# Patient Record
Sex: Female | Born: 1965
Health system: Southern US, Community
[De-identification: ages and names within clinical notes are randomized; demographics above are authoritative.]

## PROBLEM LIST (undated history)

## (undated) DIAGNOSIS — I7300 Raynaud's syndrome without gangrene: Secondary | ICD-10-CM

## (undated) DIAGNOSIS — Z8619 Personal history of other infectious and parasitic diseases: Secondary | ICD-10-CM

## (undated) DIAGNOSIS — Z0000 Encounter for general adult medical examination without abnormal findings: Secondary | ICD-10-CM

## (undated) DIAGNOSIS — E7800 Pure hypercholesterolemia, unspecified: Secondary | ICD-10-CM

## (undated) DIAGNOSIS — R112 Nausea with vomiting, unspecified: Secondary | ICD-10-CM

## (undated) DIAGNOSIS — G4700 Insomnia, unspecified: Secondary | ICD-10-CM

## (undated) DIAGNOSIS — N6092 Unspecified benign mammary dysplasia of left breast: Secondary | ICD-10-CM

## (undated) DIAGNOSIS — G4730 Sleep apnea, unspecified: Secondary | ICD-10-CM

## (undated) DIAGNOSIS — M722 Plantar fascial fibromatosis: Secondary | ICD-10-CM

## (undated) DIAGNOSIS — R011 Cardiac murmur, unspecified: Secondary | ICD-10-CM

## (undated) DIAGNOSIS — B001 Herpesviral vesicular dermatitis: Secondary | ICD-10-CM

## (undated) DIAGNOSIS — R202 Paresthesia of skin: Secondary | ICD-10-CM

## (undated) DIAGNOSIS — Z9889 Other specified postprocedural states: Secondary | ICD-10-CM

## (undated) DIAGNOSIS — M545 Low back pain: Secondary | ICD-10-CM

## (undated) HISTORY — DX: Encounter for general adult medical examination without abnormal findings: Z00.00

## (undated) HISTORY — PX: OTHER SURGICAL HISTORY: SHX169

## (undated) HISTORY — DX: Raynaud's syndrome without gangrene: I73.00

## (undated) HISTORY — DX: Plantar fascial fibromatosis: M72.2

## (undated) HISTORY — PX: BREAST EXCISIONAL BIOPSY: SUR124

## (undated) HISTORY — DX: Sleep apnea, unspecified: G47.30

## (undated) HISTORY — DX: Insomnia, unspecified: G47.00

## (undated) HISTORY — DX: Unspecified benign mammary dysplasia of left breast: N60.92

## (undated) HISTORY — DX: Herpesviral vesicular dermatitis: B00.1

## (undated) HISTORY — DX: Cardiac murmur, unspecified: R01.1

## (undated) HISTORY — DX: Paresthesia of skin: R20.2

## (undated) HISTORY — DX: Low back pain: M54.5

## (undated) HISTORY — DX: Personal history of other infectious and parasitic diseases: Z86.19

## (undated) HISTORY — DX: Pure hypercholesterolemia, unspecified: E78.00

---
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
## 1997-11-01 ENCOUNTER — Inpatient Hospital Stay (HOSPITAL_COMMUNITY): Admission: AD | Admit: 1997-11-01 | Discharge: 1997-11-01 | Payer: Self-pay | Admitting: Obstetrics and Gynecology

## 1998-01-15 ENCOUNTER — Ambulatory Visit (HOSPITAL_COMMUNITY): Admission: RE | Admit: 1998-01-15 | Discharge: 1998-01-15 | Payer: Self-pay | Admitting: Obstetrics and Gynecology

## 1998-03-09 ENCOUNTER — Inpatient Hospital Stay (HOSPITAL_COMMUNITY): Admission: AD | Admit: 1998-03-09 | Discharge: 1998-03-14 | Payer: Self-pay | Admitting: Obstetrics and Gynecology

## 1998-03-27 ENCOUNTER — Encounter (HOSPITAL_COMMUNITY): Admission: RE | Admit: 1998-03-27 | Discharge: 1998-06-25 | Payer: Self-pay | Admitting: Obstetrics and Gynecology

## 1998-08-26 ENCOUNTER — Ambulatory Visit (HOSPITAL_COMMUNITY): Admission: RE | Admit: 1998-08-26 | Discharge: 1998-08-26 | Payer: Self-pay | Admitting: Obstetrics and Gynecology

## 1998-08-26 ENCOUNTER — Encounter: Payer: Self-pay | Admitting: Obstetrics and Gynecology

## 1999-07-23 ENCOUNTER — Other Ambulatory Visit: Admission: RE | Admit: 1999-07-23 | Discharge: 1999-07-23 | Payer: Self-pay | Admitting: Obstetrics and Gynecology

## 2000-08-28 ENCOUNTER — Other Ambulatory Visit: Admission: RE | Admit: 2000-08-28 | Discharge: 2000-08-28 | Payer: Self-pay | Admitting: Obstetrics and Gynecology

## 2000-09-07 ENCOUNTER — Encounter: Payer: Self-pay | Admitting: Obstetrics and Gynecology

## 2000-09-07 ENCOUNTER — Ambulatory Visit (HOSPITAL_COMMUNITY): Admission: RE | Admit: 2000-09-07 | Discharge: 2000-09-07 | Payer: Self-pay | Admitting: Obstetrics and Gynecology

## 2001-12-11 ENCOUNTER — Other Ambulatory Visit: Admission: RE | Admit: 2001-12-11 | Discharge: 2001-12-11 | Payer: Self-pay | Admitting: Obstetrics and Gynecology

## 2003-05-15 ENCOUNTER — Encounter: Admission: RE | Admit: 2003-05-15 | Discharge: 2003-05-15 | Payer: Self-pay | Admitting: Internal Medicine

## 2003-09-24 ENCOUNTER — Encounter (INDEPENDENT_AMBULATORY_CARE_PROVIDER_SITE_OTHER): Payer: Self-pay | Admitting: *Deleted

## 2003-11-06 ENCOUNTER — Other Ambulatory Visit: Admission: RE | Admit: 2003-11-06 | Discharge: 2003-11-06 | Payer: Self-pay | Admitting: Obstetrics and Gynecology

## 2004-12-07 ENCOUNTER — Other Ambulatory Visit: Admission: RE | Admit: 2004-12-07 | Discharge: 2004-12-07 | Payer: Self-pay | Admitting: Obstetrics and Gynecology

## 2005-04-05 ENCOUNTER — Ambulatory Visit: Payer: Self-pay | Admitting: Internal Medicine

## 2005-04-07 ENCOUNTER — Ambulatory Visit (HOSPITAL_BASED_OUTPATIENT_CLINIC_OR_DEPARTMENT_OTHER): Admission: RE | Admit: 2005-04-07 | Discharge: 2005-04-07 | Payer: Self-pay | Admitting: Podiatry

## 2005-04-07 ENCOUNTER — Ambulatory Visit (HOSPITAL_COMMUNITY): Admission: RE | Admit: 2005-04-07 | Discharge: 2005-04-07 | Payer: Self-pay | Admitting: Podiatry

## 2005-08-19 ENCOUNTER — Encounter: Admission: RE | Admit: 2005-08-19 | Discharge: 2005-08-19 | Payer: Self-pay | Admitting: Obstetrics and Gynecology

## 2005-09-05 ENCOUNTER — Encounter: Admission: RE | Admit: 2005-09-05 | Discharge: 2005-09-05 | Payer: Self-pay | Admitting: Obstetrics and Gynecology

## 2005-11-29 ENCOUNTER — Encounter: Admission: RE | Admit: 2005-11-29 | Discharge: 2006-01-04 | Payer: Self-pay | Admitting: Podiatry

## 2005-12-02 ENCOUNTER — Ambulatory Visit: Payer: Self-pay | Admitting: Family Medicine

## 2006-08-11 ENCOUNTER — Ambulatory Visit: Payer: Self-pay | Admitting: Internal Medicine

## 2006-08-11 ENCOUNTER — Encounter (INDEPENDENT_AMBULATORY_CARE_PROVIDER_SITE_OTHER): Payer: Self-pay | Admitting: *Deleted

## 2006-08-11 LAB — CONVERTED CEMR LAB
AST: 26 units/L (ref 0–37)
Albumin: 4.5 g/dL (ref 3.5–5.2)
CO2: 29 meq/L (ref 19–32)
Chloride: 105 meq/L (ref 96–112)
Cholesterol: 185 mg/dL (ref 0–200)
Creatinine, Ser: 0.8 mg/dL (ref 0.4–1.2)
Eosinophils Relative: 1.5 % (ref 0.0–5.0)
GFR calc Af Amer: 101 mL/min
GFR calc non Af Amer: 84 mL/min
Glucose, Bld: 94 mg/dL (ref 70–99)
HDL: 78.7 mg/dL (ref >39.0)
Hemoglobin: 14.3 g/dL (ref 12.0–15.0)
MCHC: 33.3 g/dL (ref 30.0–36.0)
Monocytes Relative: 11 % (ref 3.0–11.0)
Neutrophils Relative %: 65.7 % (ref 43.0–77.0)
Platelets: 240 10*3/uL (ref 150–400)
Potassium: 3.9 meq/L (ref 3.5–5.1)
RBC count: 4.69 10*6/uL
Sodium: 140 meq/L (ref 135–145)
TSH: 0.74 microintl units/mL
TSH: 0.74 microintl units/mL (ref 0.35–5.50)
Total Bilirubin: 1.1 mg/dL (ref 0.3–1.2)
Total CHOL/HDL Ratio: 2.4
Total Protein: 7.8 g/dL (ref 6.0–8.3)
Triglycerides: 53 mg/dL (ref 0–149)
WBC, blood: 5.2 10*3/uL
WBC: 5.2 10*3/uL (ref 4.5–10.5)

## 2006-09-13 ENCOUNTER — Ambulatory Visit (HOSPITAL_COMMUNITY): Admission: RE | Admit: 2006-09-13 | Discharge: 2006-09-13 | Payer: Self-pay | Admitting: Obstetrics and Gynecology

## 2007-10-03 ENCOUNTER — Ambulatory Visit (HOSPITAL_COMMUNITY): Admission: RE | Admit: 2007-10-03 | Discharge: 2007-10-03 | Payer: Self-pay | Admitting: Obstetrics and Gynecology

## 2007-11-29 ENCOUNTER — Encounter (INDEPENDENT_AMBULATORY_CARE_PROVIDER_SITE_OTHER): Payer: Self-pay | Admitting: *Deleted

## 2007-11-29 DIAGNOSIS — I7300 Raynaud's syndrome without gangrene: Secondary | ICD-10-CM

## 2007-11-29 DIAGNOSIS — E7800 Pure hypercholesterolemia, unspecified: Secondary | ICD-10-CM

## 2007-11-29 DIAGNOSIS — Z8679 Personal history of other diseases of the circulatory system: Secondary | ICD-10-CM | POA: Insufficient documentation

## 2007-11-29 DIAGNOSIS — Z9089 Acquired absence of other organs: Secondary | ICD-10-CM

## 2007-11-29 DIAGNOSIS — M722 Plantar fascial fibromatosis: Secondary | ICD-10-CM

## 2007-11-30 ENCOUNTER — Ambulatory Visit: Payer: Self-pay | Admitting: Internal Medicine

## 2007-11-30 DIAGNOSIS — F411 Generalized anxiety disorder: Secondary | ICD-10-CM

## 2007-11-30 DIAGNOSIS — G4720 Circadian rhythm sleep disorder, unspecified type: Secondary | ICD-10-CM | POA: Insufficient documentation

## 2007-12-03 LAB — CONVERTED CEMR LAB
Free T4: 1 ng/dL (ref 0.6–1.6)
TSH: 1.21 microintl units/mL (ref 0.35–5.50)

## 2008-01-16 ENCOUNTER — Telehealth (INDEPENDENT_AMBULATORY_CARE_PROVIDER_SITE_OTHER): Payer: Self-pay | Admitting: *Deleted

## 2008-01-16 ENCOUNTER — Ambulatory Visit: Payer: Self-pay | Admitting: Internal Medicine

## 2008-04-14 ENCOUNTER — Ambulatory Visit: Payer: Self-pay | Admitting: Internal Medicine

## 2008-04-28 ENCOUNTER — Encounter (INDEPENDENT_AMBULATORY_CARE_PROVIDER_SITE_OTHER): Payer: Self-pay | Admitting: *Deleted

## 2008-10-08 ENCOUNTER — Ambulatory Visit (HOSPITAL_COMMUNITY): Admission: RE | Admit: 2008-10-08 | Discharge: 2008-10-08 | Payer: Self-pay | Admitting: Obstetrics and Gynecology

## 2009-03-02 ENCOUNTER — Telehealth (INDEPENDENT_AMBULATORY_CARE_PROVIDER_SITE_OTHER): Payer: Self-pay | Admitting: *Deleted

## 2009-03-03 ENCOUNTER — Ambulatory Visit: Payer: Self-pay | Admitting: Internal Medicine

## 2009-03-03 DIAGNOSIS — R5381 Other malaise: Secondary | ICD-10-CM

## 2009-03-03 DIAGNOSIS — R5383 Other fatigue: Secondary | ICD-10-CM

## 2009-03-03 DIAGNOSIS — R002 Palpitations: Secondary | ICD-10-CM | POA: Insufficient documentation

## 2009-03-09 ENCOUNTER — Encounter (INDEPENDENT_AMBULATORY_CARE_PROVIDER_SITE_OTHER): Payer: Self-pay | Admitting: *Deleted

## 2009-09-29 ENCOUNTER — Ambulatory Visit: Payer: Self-pay | Admitting: Family Medicine

## 2009-09-29 DIAGNOSIS — B354 Tinea corporis: Secondary | ICD-10-CM | POA: Insufficient documentation

## 2009-10-14 ENCOUNTER — Encounter: Admission: RE | Admit: 2009-10-14 | Discharge: 2009-10-14 | Payer: Self-pay | Admitting: Obstetrics and Gynecology

## 2010-01-15 ENCOUNTER — Telehealth (INDEPENDENT_AMBULATORY_CARE_PROVIDER_SITE_OTHER): Payer: Self-pay | Admitting: *Deleted

## 2010-08-15 LAB — CONVERTED CEMR LAB
AST: 25 units/L (ref 0–37)
Albumin: 4.5 g/dL (ref 3.5–5.2)
BUN: 12 mg/dL (ref 6–23)
Basophils Absolute: 0.1 10*3/uL (ref 0.0–0.1)
Basophils Relative: 1.4 % (ref 0.0–3.0)
Bilirubin, Direct: 0.2 mg/dL (ref 0.0–0.3)
Chloride: 102 meq/L (ref 96–112)
Cholesterol: 173 mg/dL (ref 0–200)
Eosinophils Absolute: 0.1 10*3/uL (ref 0.0–0.7)
Eosinophils Relative: 1.2 % (ref 0.0–5.0)
GFR calc non Af Amer: 72 mL/min
Glucose, Bld: 92 mg/dL (ref 70–99)
HCT: 44.1 % (ref 36.0–46.0)
HDL: 74 mg/dL (ref >39.0)
Hemoglobin: 14.7 g/dL (ref 12.0–15.0)
LDL Cholesterol: 89 mg/dL (ref 0–99)
Lymphocytes Relative: 15.9 % (ref 12.0–46.0)
Lymphocytes Relative: 17.9 % (ref 12.0–46.0)
Lymphs Abs: 1.2 10*3/uL (ref 0.7–4.0)
MCHC: 33.2 g/dL (ref 30.0–36.0)
MCHC: 35 g/dL (ref 30.0–36.0)
Monocytes Absolute: 0.7 10*3/uL (ref 0.1–1.0)
Monocytes Relative: 9.8 % (ref 3.0–12.0)
Neutro Abs: 3.8 10*3/uL (ref 1.4–7.7)
Neutro Abs: 4.7 10*3/uL (ref 1.4–7.7)
Neutrophils Relative %: 70.8 % (ref 43.0–77.0)
Neutrophils Relative %: 70.9 % (ref 43.0–77.0)
RBC: 4.43 M/uL (ref 3.87–5.11)
RDW: 11.5 % (ref 11.5–14.6)
TSH: 0.7 microintl units/mL (ref 0.35–5.50)
Total Bilirubin: 1.2 mg/dL (ref 0.3–1.2)
Total Protein: 7.6 g/dL (ref 6.0–8.3)
VLDL: 10 mg/dL (ref 0–40)
WBC: 5.4 10*3/uL (ref 4.5–10.5)
WBC: 6.7 10*3/uL (ref 4.5–10.5)

## 2010-08-17 NOTE — Progress Notes (Signed)
Summary: spot on leg  Phone Note Call from Patient Call back at [phone]   Caller: Patient Summary of Call: pt was seen back in march for a spot on her leg. she was given cream and that seems to be helping her for a little while. she says that the spot will go away for a couple of days and then come back. pt wants to know if she can have the cream refilled of if she needs and office visit. she will be going [place] of town on [place] so she needs to know something today. Initial call taken by: Lavell Islam,  January 15, 2010 8:49 AM  Follow-up for Phone Call        ok to refill cream- will need OV upon return from her trip Follow-up by: Neena Rhymes MD,  January 15, 2010 3:21 PM  Additional Follow-up for Phone Call Additional follow up Details #1::        [facility] at [facility] [address] and [facility] for patient to pick up. Additional Follow-up by: Barnie Mort,  January 15, 2010 4:04 PM    Prescriptions: LOTRISONE 1-0.05 % CREA (CLOTRIMAZOLE-BETAMETHASONE) apply two times a day  #30g x 0   Entered by:   Doristine Devoid   Authorized by:   Neena Rhymes MD   Signed by:   Doristine Devoid on 01/15/2010   Method used:   Historical   RxID:   [id number]

## 2010-08-17 NOTE — Assessment & Plan Note (Signed)
Summary: spot on left thigh area//spider bite??//lch   Vital Signs:  Patient profile:   45 year old female Height:      65.25 inches Weight:      132 pounds BMI:     21.88 Temp:     98.1 degrees F oral Pulse rate:   72 / minute Pulse rhythm:   regular BP sitting:   122 / 80  (left arm) Cuff size:   regular  Vitals Entered By: Army Fossa CMA (September 29, 2009 4:05 PM) CC: Pt here for possible spider bite on her left thigh that itches, noticied it last friday., Rash   History of Present Illness:  Rash      This is a 45 year old woman who presents with Rash.  The symptoms began 5 days ago.  The patient complains of papules.  The rash is located on the left thigh.  The patient denies the following symptoms: fever, headache, facial swelling, tongue swelling, burning, difficulty breathing, abdominal pain, nausea, vomiting, diarrhea, dizziness, sore throat, dysuria, eye symptoms, arthralgias, and vaginal discharge.  The patient reports a history of recent travel.  Pt tried otc fungal cream with no relief.     Current Medications (verified): 1)  Aleve 220 Mg  Tabs (Naproxen Sodium) .Marland Kitchen.. 1 By Mouth As Needed 2)  Ambien Cr 12.5 Mg  Tbcr (Zolpidem Tartrate) .... 1/2 Npill Q 3rd Night Prn 3)  Lotrisone 1-0.05 % Crea (Clotrimazole-Betamethasone) .... Apply Two Times A Day 4)  Doxycycline Hyclate 100 Mg Caps (Doxycycline Hyclate) .Marland Kitchen.. 1 By Mouth Two Times A Day  Allergies: 1)  ! Percocet 2)  ! Vicodin  Past History:  Past medical, surgical, family and social histories (including risk factors) reviewed for relevance to current acute and chronic problems.  Past Medical History: Reviewed history from 03/03/2009 and no changes required. 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 Caesarean section; G1  P1 TONSILLECTOMY AND ADENOIDECTOMY, HX OF (ICD-V45.79) Uterine Fibroidectomy @ C section; Surgery for Plantar Fasciitis  Family History: Reviewed history from 03/03/2009 and no changes required. patient adopted  maternal uncle died  @ age 9 MI maternal grandmother cervical cancer, pacemaker,CHF mother cva brother diabetes ; 1/2 bro depression  Social History: Reviewed history from 03/03/2009 and no changes required. Former Smoker :quit 1993 Alcohol use-yes occasional Occupation: Palliative Care Nurse Regular exercise-yes: [employer]  Review of Systems      See HPI  Physical Exam  General:  Well-developed,well-nourished,in no acute distress; alert,appropriate and cooperative throughout examination Skin:  circular errythema with central clearing  Psych:  Oriented X3 and normally interactive.     Impression & Recommendations:  Problem # 1:  TINEA CORPORIS (ICD-110.5) lotrisone two times a day  call or rto if does not resolve  Complete Medication List: 1)  Aleve 220 Mg Tabs (Naproxen sodium) .Marland Kitchen.. 1 by mouth as needed 2)  Ambien Cr 12.5 Mg Tbcr (Zolpidem tartrate) .... 1/2 npill q 3rd night prn 3)  Lotrisone 1-0.05 % Crea (Clotrimazole-betamethasone) .... Apply two times a day 4)  Doxycycline Hyclate 100 Mg Caps (Doxycycline hyclate) .Marland Kitchen.. 1 by mouth two times a day Prescriptions: DOXYCYCLINE HYCLATE 100 MG CAPS (DOXYCYCLINE HYCLATE) 1 by mouth two times a day  #20 x 0   Entered and Authorized by:   Loreen Freud DO   Signed by:   Loreen Freud DO on 09/29/2009  Method used:   Print then Give to Patient   RxID:   [id number] LOTRISONE 1-0.05 % CREA (CLOTRIMAZOLE-BETAMETHASONE) apply two times a day  #30g x 0   Entered and Authorized by:   Loreen Freud DO   Signed by:   Loreen Freud DO on 09/29/2009   Method used:   Electronically to        [facility] [address] (retail)       [address] [place], [place]  [address]       Ph:  [phone] or [phone]       Fax: [phone]   RxID:   [id number]

## 2010-11-11 ENCOUNTER — Other Ambulatory Visit: Payer: Self-pay | Admitting: Obstetrics and Gynecology

## 2010-11-11 DIAGNOSIS — Z1231 Encounter for screening mammogram for malignant neoplasm of breast: Secondary | ICD-10-CM

## 2010-11-25 ENCOUNTER — Ambulatory Visit
Admission: RE | Admit: 2010-11-25 | Discharge: 2010-11-25 | Disposition: A | Discharge status: Home / Self Care | Payer: 59 | Source: Ambulatory Visit | Attending: Obstetrics and Gynecology | Admitting: Obstetrics and Gynecology

## 2010-11-25 DIAGNOSIS — Z1231 Encounter for screening mammogram for malignant neoplasm of breast: Secondary | ICD-10-CM

## 2010-12-03 NOTE — Op Note (Signed)
NAMEEARLA, Katherine [name]                   ACCOUNT NO.:  [id number]   MEDICAL RECORD NO.:  [id number]          PATIENT TYPE:  OUT   LOCATION:  [facility]                         FACILITY:  [facility]   PHYSICIAN:  Ysidro Evert. Regal, D.P.M. DATE OF BIRTH:  1966-02-29   DATE OF PROCEDURE:  04/07/2005  DATE OF DISCHARGE:  04/07/2005                                 OPERATIVE REPORT   PREOPERATIVE DIAGNOSIS:  Plantar fasciitis, right heel.   POSTOPERATIVE DIAGNOSIS:  Plantar fasciitis, right heel.   ANESTHESIA:  IV sedation with local infiltration.   HEMOSTASIS:  Ankle tourniquet, right.   PROCEDURE:  Endoscopic release of medial fascial band, right.   SURGEON:  Ysidro Evert. Regal, D.P.M.   DESCRIPTION OF PROCEDURE:  The patient was brought to the operating room and  placed in the supine position on the operating room table.  The patient was  injected with a total of 3 mL of Xylocaine for a PT block and 10 mL of  Xylocaine and Marcaine mixture for a regional block to the area.  The  patient's right foot was prepped and draped utilizing the standard aseptic  technique.  The right tourniquet was inflated to 250 mmHg and the following  procedure was performed.  An endoscopic release of the medial fascial band,  right.   Attention was directed to the dorsal aspect of the right foot where an  approximately 2 cm linear incision was made for entry of the cannula on the  medial side.  The area underneath the plantar fascia was completely cleaned  and the cannula was introduced from the medial portal and then a lateral  entry point was allowed with a 1 mL incision to allow a lateral entry point  for the portal.  The 70-degree camera was inserted into the area and the  plantar fascia was visualized.  Utilizing a triangular blade from the medial  direction, the medial one-third of the plantar fascia was transected  utilizing the camera as a guide.  The wound was flushed with copious sterile  antibiotic solution.   There was found to be muscle belly exposure,  indicating a complete transection of the plantar fascia on the medial one-  third.  The cannula was removed and the wound was flushed with copious  Garamycin solution.  The wound edges were reapproximated utilizing #4-0  nylon and a sterile dressing was applied, after 5 mL of Xylocaine 1 mL and  dexamethasone was injected into the operative site.   The patient tolerated the surgery and anesthesia well and was returned from  the operating room in satisfactory condition, and was discharged by the  department of anesthesia.           ______________________________  Ysidro Evert. Regal, D.P.M.     NSR/MEDQ  D:  06/02/2005  T:  06/03/2005  Job:  [id number]

## 2011-01-18 ENCOUNTER — Encounter: Payer: Self-pay | Admitting: Internal Medicine

## 2011-01-18 ENCOUNTER — Ambulatory Visit (INDEPENDENT_AMBULATORY_CARE_PROVIDER_SITE_OTHER): Payer: 59 | Admitting: Internal Medicine

## 2011-01-18 ENCOUNTER — Encounter: Payer: Self-pay | Admitting: *Deleted

## 2011-01-18 ENCOUNTER — Telehealth: Payer: Self-pay | Admitting: *Deleted

## 2011-01-18 VITALS — BP 124/82 | HR 73 | Wt 127.6 lb

## 2011-01-18 DIAGNOSIS — B354 Tinea corporis: Secondary | ICD-10-CM

## 2011-01-18 DIAGNOSIS — R002 Palpitations: Secondary | ICD-10-CM

## 2011-01-18 DIAGNOSIS — R0789 Other chest pain: Secondary | ICD-10-CM

## 2011-01-18 DIAGNOSIS — R110 Nausea: Secondary | ICD-10-CM

## 2011-01-18 LAB — CBC WITH DIFFERENTIAL/PLATELET
Basophils Absolute: 0 10*3/uL (ref 0.0–0.1)
Eosinophils Relative: 0.5 % (ref 0.0–5.0)
MCV: 92.7 fl (ref 78.0–100.0)
Monocytes Absolute: 0.6 10*3/uL (ref 0.1–1.0)
Neutrophils Relative %: 75 % (ref 43.0–77.0)
RBC: 4.31 Mil/uL (ref 3.87–5.11)
RDW: 12.9 % (ref 11.5–14.6)
WBC: 6.3 10*3/uL (ref 4.5–10.5)

## 2011-01-18 LAB — COMPREHENSIVE METABOLIC PANEL
Alkaline Phosphatase: 56 U/L (ref 39–117)
Calcium: 9.3 mg/dL (ref 8.4–10.5)
Creatinine, Ser: 0.8 mg/dL (ref 0.4–1.2)
GFR: 77.27 mL/min (ref >60.00)
Glucose, Bld: 69 mg/dL — ABNORMAL LOW (ref 70–99)
Total Bilirubin: 0.5 mg/dL (ref 0.3–1.2)
Total Protein: 7.4 g/dL (ref 6.0–8.3)

## 2011-01-18 LAB — POCT URINALYSIS DIPSTICK
Bilirubin, UA: NEGATIVE
Glucose, UA: NEGATIVE
Ketones, UA: NEGATIVE
Nitrite, UA: NEGATIVE
Protein, UA: NEGATIVE
Urobilinogen, UA: 0.2

## 2011-01-18 LAB — TSH: TSH: 0.79 u[IU]/mL (ref 0.35–5.50)

## 2011-01-18 MED ORDER — ZOLPIDEM TARTRATE ER 12.5 MG PO TBCR: 12.5000 mg | EXTENDED_RELEASE_TABLET | Freq: Every evening | ORAL | Status: DC | PRN

## 2011-01-18 MED ORDER — CLOTRIMAZOLE-BETAMETHASONE 1-0.05 % EX CREA: 1.0000 "application " | TOPICAL_CREAM | Freq: Two times a day (BID) | CUTANEOUS | Status: DC

## 2011-01-18 NOTE — Assessment & Plan Note (Signed)
Skin lesion in the left leg, continue with Lotrisone, reassess in 2 weeks by PCP

## 2011-01-18 NOTE — Telephone Encounter (Signed)
Dr Ladona Ridgel read EKG- states he has no recommendations.

## 2011-01-18 NOTE — Progress Notes (Signed)
  Subjective:    Patient ID: Katherine [name], female    DOB: 1965/10/07, 45 y.o.   MRN: [id number]  HPI On 01-16-11 she felt tired, slightly dizzy, had mild nausea and a very mild shortness of breath when she walked at the  parking lot during the hot time of the day. She decided to leave work and went home . The next day, she felt slightly better but not 100%. At night she developed some palpitations described as her heart fluttering, she has about 3 episodes that lasted 20 seconds;  no syncope. Today, she still feel slightly nauseous and dizzy; she did have a single, 1 minute episode, of chest pressure, this morning rated in the scale from 0-10 as a #2. No associated symptoms, no radiation. 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 She was also recently diagnosed with premenopausal syndrome, was Rx  HRT but has not started it yet.  Past Medical History  Diagnosis Date  . Raynaud's syndrome   . Pure hypercholesterolemia   . Heart murmur   . Plantar fasciitis     Past Surgical History  Procedure Date  . Cesarean section   . Tonsillectomy and adenoidectomy   . Uterine fibroidectomy     @ c section  . Plantar fasciitis surgery      Social History: Former Smoker :quit 1993 Alcohol use-yes occasional Occupation: Palliative Care Nurse Regular exercise-yes: [employer]  Do  Review of Systems Mild chills without fever No  edema, recent airplane trips or leg pain. Nor rash anywhere except for few mosquito bites in the  legs and a rash that has not improved much with the use of Lotrisone. Mild headache today Denies fever, cough or sputum production. No pre-syncope     Objective:   Physical Exam  Constitutional: She is oriented to person, place, and time. She appears well-developed  and well-nourished. No distress.  HENT:  Head: Normocephalic and atraumatic.  Neck: No thyromegaly present.  Cardiovascular: Normal rate, regular rhythm and normal heart sounds.   No murmur heard. Pulmonary/Chest: Effort normal and breath sounds normal. No respiratory distress. She has no wheezes. She has no rales.  Abdominal:       Nondistended, soft, no mass, slightly tender in the lower abdomen bilaterally without mass or rebound.  Musculoskeletal: She exhibits no edema.       Calves symmetric and not tender  Neurological: She is alert and oriented to person, place, and time. No cranial nerve deficit.       Speech, gait and motor are intact  Skin: Skin is warm and dry. She is not diaphoretic.  Psychiatric:       No distress, but seems worried about her symptoms          Assessment & Plan:

## 2011-01-18 NOTE — Assessment & Plan Note (Addendum)
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 The symptoms are in the setting of high stress at work, working 60 hours a week for several weeks and not been able to sleep well. EKG show sinus rhythm, will  discussed with cardiology. (EKG ok , see phone note) Lower abd dyscomfort---- neg UA Plan: Labs Echocardiogram ER if symptoms appear Discussed  Treatment for anxiety, not ready to SSRIs RF sleep meds -------> rec to use them (has not been using them) Followup with PCP in 2 weeks

## 2011-01-20 ENCOUNTER — Telehealth: Payer: Self-pay | Admitting: Internal Medicine

## 2011-01-20 DIAGNOSIS — R002 Palpitations: Secondary | ICD-10-CM

## 2011-01-20 NOTE — Telephone Encounter (Signed)
Pt called says that she was supposed to be set up w/ 2-D echo was just checking status says Dr. Drue Novel wanted it done as soon as possible. There's no order in computer so please clarify if pt is supposed to have echo done.

## 2011-01-20 NOTE — Telephone Encounter (Signed)
Per Staff message from Dr.Paz schedule ECHO dx palpitations.

## 2011-01-20 NOTE — Telephone Encounter (Signed)
Thank you :)

## 2011-01-21 ENCOUNTER — Telehealth: Payer: Self-pay | Admitting: *Deleted

## 2011-01-21 NOTE — Telephone Encounter (Signed)
Message copied by Leanne Lovely on Fri Jan 21, 2011  1:57 PM ------      Message from: Willow Ora E      Created: Fri Jan 21, 2011  1:47 PM       Advise patient, labs okay except for a slightly low blood sugar, plan is the same

## 2011-01-21 NOTE — Telephone Encounter (Signed)
Message left for patient to return my call.  

## 2011-01-24 NOTE — Telephone Encounter (Signed)
Pt is aware.  

## 2011-01-27 ENCOUNTER — Ambulatory Visit (HOSPITAL_COMMUNITY): Payer: 59 | Attending: Internal Medicine | Admitting: Radiology

## 2011-01-27 ENCOUNTER — Other Ambulatory Visit (HOSPITAL_COMMUNITY): Payer: Self-pay | Admitting: Internal Medicine

## 2011-01-27 DIAGNOSIS — R002 Palpitations: Secondary | ICD-10-CM

## 2011-01-27 DIAGNOSIS — R072 Precordial pain: Secondary | ICD-10-CM | POA: Insufficient documentation

## 2011-01-27 DIAGNOSIS — I059 Rheumatic mitral valve disease, unspecified: Secondary | ICD-10-CM | POA: Insufficient documentation

## 2011-01-27 DIAGNOSIS — R0789 Other chest pain: Secondary | ICD-10-CM

## 2011-01-27 DIAGNOSIS — I079 Rheumatic tricuspid valve disease, unspecified: Secondary | ICD-10-CM | POA: Insufficient documentation

## 2011-01-27 DIAGNOSIS — R011 Cardiac murmur, unspecified: Secondary | ICD-10-CM | POA: Insufficient documentation

## 2011-02-03 ENCOUNTER — Telehealth: Payer: Self-pay | Admitting: *Deleted

## 2011-02-03 ENCOUNTER — Telehealth: Payer: Self-pay | Admitting: Internal Medicine

## 2011-02-03 MED ORDER — LORAZEPAM 0.5 MG PO TABS: 0.5000 mg | ORAL_TABLET | Freq: Three times a day (TID) | ORAL | Status: AC

## 2011-02-03 NOTE — Telephone Encounter (Signed)
Pt aware of information.  

## 2011-02-03 NOTE — Telephone Encounter (Signed)
A safer alternative would be  Lorazepam  0.5 mg taken if she awakens. This would not have risk of addiction/habituation  and automated  behavior seen with Ambien. Dispense  # 30 as needed each night  If she awakens early. I feel her pain meds ; we've ben in [employer] 3 months

## 2011-02-03 NOTE — Telephone Encounter (Signed)
Message copied by Leanne Lovely on Thu Feb 03, 2011  1:48 PM ------      Message from: Willow Ora E      Created: Thu Feb 03, 2011 12:32 PM       Advise patient:      Echocardiogram is normal, plan is the same

## 2011-02-03 NOTE — Telephone Encounter (Signed)
Message left for patient to return my call.  

## 2011-02-03 NOTE — Telephone Encounter (Signed)
Spoke w/ pt says that she is doing all that recommended says that she is currently working w/ [employer] go live and has no trouble falling asleep but wakes up after 3-4 hours and has trouble going back due to stress of work and feels like mind doesn't shut off. Is willing to go to sleep specialist but a this time with work schedule working about 60 hours a week right now it will be impossible but will call when she does get a moment to where she can go informed that if any other suggestions will let her know.

## 2011-02-03 NOTE — Telephone Encounter (Signed)
Addended by: Doristine Devoid on: 02/03/2011 05:01 PM   Modules accepted: Orders

## 2011-02-03 NOTE — Telephone Encounter (Signed)
To prevent sleep dysfunction follow these instructions for sleep hygiene. Do not read, watch TV, or eat in bed. Do not get into bed until you are ready to turn off the light &  to go to sleep. Do not ingest stimulants ( decongestants, diet pills, nicotine, caffeine) after the evening meal.  If practicing these measures  &  a sleeping pill is still needed more than every 3 rd night, an  evaluation  should be completed by a sleep specialist ( Ex Dr Marylou Flesher) to rule out some other possibly significant underlying issue which would  require definitive treatment.

## 2011-02-03 NOTE — Telephone Encounter (Signed)
Pt aware of echo

## 2011-02-03 NOTE — Telephone Encounter (Signed)
Spoke w/ pt mention she was unable to get ambien refill because pharmacy stated that is was to early to filled last time she got rx filled they only filled it for 15 tabs when we sent in for 30 informed pt that this was due to insurance not anything that we indicated. Pt says she is having to take medication nightly says she has a lot going and having trouble sleeping would like to know if there's something else that would be ok for her to take nightly.

## 2011-12-28 ENCOUNTER — Other Ambulatory Visit (HOSPITAL_COMMUNITY): Payer: Self-pay | Admitting: Obstetrics and Gynecology

## 2011-12-28 DIAGNOSIS — Z1231 Encounter for screening mammogram for malignant neoplasm of breast: Secondary | ICD-10-CM

## 2012-01-20 ENCOUNTER — Ambulatory Visit (HOSPITAL_COMMUNITY)
Admission: RE | Admit: 2012-01-20 | Discharge: 2012-01-20 | Disposition: A | Discharge status: Home / Self Care | Payer: 59 | Source: Ambulatory Visit | Attending: Obstetrics and Gynecology | Admitting: Obstetrics and Gynecology

## 2012-01-20 DIAGNOSIS — Z1231 Encounter for screening mammogram for malignant neoplasm of breast: Secondary | ICD-10-CM | POA: Insufficient documentation

## 2012-05-21 ENCOUNTER — Ambulatory Visit (INDEPENDENT_AMBULATORY_CARE_PROVIDER_SITE_OTHER): Payer: 59 | Admitting: Internal Medicine

## 2012-05-21 ENCOUNTER — Encounter: Payer: Self-pay | Admitting: Internal Medicine

## 2012-05-21 VITALS — BP 104/66 | HR 57 | Wt 132.0 lb

## 2012-05-21 DIAGNOSIS — G479 Sleep disorder, unspecified: Secondary | ICD-10-CM

## 2012-05-21 DIAGNOSIS — M2550 Pain in unspecified joint: Secondary | ICD-10-CM

## 2012-05-21 MED ORDER — MELOXICAM 7.5 MG PO TABS: 7.5000 mg | ORAL_TABLET | Freq: Every day | ORAL | Status: DC

## 2012-05-21 MED ORDER — ZOLPIDEM TARTRATE ER 12.5 MG PO TBCR: EXTENDED_RELEASE_TABLET | ORAL | Status: DC

## 2012-05-21 NOTE — Progress Notes (Signed)
Subjective:    Patient ID: Katherine [name], female    DOB: 1966-04-13, 46 y.o.   MRN: [id number]  HPI #1 Extremity pain Location: Right hand and right hip Onset: right hand- 1 year ago. R hip- 1 month Trigger/injury: right hip trigger- no specific trigger.  Right hand- no injury. Right hand 2rd digit- triggers Pain quality: dull and constant in index & 2nd R fingers;pain in left thenar imminence occasionally Pain severity: 2/10 Duration: constant in fingers Exacerbating factors: right hip- walking for extended period Treatment/response: Ibuprofen/Advil with minimal relief. Glucosamine has not been of benefit. A supplement she she got from health food store has helped some  Significant past history includes some Raynaud's in the right hand chiefly.   #2 Sleep Dysfunction:   Onset: for several years Pattern: everyday  Difficulty going to sleep: no Frequent awakening:yes Early awakening:yes. Up at 2-3am Nightmares:no Abnormal leg movement: ? 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 Advil PM with decent relief but does not get more than 4 hours of sleep.   She does grind her teeth and has a prosthesis.      Review of Systems Constitutional: no fever, chills, sweats. 5# increase in weight due to intake of caffeine to stay up during daytime  Musculoskeletal:no  muscle cramps or pain. Some   joint  Redness & swelling of R index finger  Skin:no rash, color change Neuro: no weakness; incontinence (stool/urine);  numbness and tingling Heme:no lymphadenopathy; abnormal bruising or bleeding      Objective:   Physical Exam Gen.:  Thin but well-nourished in appearance. Alert, appropriate and cooperative throughout exam. Head: Normocephalic without obvious abnormalities Eyes: No corneal or conjunctival inflammation noted.  Extraocular motion intact. Vision grossly normal.  Nose: External nasal exam reveals no deformity or inflammation. Nasal mucosa are pink and moist. No lesions or exudates noted.  Mouth: Oral mucosa and oropharynx reveal no lesions or exudates. Teeth in good repair. Neck: No deformities, masses, or tenderness noted. Range of motion &. Thyroid  normal. Lungs: Normal respiratory effort; chest expands symmetrically. Lungs are clear to auscultation without rales, wheezes, or increased work of breathing. Heart: Normal rate and rhythm. Normal S1 and S2. No gallop, click, or rub. S4 w/o murmur. Abdomen: Bowel sounds normal; abdomen soft and nontender. No masses, organomegaly or hernias noted. Musculoskeletal/extremities: No deformity or scoliosis noted of  the thoracic or lumbar spine. No clubbing, cyanosis, edema, or deformity noted. Range of motion  normal .Tone & strength  normal.Joints normal. Nail health  Good. Minor knee crepitus , R > L Vascular: Carotid, radial artery, dorsalis pedis and  posterior tibial pulses are full and equal. No bruits present. Neurologic: Alert and oriented x3. Deep tendon reflexes symmetrical and normal.          Skin: Intact without suspicious lesions or rashes. Lymph: No cervical, axillary lymphadenopathy present. Psych: Mood and affect are normal. Normally interactive  Assessment & Plan:  #1 markedly asymmetric joint symptoms in the context of Raynaud's phenomena. Some triggering second right finger. Pain of the left thumb suggested degenerative joint changes.  #2  sleep dysfunction with snoring. Despite her body habitus; sleep apnea must be ruled out.  Plan: See orders and recommendations

## 2012-05-21 NOTE — Addendum Note (Signed)
Addended by: Maurice Small on: 05/21/2012 05:25 PM   Modules accepted: Orders

## 2012-05-21 NOTE — Patient Instructions (Addendum)
Review and correct the record as indicated. Please share record with all medical staff seen.  Please  schedule fasting Labs : sed rate, RA factor,hepatic panel, CBC & dif.PLEASE BRING THESE INSTRUCTIONS TO FOLLOW UP  LAB APPOINTMENT.This will guarantee correct labs are drawn, eliminating need for repeat blood sampling ( needle sticks ! ). Diagnoses /Codes: sleep disorder; arthragias

## 2012-05-23 ENCOUNTER — Other Ambulatory Visit (INDEPENDENT_AMBULATORY_CARE_PROVIDER_SITE_OTHER): Payer: 59

## 2012-05-23 DIAGNOSIS — G479 Sleep disorder, unspecified: Secondary | ICD-10-CM

## 2012-05-23 DIAGNOSIS — M2550 Pain in unspecified joint: Secondary | ICD-10-CM

## 2012-05-23 LAB — CBC WITH DIFFERENTIAL/PLATELET
Basophils Absolute: 0 10*3/uL (ref 0.0–0.1)
HCT: 41.3 % (ref 36.0–46.0)
Hemoglobin: 13.7 g/dL (ref 12.0–15.0)
Lymphs Abs: 0.9 10*3/uL (ref 0.7–4.0)
MCV: 92.9 fl (ref 78.0–100.0)
Monocytes Absolute: 0.5 10*3/uL (ref 0.1–1.0)
Neutro Abs: 3.3 10*3/uL (ref 1.4–7.7)
Platelets: 207 10*3/uL (ref 150.0–400.0)
RDW: 12.5 % (ref 11.5–14.6)

## 2012-05-23 LAB — HEPATIC FUNCTION PANEL
AST: 22 U/L (ref 0–37)
Alkaline Phosphatase: 51 U/L (ref 39–117)
Bilirubin, Direct: 0.1 mg/dL (ref 0.0–0.3)
Total Bilirubin: 0.7 mg/dL (ref 0.3–1.2)
Total Protein: 7.5 g/dL (ref 6.0–8.3)

## 2012-05-24 LAB — RHEUMATOID FACTOR: Rhuematoid fact SerPl-aCnc: 29 IU/mL — ABNORMAL HIGH (ref <=14)

## 2012-05-30 ENCOUNTER — Telehealth: Payer: Self-pay | Admitting: Internal Medicine

## 2012-05-30 ENCOUNTER — Encounter: Payer: Self-pay | Admitting: Internal Medicine

## 2012-05-30 NOTE — Telephone Encounter (Signed)
called to scheduled lm to call back & schedule lab work

## 2012-06-06 NOTE — Telephone Encounter (Signed)
pt called to scheduled coming in 11.21.13 at 10am

## 2012-06-07 ENCOUNTER — Other Ambulatory Visit (INDEPENDENT_AMBULATORY_CARE_PROVIDER_SITE_OTHER): Payer: 59

## 2012-06-07 DIAGNOSIS — M2550 Pain in unspecified joint: Secondary | ICD-10-CM

## 2012-06-07 DIAGNOSIS — G479 Sleep disorder, unspecified: Secondary | ICD-10-CM

## 2012-06-08 ENCOUNTER — Other Ambulatory Visit: Payer: Self-pay | Admitting: Internal Medicine

## 2012-06-08 DIAGNOSIS — M2550 Pain in unspecified joint: Secondary | ICD-10-CM

## 2012-06-08 LAB — CYCLIC CITRUL PEPTIDE ANTIBODY, IGG: Cyclic Citrullin Peptide Ab: <2 U/mL (ref 0.0–5.0)

## 2012-06-09 ENCOUNTER — Encounter: Payer: Self-pay | Admitting: Internal Medicine

## 2012-06-11 ENCOUNTER — Other Ambulatory Visit: Payer: Self-pay | Admitting: Obstetrics and Gynecology

## 2012-06-30 ENCOUNTER — Other Ambulatory Visit: Payer: Self-pay | Admitting: Internal Medicine

## 2012-07-13 ENCOUNTER — Telehealth: Payer: Self-pay | Admitting: Internal Medicine

## 2012-07-13 NOTE — Telephone Encounter (Signed)
Faxed labs

## 2012-07-13 NOTE — Telephone Encounter (Signed)
Fleet Contras called office (now sure which office but has something to do with arthritis) her fax number is [phone] - records were faxed over to here but they didn't include arthritis labs- she needs these faxed to her. Sorry cannot be more help, amym

## 2012-10-24 ENCOUNTER — Encounter: Payer: Self-pay | Admitting: Lab

## 2012-10-25 ENCOUNTER — Ambulatory Visit (INDEPENDENT_AMBULATORY_CARE_PROVIDER_SITE_OTHER): Payer: 59 | Admitting: Internal Medicine

## 2012-10-25 ENCOUNTER — Encounter: Payer: Self-pay | Admitting: Internal Medicine

## 2012-10-25 VITALS — BP 110/70 | HR 57 | Temp 97.9°F | Wt 131.0 lb

## 2012-10-25 DIAGNOSIS — L720 Epidermal cyst: Secondary | ICD-10-CM

## 2012-10-25 DIAGNOSIS — L723 Sebaceous cyst: Secondary | ICD-10-CM

## 2012-10-25 NOTE — Patient Instructions (Addendum)
Elective surgical resection of the lesion should be pursued if there is an increase in tenderness/pain, size, or evidence of  cellulitis.

## 2012-10-25 NOTE — Progress Notes (Signed)
  Subjective:    Patient ID: Katherine [name], female    DOB: 1965-10-23, 47 y.o.   MRN: [id number]  HPI   Approximately one month ago she noted a lesion at the right upper medial thigh while applying lotion to her leg. She believes that this has increased in size.  She's had some aching discomfort in the right leg of question significance.    Review of Systems   She denies associated fever, chills, sweats, weight loss     Objective:   Physical Exam  She is thin but appears healthy and well-nourished. She appears younger than stated age.  She has no lymphadenopathy about the neck, axilla, or right inguinal area  She has no abdominal tenderness, organomegaly, masses  There is a 4 x 5 mm movable subcutaneous lesion in the right upper medial thigh. This is not visible with transillumination. The lesion is not attached to the subcutaneous tissues.  Homans sign is negative.        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 If the lesion increases in size or has associated pain or cellulitis; elective resection should be pursued.

## 2012-12-18 ENCOUNTER — Encounter: Payer: 59 | Admitting: Internal Medicine

## 2013-01-04 ENCOUNTER — Other Ambulatory Visit: Payer: Self-pay

## 2013-01-04 MED ORDER — ZALEPLON 10 MG PO CAPS: 10.0000 mg | ORAL_CAPSULE | ORAL | Status: DC | PRN

## 2013-01-07 MED ORDER — ZALEPLON 10 MG PO CAPS: 10.0000 mg | ORAL_CAPSULE | ORAL | Status: DC | PRN

## 2013-01-07 NOTE — Addendum Note (Signed)
Addended by: Lucille Passy C on: 01/07/2013 12:39 PM   Modules accepted: Orders

## 2013-02-04 ENCOUNTER — Other Ambulatory Visit (HOSPITAL_COMMUNITY): Payer: Self-pay | Admitting: Obstetrics and Gynecology

## 2013-02-04 DIAGNOSIS — Z1231 Encounter for screening mammogram for malignant neoplasm of breast: Secondary | ICD-10-CM

## 2013-02-15 ENCOUNTER — Ambulatory Visit (HOSPITAL_COMMUNITY)
Admission: RE | Admit: 2013-02-15 | Discharge: 2013-02-15 | Disposition: A | Discharge status: Home / Self Care | Payer: 59 | Source: Ambulatory Visit | Attending: Obstetrics and Gynecology | Admitting: Obstetrics and Gynecology

## 2013-02-15 DIAGNOSIS — Z1231 Encounter for screening mammogram for malignant neoplasm of breast: Secondary | ICD-10-CM | POA: Insufficient documentation

## 2013-02-18 ENCOUNTER — Other Ambulatory Visit: Payer: Self-pay | Admitting: Obstetrics and Gynecology

## 2013-02-18 DIAGNOSIS — R928 Other abnormal and inconclusive findings on diagnostic imaging of breast: Secondary | ICD-10-CM

## 2013-02-19 ENCOUNTER — Ambulatory Visit (INDEPENDENT_AMBULATORY_CARE_PROVIDER_SITE_OTHER): Payer: 59 | Admitting: Internal Medicine

## 2013-02-19 ENCOUNTER — Encounter: Payer: Self-pay | Admitting: Internal Medicine

## 2013-02-19 VITALS — BP 118/80 | HR 62 | Temp 98.0°F | Resp 12 | Wt 134.0 lb

## 2013-02-19 DIAGNOSIS — L959 Vasculitis limited to the skin, unspecified: Secondary | ICD-10-CM

## 2013-02-19 DIAGNOSIS — N390 Urinary tract infection, site not specified: Secondary | ICD-10-CM

## 2013-02-19 DIAGNOSIS — R51 Headache: Secondary | ICD-10-CM

## 2013-02-19 DIAGNOSIS — L988 Other specified disorders of the skin and subcutaneous tissue: Secondary | ICD-10-CM

## 2013-02-19 LAB — POCT URINALYSIS DIPSTICK
Bilirubin, UA: NEGATIVE
Glucose, UA: NEGATIVE
Spec Grav, UA: <=1.005

## 2013-02-19 MED ORDER — PHENAZOPYRIDINE HCL 200 MG PO TABS: 200.0000 mg | ORAL_TABLET | Freq: Three times a day (TID) | ORAL | Status: DC | PRN

## 2013-02-19 MED ORDER — TRAMADOL HCL 50 MG PO TABS: 50.0000 mg | ORAL_TABLET | Freq: Four times a day (QID) | ORAL | Status: DC | PRN

## 2013-02-19 MED ORDER — NITROFURANTOIN MONOHYD MACRO 100 MG PO CAPS: 100.0000 mg | ORAL_CAPSULE | Freq: Two times a day (BID) | ORAL | Status: DC

## 2013-02-19 NOTE — Patient Instructions (Addendum)
Drink as much nondairy fluids as possible. Avoid spicy foods or alcohol as  these may aggravate the prostate. Do not take decongestants. Avoid narcotics if possible. Dip gauze in  sterile saline and applied to the wound twice a day. Cover the wound with Telfa , non stick dressing  without any antibiotic ointment. The saline can be purchased at the drugstore or you can make your own .Boil cup of salt in a gallon of water. Store mixture  in a clean container.Report Warning  signs as discussed (red streaks, pus, fever, increasing pain).

## 2013-02-19 NOTE — Progress Notes (Signed)
Subjective:    Patient ID: Katherine [name], female    DOB: 1966-06-21, 47 y.o.   MRN: [id number]  HPI   As of 02/18/13 she has had malaise. She also felt as if she needed to urinate without frank urgency. There was some mild dysuria which improved with hydration. She is unable to tell whether she is having hematuria as she is having her menses. She has had some suprapubic tenderness. She's also had chills and a sense of queasiness.  She describes some frontal, dull headache without radiation.  Additionally she was bitten by an unknown vector over the left lower [place] The lesions were red and itchy and only slightly responsive to triple antibiotic  ointments    Review of Systems  She has had no associated blurred vision, double vision, or loss of vision. She denies facial pain or nasal purulence.  The chills has not been associated with fever or sweats.     Objective:   Physical Exam Gen.: Thin but healthy and well-nourished in appearance. Alert, appropriate and cooperative throughout exam.Appears younger than stated age  Head: Normocephalic without obvious abnormalities  Eyes: No corneal or conjunctival inflammation noted. Pupils equal round reactive to light and accommodation. FOV WNL. Extraocular motion intact. Vision grossly normal without lenses Ears: External  ear exam reveals no significant lesions or deformities. Canals clear .TMs normal. Hearing is grossly normal bilaterally. Nose: External nasal exam reveals no deformity or inflammation. Nasal mucosa are pink and moist. No lesions or exudates noted.   Mouth: Oral mucosa and oropharynx reveal no lesions or exudates. Teeth in good repair. Neck: No deformities, masses, or tenderness noted. Range of motion & Thyroid normal. Neck supple to passive range of motion Lungs: Normal respiratory effort; chest expands symmetrically. Lungs are clear to auscultation without rales, wheezes, or  increased work of breathing. Heart: Normal rate and rhythm. Normal S1 and S2. No gallop, click, or rub. No murmur. Abdomen: Bowel sounds normal; abdomen soft and nontender. No masses, organomegaly or hernias noted.Aorta palpable ; no AAA                               Musculoskeletal/extremities:  No clubbing, cyanosis, edema, or significant extremity  deformity noted. Range of motion normal .Tone & strength  Normal. Joints normal. Nail health good. Able to lie down & sit up w/o help. Negative SLR bilaterally to 90 degrees. No meningeal signs Vascular: Carotid, radial artery, dorsalis pedis and  posterior tibial pulses are full and equal. No bruits present. Neurologic: Alert and oriented x3. Deep tendon reflexes symmetrical and normal.         Skin: 2 erythematous lesions LLE; superior lesion blanches with pressure Lymph: No cervical, axillary lymphadenopathy present. Psych: Mood and affect are normal. 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 Clinically there is no suggestion of tick borne disease. This may be related to using the Triple Antibiotic ointment  #3 headache, no neurologic deficit  Plan: See orders and recommendations

## 2013-02-20 ENCOUNTER — Telehealth: Payer: Self-pay | Admitting: Internal Medicine

## 2013-02-20 NOTE — Telephone Encounter (Signed)
Noted  

## 2013-02-20 NOTE — Telephone Encounter (Signed)
Patient Information:  Caller Name: Shalin  Phone: [phone]  Patient: Katherine [name]  Gender: Female  DOB: 1966-01-18  Age: 47 Years  PCP: Marga Melnick  Pregnant: No  Office Follow Up:  Does the office need to follow up with this patient?: No  Instructions For The Office: N/A  RN Note:  Triage RN called Macrobid as ordered by Dr Alwyn Ren on 02/19/13 to [facility]; pt aware  Symptoms  Reason For Call & Symptoms: LMP 02/15/13 Pt is calling and states that she was dx with UTI on 02/19/13 in the office;  Macrobid was to be called into [facility] [phone] but it was not there; Triage RN reviewed in EPIC and noted that Macrobid 100mg  1 capsule by mouth twice daily #14 NR was ordered on 02/19/13  Reviewed Health History In EMR: N/[name]  Reviewed Medications In EMR: N/[name]  Reviewed Allergies In EMR: N/[name]  Reviewed Surgeries / Procedures: N/[name]  Date of Onset of Symptoms: Unknown OB / GYN:  LMP: 02/15/2013  Guideline(s) Used:  No Protocol Available - Information Only  Disposition Per Guideline:   Home Care  Reason For Disposition Reached:   Information only question and nurse able to answer  Advice Given:  N/[name]  Patient Will Follow Care Advice:  YES

## 2013-02-21 ENCOUNTER — Telehealth: Payer: Self-pay | Admitting: Internal Medicine

## 2013-02-21 DIAGNOSIS — R110 Nausea: Secondary | ICD-10-CM

## 2013-02-21 LAB — URINE CULTURE: Colony Count: >=100000

## 2013-02-21 MED ORDER — ONDANSETRON HCL 4 MG PO TABS: ORAL_TABLET | ORAL | Status: DC

## 2013-02-21 NOTE — Telephone Encounter (Signed)
LVM for patient that Rx has been sent to the [facility].

## 2013-02-21 NOTE — Telephone Encounter (Signed)
Hopp please advise  

## 2013-02-21 NOTE — Telephone Encounter (Signed)
Caller: Katherine [name]/Patient; Phone: [phone]; Reason for Call: Patient had called earlier and requested Zofran for nausea.  Dr.  Alwyn Ren saw patient in office 02/19/2013 for UTI.  She was awaiting a callback from the office concerning the Rx.  Now, instead of calling in Rx to [facility], please call in to [facility].  , [place], [place], [phone] per patient.  She is leaving work [employer]) at 17: 00.

## 2013-02-21 NOTE — Telephone Encounter (Signed)
Patient Information:  Caller Name: Katherine [name]  Phone: [phone]  Patient: Katherine [name]  Gender: Female  DOB: 29-Dec-1965  Age: 47 Years  PCP: Katherine [name]  Pregnant: No  Office Follow Up:  Does the office need to follow up with this patient?: Yes  Instructions For The Office: Requesting Zofran for nausea.  Please f/u with Katherine [name].  If no answer, states may leave [name] voicemail as she is at work.  RN Note:  Katherine [name] was in the office on 02/19/13 and dx'd with [name] UTI.  Started abx on 02/20/13.  Continuing to have some burning with urination.  Calling because she developed nausea today.  Has not vomited.  Requesting Zofran to be called in to [facility] [phone].  Triage completed and home care advice given.  Parameters reviewed concerning when to call back.  Symptoms  Reason For Call & Symptoms: Nausea  Reviewed Health History In EMR: Yes  Reviewed Medications In EMR: Yes  Reviewed Allergies In EMR: Yes  Reviewed Surgeries / Procedures: Yes  Date of Onset of Symptoms: 02/21/2013 OB / GYN:  LMP: 02/15/2013  Guideline(s) Used:  Nausea  Urination Pain - Female  Disposition Per Guideline:   Home Care  Reason For Disposition Reached:   Taking antibiotic < 3 days for UTI and painful urination not improved  Advice Given:  Call Back If:   You become worse.  Patient Will Follow Care Advice:  YES

## 2013-02-21 NOTE — Telephone Encounter (Signed)
Sorry ; already addressed once; please see if you can locate note #10 generic Zofran 4 mg 1 q 6 hrs prn

## 2013-02-21 NOTE — Telephone Encounter (Signed)
Generic Zofran 4 mg q 6 hrs prn # 8 OK.  Stay on clear liquids for 48-72 hours or until nausea resolved.This would include  jello, sherbert (NOT ice cream), Lipton's chicken noodle soup(NOT cream based soups),Gatorade Lite, flat Ginger ale (without High Fructose Corn Syrup),dry toast or crackers, baked potato.No milk , dairy or grease until bowels are formed.  Report increasing pain, fever or rectal bleeding Re evaluate if no better

## 2013-03-05 ENCOUNTER — Ambulatory Visit
Admission: RE | Admit: 2013-03-05 | Discharge: 2013-03-05 | Disposition: A | Discharge status: Home / Self Care | Payer: 59 | Source: Ambulatory Visit | Attending: Obstetrics and Gynecology | Admitting: Obstetrics and Gynecology

## 2013-03-05 DIAGNOSIS — R928 Other abnormal and inconclusive findings on diagnostic imaging of breast: Secondary | ICD-10-CM

## 2013-04-04 ENCOUNTER — Telehealth: Payer: Self-pay | Admitting: Internal Medicine

## 2013-04-04 DIAGNOSIS — I7300 Raynaud's syndrome without gangrene: Secondary | ICD-10-CM

## 2013-04-04 MED ORDER — NIFEDIPINE ER OSMOTIC RELEASE 30 MG PO TB24: 30.0000 mg | ORAL_TABLET | Freq: Every day | ORAL | Status: DC

## 2013-04-04 NOTE — Telephone Encounter (Signed)
Rx for procardia sent to [facility], pt made aware to monitor B/P while on this medication

## 2013-04-04 NOTE — Telephone Encounter (Signed)
Patient requesting a script for Procardia 30mg  XL to treat Raynauds.  Patient has history of heart murmur and palpitations, do you want to see her in the office and speak with her about this? If so, we can call and schedule.

## 2013-04-04 NOTE — Telephone Encounter (Signed)
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 She needs to monitor blood pressure and be seen if having any hypotension

## 2013-04-04 NOTE — Telephone Encounter (Signed)
Patient is calling to see if Dr. Alwyn Ren will write her a new prescription for Procardia 30mg  XL extended release. She says her friend recommended this to her for her Raynauds diagnosis. She says that she has  been experiencing her hands turning white several times a day at work and wants to see if this will help. Please advise.

## 2013-05-23 ENCOUNTER — Other Ambulatory Visit: Payer: Self-pay

## 2013-06-27 ENCOUNTER — Other Ambulatory Visit: Payer: Self-pay | Admitting: Neurology

## 2013-06-28 ENCOUNTER — Other Ambulatory Visit: Payer: Self-pay

## 2013-06-28 MED ORDER — ZALEPLON 10 MG PO CAPS: 10.0000 mg | ORAL_CAPSULE | ORAL | Status: DC | PRN

## 2013-06-28 NOTE — Telephone Encounter (Signed)
Rx faxed over to [facility] outpatient at [phone].

## 2013-07-22 ENCOUNTER — Other Ambulatory Visit: Payer: Self-pay | Admitting: Neurology

## 2013-08-05 ENCOUNTER — Telehealth: Payer: Self-pay | Admitting: Neurology

## 2013-08-05 NOTE — Telephone Encounter (Signed)
Patient called stating that when she got her Sonata refill she was told that she needs to schedule a follow up visit with Dr. Brett Fairy before she can authorize any more refills. Please call the patient to schedule. Patient did not want to wait until first available to schedule.

## 2013-08-06 NOTE — Telephone Encounter (Signed)
Patient scheduled/confirmed

## 2013-08-08 ENCOUNTER — Other Ambulatory Visit: Payer: Self-pay | Admitting: Obstetrics and Gynecology

## 2013-08-08 DIAGNOSIS — R921 Mammographic calcification found on diagnostic imaging of breast: Secondary | ICD-10-CM

## 2013-08-16 ENCOUNTER — Other Ambulatory Visit: Payer: Self-pay | Admitting: *Deleted

## 2013-08-16 ENCOUNTER — Telehealth: Payer: Self-pay | Admitting: *Deleted

## 2013-08-16 MED ORDER — AMOXICILLIN 500 MG PO CAPS: ORAL_CAPSULE | ORAL | Status: DC

## 2013-08-16 NOTE — Telephone Encounter (Signed)
Done. JG//CMA 

## 2013-08-16 NOTE — Telephone Encounter (Signed)
Patient called and stated that she has two separate appoitments with a periodontist (one on 08/21/13 and one on 08/27/13). She would like to know if an antibiotic can be prescribed for her. Please advise. JG//CMA

## 2013-08-16 NOTE — Telephone Encounter (Signed)
ZAmox 500 # 30  4 pills 1 hr pre op prn

## 2013-08-28 ENCOUNTER — Ambulatory Visit (INDEPENDENT_AMBULATORY_CARE_PROVIDER_SITE_OTHER): Payer: 59 | Admitting: Neurology

## 2013-08-28 ENCOUNTER — Encounter: Payer: Self-pay | Admitting: Neurology

## 2013-08-28 VITALS — BP 119/75 | HR 63 | Resp 17 | Ht 66.5 in | Wt 137.0 lb

## 2013-08-28 DIAGNOSIS — M2610 Unspecified anomaly of jaw-cranial base relationship: Secondary | ICD-10-CM

## 2013-08-28 DIAGNOSIS — R0609 Other forms of dyspnea: Secondary | ICD-10-CM

## 2013-08-28 DIAGNOSIS — R0683 Snoring: Secondary | ICD-10-CM

## 2013-08-28 DIAGNOSIS — G4730 Sleep apnea, unspecified: Secondary | ICD-10-CM

## 2013-08-28 DIAGNOSIS — M2619 Other specified anomalies of jaw-cranial base relationship: Secondary | ICD-10-CM

## 2013-08-28 DIAGNOSIS — R0989 Other specified symptoms and signs involving the circulatory and respiratory systems: Secondary | ICD-10-CM

## 2013-08-28 DIAGNOSIS — G4700 Insomnia, unspecified: Secondary | ICD-10-CM | POA: Insufficient documentation

## 2013-08-28 HISTORY — DX: Insomnia, unspecified: G47.00

## 2013-08-28 NOTE — Progress Notes (Signed)
[facility]  Provider:  Larey Seat, M D  Referring Provider: Hendricks Limes, MD Primary Care Physician:  Unice Cobble, MD  Katherine [name], a 48 year old caucasian female patient of Dr. Unice Cobble , presents today for insomnia and snoring.    HPI:  Katherine [name] is a 48 y.o. female , caucasian and right handed,  who reports worsening sleep problems with the onset of  Peri- menopause. She stated that she had frequent hot flashes often interrupting her sleep, waking her drenched in sweat and she was therefore using a ceiling fan in the marital bedroom. Her husband has not always been in agreement with her desire for a low  temperature . The bedroom as described as cool, dark and quiet. The patient's husband has reported that she is" breathing hard" at times with moderate snoring- but he was unsure if he ever witnessed apneas.  The patient has a history of bruxism and is aware that she grinds her teeth at night, something that is an ongoing problem.  She also has pain in her hips, left more than right and it has become difficult for her to find a comfortable position to sleep in.  In the morning she often finds the covers of her bed on the floor. The sheets and his array from frequently tossing and turning. Her husband had told her that she's talking in her sleep, also he is not here to confirm this.  She reports that she has a lot of regular bedtime around 10 PM and rises in the morning of on 6 her sleep duration however is less than the 8 hours, her sleep is interrupted by frequent movement and by at least one nocturia break at night. She seems to regularly wake up between 30 a.m. and 3:30 AM. She avoids any naps in the afternoon and she drinks caffeine only in the morning as not to worsen her insomnia complained. When she wakes up she does so spontaneously ,does not  need an alarm.  She feels that her sleep is not restful, nor  restorative. She has tried [employer] as a short  acting sleep aid, but wakes between 2:30 and 3 AM still. 2 years ago she had tried to use Ambien, which still on the a lot of her to sleep about an hour longer than she does not resolve after. At that time she also reported that she had very vivid crazy dreams on the sleep aid medication and that she felt uncoordinated and 'hung over" in the mornings. However she falls asleep more promptly using this medication and she can use it even for a second half of the night. Previously, she took Advil p.m. which helped with the pain a little bit but gave her a very dry mouth in the morning. The patient's work weakness between Monday and Friday regular office hours in daytime, and she has been taking over-the-counter sleep aids for the most part. Her fatigue which she describes as rather elevated increases as a week course on. On the weekends she feels that she wants to make up for lost sleep.   The patient reports a childhood history of sleep walking and sleep talking. She has a slight underbite, which likely alter his upper airway. She reports that she likes to sleep on the side rather than in supine position ,but that her hip pain has forced her to resume the supine and actually prop up her knees. Bursitis on the right hip was diagnosed.  She's a nonsmoker, not a shift worker and she does consume alcohol rarely. 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 She still works in the [employer]. Normally she works 12 hour shifts. She is unaware of a family history of sleep disorders.   Review of Systems: Out of a complete 14 system review, the patient complains of only the following symptoms, and all other reviewed systems are negative.  The patient endorsed redness of the eyes, light sensitivity and dry eyes facial flushing, heart murmur, tinnitus, insomnia, restless sleep, frequently waking up, daytime sleepiness, snoring, sleep talking-acting out  dreams, frequency of urination, joint pain back pain and joint swelling as well as some headaches. The Epworth sleepiness score was and was at her points, the fatigue severity score at 44 points. A depression score was not obtained.  The patient is followed for right hip bursitis by Dr. Ouida Sills rheumatologist.   History   Social History  . Marital Status: Married    Spouse Name: [name]    Number of Children: 1  . Years of Education: BSN   Occupational History  . Palliative Care Nurse  [employer]   Social History Main Topics  . Smoking status: Former Research scientist (life sciences)  . Smokeless tobacco: Not on file     Comment: about age 53, 50  . Alcohol Use: Yes     Comment: occasionally   . Drug Use: No  . Sexual Activity: Not on file   Other Topics Concern  . Not on file   Social History Narrative   [name]).   Patient has one child.   Patient is employed at [employer].   Patient has a Copywriter, advertising.   Patient drinks two cups of coffee every morning.   Regular exercise- Tae Kwon Do             Family History  Problem Relation Age of Onset  . Adopted: Yes  . Heart attack Maternal Uncle 37  . Cervical cancer Maternal Grandmother   . Stroke Mother   . Diabetes Brother   . Depression Brother     1/2 brother  . Hypertension    . Heart Problems      arrhythmia    Past Medical History  Diagnosis Date  . Raynaud's syndrome   . Pure hypercholesterolemia   . Heart murmur   . Plantar fasciitis   . Insomnia   . Insomnia, persistent 08/28/2013    Menopausal induced insomnia, early morning awakenings, and snoring.   . 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    Past Surgical History  Procedure Laterality Date  . Cesarean section  1999  . Tonsillectomy and adenoidectomy  1981  . Uterine fibroidectomy      @ c section  . Plantar fasciitis surgery  2011, 2007    Current Outpatient Prescriptions  Medication Sig Dispense Refill  . AMBULATORY NON FORMULARY MEDICATION  Medication Name: Zyflamend whole body (anit-inflammatory): 2 by mouth every other day      . amoxicillin (AMOXIL) 500 MG capsule Take 4 pills 1 hour pre-op prn  30 capsule  0  . B Complex-Folic Acid (B COMPLEX PLUS PO) Take by mouth daily.      Marland Kitchen estradiol-norethindrone (ACTIVELLA) 1-0.5 MG per tablet Take 1 tablet by mouth daily.      . Estradiol-Norethindrone Acet (ACTIVELLA) 0.5-0.1 MG per tablet Take 1 tablet by mouth daily.      Marland Kitchen  Multiple Vitamins-Minerals (CENTRUM SILVER PO) Take by mouth daily.      Marland Kitchen NIFEdipine (PROCARDIA XL) 30 MG 24 hr tablet Take 1 tablet (30 mg total) by mouth daily.  30 tablet  5  . nitrofurantoin, macrocrystal-monohydrate, (MACROBID) 100 MG capsule Take 1 capsule (100 mg total) by mouth 2 (two) times daily.  14 capsule  0  . ondansetron (ZOFRAN) 4 MG tablet Take 1 tab as needed every hours for nausea.  8 tablet  0  . phenazopyridine (PYRIDIUM) 200 MG tablet Take 1 tablet (200 mg total) by mouth 3 (three) times daily as needed for pain.  10 tablet  0  . zaleplon (SONATA) 10 MG capsule TAKE 1 CAPSULE BY MOUTH ONCE DAILY IN THE EVENING  30 capsule  0   No current facility-administered medications for this visit.    Allergies as of 08/28/2013 - Review Complete 08/28/2013  Allergen Reaction Noted  . Ultram [tramadol]  08/28/2013  . Hydrocodone-acetaminophen    . Oxycodone-acetaminophen      Vitals: BP 119/75  Pulse 63  Resp 17  Ht 5' 6.5" (1.689 m)  Wt 137 lb (62.143 kg)  BMI 21.78 kg/m2 Last Weight:  Wt Readings from Last 1 Encounters:  08/28/13 137 lb (62.143 kg)   Last Height:   Ht Readings from Last 1 Encounters:  08/28/13 5' 6.5" (1.689 m)    Physical exam:  General: The patient is awake, alert and appears not in acute distress. The patient is well groomed. Head: Normocephalic, atraumatic. Neck is supple. Mallampati 3, neck circumference: 15 ,  Under bite . Cardiovascular:  Regular rate and rhythm, without  murmurs or carotid bruit, and  without distended neck veins. Respiratory: Lungs are clear to auscultation. Skin:  Without evidence of edema, or rash Trunk: BMI is normal, the patient is slender -has normal posture.  Neurologic exam : The patient is awake and alert, oriented to place and time.  Memory subjective  described as intact. There is a normal attention span & concentration ability.  Speech is fluent without  dysarthria, dysphonia or aphasia. Mood and affect are appropriate.  Cranial nerves: Pupils are equal and briskly reactive to light. Funduscopic exam without  evidence of pallor or edema. The patients report of dry eyes is reflected in some eye redness . Extraocular movements  in vertical and horizontal planes intact and without nystagmus. Visual fields by finger perimetry are intact. Hearing to finger rub intact.  Facial sensation intact to fine touch. Facial motor strength is symmetric and tongue and uvula move midline. Opening and closing of the jaw did not produce a palpable click.  Motor exam:  Normal tone and normal muscle bulk and symmetricstrength in all extremities. The right hip does produce a click with passive range of motion. However, there is no loss of strength.  Sensory:  Fine touch, pinprick and vibration were tested in all extremities. Proprioception is normal.  Coordination: Rapid alternating movements in the fingers/hands is tested and normal. Finger-to-nose maneuver tested and normal without evidence of ataxia, dysmetria or tremor.  Gait and station: Patient walks without assistive device . Deep tendon reflexes: in the  upper and lower extremities are symmetric and intact. Babinski maneuver response is down going.   Assessment:  After physical and neurologic examination, review of laboratory studies, imaging, neurophysiology testing and pre-existing records, assessment is  Patient with chronic insomnia problems to initiate and maintain sleep. This has not been reflected in excess of  daytime sleepiness is much as fatigue  a feeling of being constantly drained. I will order a sleep study for this patient based on her husband report of snoring, or inability to sleep at least on the right side to have pain and does being in supine position. She also wakes regularly with a dry mouth this indicates that she does not breath through her nose.   Plan:  Treatment plan and additional workup : Entered order for a split-night sleep study, depending on the results I may continue prescribing a sleep aid for the patient, probably in conjunction with Melatonin to be taken one to 2 hours prior to the intended sleep time. I would offer her a Sonata at 5 mg orally to be taken from Monday through Friday. As to her high degree of fatigue; this may not just reflect a sleep disorder but an involvement of her underlying rheumatological problems.  The patient will come in for a sleep study, a depression score will also be obtained. Treatment with an antidepressant may help to eliminate the early morning arousals. I will meet with the patient  within 4 weeks after her sleep study and  Revisit.  Elimelech Houseman, MD

## 2013-08-28 NOTE — Patient Instructions (Signed)

## 2013-08-30 ENCOUNTER — Ambulatory Visit (INDEPENDENT_AMBULATORY_CARE_PROVIDER_SITE_OTHER): Payer: 59 | Admitting: *Deleted

## 2013-08-30 ENCOUNTER — Encounter: Payer: Self-pay | Admitting: *Deleted

## 2013-08-30 VITALS — HR 54

## 2013-08-30 DIAGNOSIS — R0683 Snoring: Secondary | ICD-10-CM

## 2013-08-30 DIAGNOSIS — G4700 Insomnia, unspecified: Secondary | ICD-10-CM

## 2013-08-30 DIAGNOSIS — M2619 Other specified anomalies of jaw-cranial base relationship: Secondary | ICD-10-CM

## 2013-08-30 DIAGNOSIS — G4733 Obstructive sleep apnea (adult) (pediatric): Secondary | ICD-10-CM

## 2013-08-30 NOTE — Sleep Study (Signed)
Patient arrives for HST instruction.  Patient is given written instructions, picture instructions, and a demonstration on how to use HST unit.  All questions / concerns were addressed by technologist.  Financial responsibility was explained.  Follow up information was given to patient regarding how results would be received.  

## 2013-09-05 ENCOUNTER — Encounter: Payer: Self-pay | Admitting: Neurology

## 2013-09-05 DIAGNOSIS — G4730 Sleep apnea, unspecified: Secondary | ICD-10-CM

## 2013-09-05 DIAGNOSIS — G4700 Insomnia, unspecified: Secondary | ICD-10-CM

## 2013-09-05 HISTORY — DX: Insomnia, unspecified: G47.30

## 2013-09-05 HISTORY — DX: Insomnia, unspecified: G47.00

## 2013-09-06 ENCOUNTER — Other Ambulatory Visit: Payer: Self-pay | Admitting: Obstetrics and Gynecology

## 2013-09-06 ENCOUNTER — Ambulatory Visit
Admission: RE | Admit: 2013-09-06 | Discharge: 2013-09-06 | Disposition: A | Discharge status: Home / Self Care | Payer: 59 | Source: Ambulatory Visit | Attending: Obstetrics and Gynecology | Admitting: Obstetrics and Gynecology

## 2013-09-06 ENCOUNTER — Telehealth: Payer: Self-pay | Admitting: Neurology

## 2013-09-06 DIAGNOSIS — R921 Mammographic calcification found on diagnostic imaging of breast: Secondary | ICD-10-CM

## 2013-09-06 NOTE — Telephone Encounter (Signed)
I called back. Spoke with pharmacist.  The patient brought a handwritten Rx from 02/11 to them.  It was noted that the dose was different from her last fill, and as well she has always gotten the generic form of this medication, and they wanted to verify this was still the case.  By viewing the OV note, it says the patient is now taking 5mg  caps, it is not indicated that she should be taking Brand Name.  Since she has always gotten generic, this is how it will be filled.

## 2013-09-06 NOTE — Telephone Encounter (Signed)
Katherine [name] from [facility] calling with a question for patient's [employer]. Please call her back and advise.

## 2013-09-16 ENCOUNTER — Telehealth: Payer: Self-pay | Admitting: Neurology

## 2013-09-16 ENCOUNTER — Encounter: Payer: Self-pay | Admitting: *Deleted

## 2013-09-16 NOTE — Telephone Encounter (Signed)
I called and spoke with the patient about her recent HST results. I  Informed the patient that the study revealed mild apnea due to the fact that she had notable long periods of shallow breathing episodes that were accompanied by 4% oxygen saturations and snoring. Informed the patient that Dr. Brett Fairy recommends oral appliance device therapy, but she would like for the patient to come into the office to discuss this option. Patient has been scheduled for March 30,2015 at 1:30 pm with an arrival time of 1:15 pm. I will fax a copy of the report to Dr. Jaclyn Prime office and mail a copy to the patient.

## 2013-09-23 ENCOUNTER — Ambulatory Visit
Admission: RE | Admit: 2013-09-23 | Discharge: 2013-09-23 | Disposition: A | Discharge status: Home / Self Care | Payer: 59 | Source: Ambulatory Visit | Attending: Obstetrics and Gynecology | Admitting: Obstetrics and Gynecology

## 2013-09-23 DIAGNOSIS — R921 Mammographic calcification found on diagnostic imaging of breast: Secondary | ICD-10-CM

## 2013-10-07 ENCOUNTER — Encounter (INDEPENDENT_AMBULATORY_CARE_PROVIDER_SITE_OTHER): Payer: Self-pay | Admitting: General Surgery

## 2013-10-07 ENCOUNTER — Ambulatory Visit (INDEPENDENT_AMBULATORY_CARE_PROVIDER_SITE_OTHER): Payer: Commercial Managed Care - PPO | Admitting: General Surgery

## 2013-10-07 VITALS — BP 122/80 | HR 77 | Temp 97.8°F | Ht 66.0 in | Wt 135.0 lb

## 2013-10-07 DIAGNOSIS — N6099 Unspecified benign mammary dysplasia of unspecified breast: Secondary | ICD-10-CM

## 2013-10-07 DIAGNOSIS — N62 Hypertrophy of breast: Secondary | ICD-10-CM

## 2013-10-07 NOTE — Progress Notes (Signed)
Patient ID: Katherine [name], female   DOB: 1965/11/11, 48 y.o.   MRN: [id number]  Chief Complaint  Patient presents with  . lt alh    new pt    HPI Katherine [name] is a 48 y.o. female.  Referred by Dr Everlean Alstrom HPI This is a 48 year old female who is otherwise healthy who presents after undergoing a screening tomo mammogram shows breast density category C. There is a stable group of punctate calcifications in the right breast measuring 2 mm and it had been stable for a year now. There is also a group of punctate calcifications in the upper outer left breast spanning 6 mm. These appear slightly increased. She has no complaints referable to either breasts. She was offered a 6 month followup of these probably benign left breast calcifications she requested a stereotactic biopsy. Stereotactic biopsy of the left breast lesions were then performed with the findings being atypical lobular hyperplasia. She was then referred for evaluation. She is adopted and does not know all of her family history. She has been maintained on estrogen for 1-1/2 years now. Past Medical History  Diagnosis Date  . Raynaud's syndrome   . Pure hypercholesterolemia   . Heart murmur   . Plantar fasciitis   . Insomnia   . Insomnia, persistent 08/28/2013    Menopausal induced insomnia, early morning awakenings, and snoring.   . 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    Past Surgical History  Procedure Laterality Date  . Cesarean section  1999  . Tonsillectomy and adenoidectomy  1981  . Uterine fibroidectomy      @ c section  . Plantar fasciitis surgery  2011, 2007    Family History  Problem Relation Age of Onset  . Adopted: Yes  . Heart attack Maternal Uncle 37  . Cervical cancer Maternal Grandmother   . Stroke Mother   . Diabetes Brother   . Depression Brother     1/2 brother  . Hypertension    . Heart Problems      arrhythmia    Social History History  Substance Use Topics  . Smoking status: Former Research scientist (life sciences)  .  Smokeless tobacco: Not on file     Comment: about age 2, 2  . Alcohol Use: Yes     Comment: occasionally     Allergies  Allergen Reactions  . Ultram [Tramadol]   . Hydrocodone-Acetaminophen     REACTION: NAUSEA  . Oxycodone-Acetaminophen     REACTION: NAUSEA    Current Outpatient Prescriptions  Medication Sig Dispense Refill  . AMBULATORY NON FORMULARY MEDICATION Medication Name: Zyflamend whole body (anit-inflammatory): 2 by mouth every other day      . B Complex-Folic Acid (B COMPLEX PLUS PO) Take by mouth daily.      Marland Kitchen estradiol-norethindrone (ACTIVELLA) 1-0.5 MG per tablet Take 1 tablet by mouth daily.      . Estradiol-Norethindrone Acet (ACTIVELLA) 0.5-0.1 MG per tablet Take 1 tablet by mouth daily.      . Multiple Vitamins-Minerals (CENTRUM SILVER PO) Take by mouth daily.      Marland Kitchen NIFEdipine (PROCARDIA XL) 30 MG 24 hr tablet Take 1 tablet (30 mg total) by mouth daily.  30 tablet  5  . nitrofurantoin, macrocrystal-monohydrate, (MACROBID) 100 MG capsule Take 1 capsule (100 mg total) by mouth 2 (two) times daily.  14 capsule  0  . zaleplon (SONATA) 10 MG capsule TAKE 1 CAPSULE BY MOUTH ONCE DAILY IN THE EVENING  30 capsule  0  No current facility-administered medications for this visit.    Review of Systems Review of Systems  Constitutional: Negative for fever, chills and unexpected weight change.  HENT: Negative for congestion, hearing loss, sore throat, trouble swallowing and voice change.   Eyes: Negative for visual disturbance.  Respiratory: Negative for cough and wheezing.   Cardiovascular: Positive for palpitations. Negative for chest pain and leg swelling.  Gastrointestinal: Negative for nausea, vomiting, abdominal pain, diarrhea, constipation, blood in stool, abdominal distention and anal bleeding.  Genitourinary: Negative for hematuria, vaginal bleeding and difficulty urinating.  Musculoskeletal: Negative for arthralgias.  Skin: Negative for rash and wound.    Neurological: Negative for seizures, syncope and headaches.  Hematological: Negative for adenopathy. Does not bruise/bleed easily.  Psychiatric/Behavioral: Negative for confusion.    Blood pressure 122/80, pulse 77, temperature 97.8 F (36.6 C), temperature source Oral, height 5\' 6"  (1.676 m), weight 135 lb (61.236 kg).  Physical Exam Physical Exam  Vitals reviewed. Constitutional: She appears well-developed and well-nourished.  Neck: Neck supple.  Cardiovascular: Normal rate, regular rhythm and normal heart sounds.   Pulmonary/Chest: Effort normal and breath sounds normal. She has no wheezes. She has no rales. Right breast exhibits no inverted nipple, no mass, no nipple discharge, no skin change and no tenderness. Left breast exhibits no inverted nipple, no mass, no nipple discharge, no skin change and no tenderness.    Lymphadenopathy:    She has no cervical adenopathy.    Data Reviewed XAM:  DIGITAL DIAGNOSTIC BILATERAL MAMMOGRAM WITH CAD  COMPARISON: Previous exams.  ACR Breast Density Category c: The breast tissue is heterogeneously  dense, which may obscure small masses.  FINDINGS:  There is a stable group of round and punctate calcifications in the  superior right breast spanning a distance of 2 mm and confirmed on  the additional spot compression magnification CC and mL views.  There is a group of punctate calcifications in the upper-outer left  breast spanning a distance of 6 mm confirmed on the spot compression  magnification CC and mL views. These appear slightly increased in  number when compared to the prior exam, however this is felt to be a  result of motion obscuring the calcifications on the prior exam  rather than a true increase in number of calcifications.  Mammographic images were processed with CAD.  IMPRESSION:  1. Stable probably benign right breast calcifications.  2. Probably benign left breast calcifications, which appear slightly  increased in  number when compared to the prior exam, however this is  felt to be a result of motion obscuring the calcifications on the  prior exam rather than a true increase in number of the  calcifications.  RECOMMENDATION:  1. Six-month followup of the probably benign right breast  calcifications which will demonstrate a total of 1 year of  stability.  2. An additional six-month follow-up of the probably benign left  breast calcifications was offered to the patient, however she  requests stereotactic biopsy. This is scheduled for February 26 at 8  a.m.   Assessment    Left breast atypical lobular hyperplasia on core biopsy     Plan    Left breast radioactive seed guided excisional biopsy  We discussed her pathology rolled resulting atypical lobular hyperplasia. She is other areas on the right side are abnormal but stable. I discussed the indications for excisional biopsy being to rule out cancer at that site. We discussed the performance of this with a radioactive seed as well as  the postoperative restrictions. We discussed the risks associated with it also. She asked about what this would mean in terms of her moving forward for risk. I discussed at a minimum I would send her to go see one of the oncologists for risk reduction evaluation. If this is a been something more we will certainly have a discussion about some further evaluation as well as further surgery. She actually asked about bilateral mastectomy or unilateral mastectomy in the face of atypical lobular hyperplasia. I think that that would not be the best course of action at this least at this point I told her that there was not any evidence of there will be any survival advantage to her. I also told her that mastectomy is not longer present prevented developing breast cancer. We're to proceed with this excision and then go from there.    we will wait a week or two due to significant hematoma.    Najat Olazabal 10/07/2013, 5:18  PM

## 2013-10-10 ENCOUNTER — Other Ambulatory Visit (INDEPENDENT_AMBULATORY_CARE_PROVIDER_SITE_OTHER): Payer: Self-pay | Admitting: General Surgery

## 2013-10-10 DIAGNOSIS — N6099 Unspecified benign mammary dysplasia of unspecified breast: Secondary | ICD-10-CM

## 2013-10-11 ENCOUNTER — Telehealth (INDEPENDENT_AMBULATORY_CARE_PROVIDER_SITE_OTHER): Payer: Self-pay | Admitting: General Surgery

## 2013-10-11 NOTE — Telephone Encounter (Signed)
Pt called to ask a question about going for marker before her surgery.  She thinks she may have been marked on/ about 09/06/13 at the [facility].  If so, so she need to go again.  Just asking.  Please call her cell.

## 2013-10-14 ENCOUNTER — Encounter: Payer: Self-pay | Admitting: Neurology

## 2013-10-14 ENCOUNTER — Ambulatory Visit (INDEPENDENT_AMBULATORY_CARE_PROVIDER_SITE_OTHER): Payer: 59 | Admitting: Neurology

## 2013-10-14 ENCOUNTER — Encounter (INDEPENDENT_AMBULATORY_CARE_PROVIDER_SITE_OTHER): Payer: Self-pay

## 2013-10-14 VITALS — BP 124/74 | HR 50 | Resp 16 | Ht 66.0 in | Wt 138.0 lb

## 2013-10-14 DIAGNOSIS — F458 Other somatoform disorders: Secondary | ICD-10-CM

## 2013-10-14 DIAGNOSIS — G478 Other sleep disorders: Secondary | ICD-10-CM

## 2013-10-14 DIAGNOSIS — G4730 Sleep apnea, unspecified: Principal | ICD-10-CM

## 2013-10-14 DIAGNOSIS — G4700 Insomnia, unspecified: Secondary | ICD-10-CM

## 2013-10-14 MED ORDER — ZALEPLON 10 MG PO CAPS: 10.0000 mg | ORAL_CAPSULE | Freq: Every evening | ORAL | Status: DC | PRN

## 2013-10-14 NOTE — Progress Notes (Signed)
[facility]  Provider:  Larey Seat, M D  Referring Provider: Hendricks Limes, MD Primary Care Physician:  Unice Cobble, MD  [name], a 48 year old caucasian female patient of Dr. Unice Cobble , presents today for insomnia and snoring.    HPI:  Patient presents for follow up after a recent HST on 2-14/15- 2015 ;  Her HST documented no major desaturations but an RDI of 13 and AHI of 8/hr.   She reports she cannot sleep on 5 mg Sonata , needs 10 mg.     Last visit / consult note :   Katherine [name] is a 48 y.o. female,  caucasian and right handed,  who reports worsening sleep problems with the onset of  Peri- menopause. She stated that she had frequent hot flashes often interrupting her sleep, waking her drenched in sweat and she was therefore using a ceiling fan in the marital bedroom. Her husband has not always been in agreement with her desire for a low  temperature . The bedroom as described as cool, dark and quiet. The patient's husband has reported that she is" breathing hard" at times with moderate snoring- but he was unsure if he ever witnessed apneas. The patient has a history of bruxism and is aware that she grinds her teeth at night, something that is an ongoing problem. She also has pain in her hips, left more than right and it has become difficult for her to find a comfortable position to sleep in.  In the morning she often finds the covers of her bed on the floor. The sheets and his array from frequently tossing and turning. Her husband had told her that she's talking in her sleep, also he is not here to confirm this.  She reports that she has a lot of regular bedtime around 10 PM and rises in the morning of on 6 her sleep duration however is less than the 8 hours, her sleep is interrupted by frequent movement and by at least one nocturia break at night. She seems to regularly wake up between 30 a.m. and 3:30 AM. She avoids any naps in the afternoon and she  drinks caffeine only in the morning as not to worsen her insomnia complained. When she wakes up she does so spontaneously ,does not  need an alarm.  She feels that her sleep is not restful, nor  restorative. She has tried [employer] as a short acting sleep aid, but wakes between 2:30 and 3 AM still. 2 years ago she had tried to use Ambien, which still on the a lot of her to sleep about an hour longer than she does not resolve after. At that time she also reported that she had very vivid crazy dreams on the sleep aid medication and that she felt uncoordinated and 'hung over" in the mornings. However she falls asleep more promptly using this medication and she can use it even for a second half of the night. Previously, she took Advil p.m. which helped with the pain a little bit but gave her a very dry mouth in the morning. The patient's work weakness between Monday and Friday regular office hours in daytime, and she has been taking over-the-counter sleep aids for the most part. Her fatigue which she describes as rather elevated increases as a week course on. On the weekends she feels that she wants to make up for lost sleep.   The patient reports a childhood history of sleep walking and  sleep talking. She has a slight underbite, which likely alter his upper airway. She reports that she likes to sleep on the side rather than in supine position ,but that her hip pain has forced her to resume the supine and actually prop up her knees. Bursitis on the right hip was diagnosed.   She's a nonsmoker, not a shift worker and she does consume alcohol rarely. 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 She still works in the [employer]. Normally she works 12 hour shifts. She is unaware of a family history of sleep disorders.   Review of Systems: Out of a complete 14 system review, the patient complains of only the following symptoms, and all other reviewed  systems are negative.  The patient endorsed redness of the eyes, light sensitivity and dry eyes facial flushing, heart murmur, tinnitus, insomnia, restless sleep, frequently waking up, daytime sleepiness, snoring, sleep talking-acting out dreams, frequency of urination, joint pain back pain and joint swelling as well as some headaches. The Epworth sleepiness score was and was at her points, the fatigue severity score at 44 points. A depression score was not obtained.  The patient is followed for right hip bursitis by Dr. Ouida Sills rheumatologist.   History   Social History  . Marital Status: Married    Spouse Name: [name]    Number of Children: 1  . Years of Education: BSN   Occupational History  . Palliative Care Nurse  [employer]   Social History Main Topics  . Smoking status: Former Research scientist (life sciences)  . Smokeless tobacco: Never Used     Comment: about age 47, 75  . Alcohol Use: Yes     Comment: occasionally   . Drug Use: No  . Sexual Activity: Not on file   Other Topics Concern  . Not on file   Social History Narrative   [name]).   Patient has one child.   Patient is employed at [employer].   Patient has a Copywriter, advertising.   Patient drinks two cups of coffee every morning.   Regular exercise- Tae Kwon Do             Family History  Problem Relation Age of Onset  . Adopted: Yes  . Heart attack Maternal Uncle 37  . Cervical cancer Maternal Grandmother   . Stroke Mother   . Diabetes Brother   . Depression Brother     1/2 brother  . Hypertension    . Heart Problems      arrhythmia    Past Medical History  Diagnosis Date  . Raynaud's syndrome   . Pure hypercholesterolemia   . Heart murmur   . Plantar fasciitis   . Insomnia   . Insomnia, persistent 08/28/2013    Menopausal induced insomnia, early morning awakenings, and snoring.   . 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    Past Surgical History  Procedure Laterality Date  . Cesarean section  1999   . Tonsillectomy and adenoidectomy  1981  . Uterine fibroidectomy      @ c section  . Plantar fasciitis surgery  2011, 2007    Current Outpatient Prescriptions  Medication Sig Dispense Refill  . AMBULATORY NON FORMULARY MEDICATION Medication Name: Zyflamend whole body (anit-inflammatory): 2 by mouth every other day      . B Complex-Folic Acid (B COMPLEX PLUS PO) Take by mouth daily.      . Multiple  Vitamins-Minerals (CENTRUM SILVER PO) Take by mouth daily.      Marland Kitchen NIFEdipine (PROCARDIA XL) 30 MG 24 hr tablet Take 1 tablet (30 mg total) by mouth daily.  30 tablet  5  . zaleplon (SONATA) 10 MG capsule Take 1 capsule (10 mg total) by mouth at bedtime as needed for sleep.  30 capsule  3   No current facility-administered medications for this visit.    Allergies as of 10/14/2013 - Review Complete 10/14/2013  Allergen Reaction Noted  . Ultram [tramadol]  08/28/2013  . Hydrocodone-acetaminophen    . Oxycodone-acetaminophen      Vitals: BP 124/74  Pulse 50  Resp 16  Ht 5\' 6"  (1.676 m)  Wt 138 lb (62.596 kg)  BMI 22.28 kg/m2 Last Weight:  Wt Readings from Last 1 Encounters:  10/14/13 138 lb (62.596 kg)   Last Height:   Ht Readings from Last 1 Encounters:  10/14/13 5\' 6"  (1.676 m)    Physical exam:  General: The patient is awake, alert and appears not in acute distress. The patient is well groomed. Head: Normocephalic, atraumatic. Neck is supple. Mallampati 3, neck circumference: 15 ,  Under bite . Cardiovascular:  Regular rate and rhythm, without  murmurs or carotid bruit, and without distended neck veins. Respiratory: Lungs are clear to auscultation. Skin:  Without evidence of edema, or rash Trunk: BMI is normal, the patient is slender -has normal posture.  Neurologic exam : The patient is awake and alert, oriented to place and time.  Memory subjective  described as intact. There is a normal attention span & concentration ability.  Speech is fluent without  dysarthria,  dysphonia or aphasia. Mood and affect are appropriate.  Cranial nerves: Pupils are equal and briskly reactive to light. Funduscopic exam without  evidence of pallor or edema. The patients report of dry eyes is reflected in some eye redness . Extraocular movements  in vertical and horizontal planes intact and without nystagmus. Visual fields by finger perimetry are intact. Hearing to finger rub intact.  Facial sensation intact to fine touch. Facial motor strength is symmetric and tongue and uvula move midline. Opening and closing of the jaw did not produce a palpable click.  Motor exam:  Normal tone and normal muscle bulk and symmetricstrength in all extremities. The right hip does produce a click with passive range of motion. However, there is no loss of strength.  Sensory:  Fine touch, pinprick and vibration were tested in all extremities. Proprioception is normal.  Coordination: Rapid alternating movements in the fingers/hands is tested and normal. Finger-to-nose maneuver tested and normal without evidence of ataxia, dysmetria or tremor.  Gait and station: Patient walks without assistive device . Deep tendon reflexes: in the  upper and lower extremities are symmetric and intact. Babinski maneuver response is down going.   Assessment:  After physical and neurologic examination, review of laboratory studies, imaging, neurophysiology testing and pre-existing records, assessment is  Based on the results of her home sleep test from 08-31-13 the patient has clearly upper airway resistance the central to a mild degree the RDI was 13 an hour, AHI was 8 per hour. I have suggested to treat this degree of apnea and snoring with a dental device and not with CPAP and she had no prolonged oxygen desaturations the patient only spent a total of 4 minutes at night with a saturation lower than 89%. She remains highly fatigued FFS was 35 but not necessary daytime sleepy, as her Epworth score is 3  points  today.  Patient with chronic insomnia problems to initiate and maintain sleep. This has not been reflected in excess of daytime sleepiness is much as fatigue a feeling of being constantly drained. She has several stressors, had a positive breast biopsy and her 72 year old son developed a swelling on his neck, possible lymphatic. She requested Sonata at 10 mg to treat her insomnia.   Plan:  Treatment plan and additional workup :  Referral for a dental device that could address Rolena Infante is an, snoring and upper airway resistance he and in the process usually reduces  the A H. Murriel Hopper, MD  10-14-13

## 2013-10-14 NOTE — Patient Instructions (Signed)
upper airway resistancy.

## 2013-10-14 NOTE — Telephone Encounter (Signed)
Returned call. I explained to the pt that there was a clip placement done by [facility] during the times she got her images but it's different from the seed placement on the day of sx. I explained that the seed placement will be needed for Dr Donne Hazel at the time of surgery to know exactly the area he is going to be removing. The pt understands.

## 2013-10-15 ENCOUNTER — Encounter (HOSPITAL_BASED_OUTPATIENT_CLINIC_OR_DEPARTMENT_OTHER): Payer: Self-pay | Admitting: *Deleted

## 2013-10-15 NOTE — Progress Notes (Signed)
Pt works [employer]-will come in for [facility] labs when she goes for seeds 10/16/13 Had a sleep study recently and will use a dental device but does not have it yet

## 2013-10-16 ENCOUNTER — Ambulatory Visit
Admission: RE | Admit: 2013-10-16 | Discharge: 2013-10-16 | Disposition: A | Discharge status: Home / Self Care | Payer: 59 | Source: Ambulatory Visit | Attending: General Surgery | Admitting: General Surgery

## 2013-10-16 ENCOUNTER — Encounter (HOSPITAL_BASED_OUTPATIENT_CLINIC_OR_DEPARTMENT_OTHER)
Admission: RE | Admit: 2013-10-16 | Discharge: 2013-10-16 | Disposition: A | Discharge status: Home / Self Care | Payer: 59 | Source: Ambulatory Visit | Attending: General Surgery | Admitting: General Surgery

## 2013-10-16 DIAGNOSIS — N6099 Unspecified benign mammary dysplasia of unspecified breast: Secondary | ICD-10-CM

## 2013-10-16 DIAGNOSIS — Z01812 Encounter for preprocedural laboratory examination: Secondary | ICD-10-CM | POA: Insufficient documentation

## 2013-10-16 LAB — CBC WITH DIFFERENTIAL/PLATELET
Basophils Absolute: 0 10*3/uL (ref 0.0–0.1)
Basophils Relative: 0 % (ref 0–1)
EOS ABS: 0 10*3/uL (ref 0.0–0.7)
EOS PCT: 1 % (ref 0–5)
HCT: 43.3 % (ref 36.0–46.0)
HEMOGLOBIN: 14.7 g/dL (ref 12.0–15.0)
Lymphocytes Relative: 15 % (ref 12–46)
Lymphs Abs: 1 10*3/uL (ref 0.7–4.0)
MCH: 30.9 pg (ref 26.0–34.0)
MCHC: 33.9 g/dL (ref 30.0–36.0)
MCV: 91.2 fL (ref 78.0–100.0)
MONOS PCT: 10 % (ref 3–12)
Monocytes Absolute: 0.6 10*3/uL (ref 0.1–1.0)
Neutro Abs: 4.8 10*3/uL (ref 1.7–7.7)
Neutrophils Relative %: 74 % (ref 43–77)
PLATELETS: 193 10*3/uL (ref 150–400)
RBC: 4.75 MIL/uL (ref 3.87–5.11)
RDW: 12.4 % (ref 11.5–15.5)
WBC: 6.5 10*3/uL (ref 4.0–10.5)

## 2013-10-16 LAB — BASIC METABOLIC PANEL
BUN: 13 mg/dL (ref 6–23)
CALCIUM: 9.9 mg/dL (ref 8.4–10.5)
CO2: 26 meq/L (ref 19–32)
Chloride: 100 mEq/L (ref 96–112)
Creatinine, Ser: 0.81 mg/dL (ref 0.50–1.10)
GFR calc Af Amer: >90 mL/min (ref >90)
GFR calc non Af Amer: 84 mL/min — ABNORMAL LOW (ref >90)
GLUCOSE: 120 mg/dL — AB (ref 70–99)
Potassium: 4.5 mEq/L (ref 3.7–5.3)
SODIUM: 140 meq/L (ref 137–147)

## 2013-10-17 ENCOUNTER — Other Ambulatory Visit (INDEPENDENT_AMBULATORY_CARE_PROVIDER_SITE_OTHER): Payer: Self-pay | Admitting: General Surgery

## 2013-10-17 ENCOUNTER — Telehealth: Payer: Self-pay | Admitting: *Deleted

## 2013-10-17 DIAGNOSIS — N63 Unspecified lump in unspecified breast: Secondary | ICD-10-CM

## 2013-10-17 NOTE — Telephone Encounter (Signed)
Called Megan at [facility]- Dr. Jeri Cos office and was informed that the patient  Has to call and schedule the appointment with them.  Left detailed information for the patient on her cell phone.

## 2013-10-21 ENCOUNTER — Ambulatory Visit
Admission: RE | Admit: 2013-10-21 | Discharge: 2013-10-21 | Disposition: A | Discharge status: Home / Self Care | Payer: 59 | Source: Ambulatory Visit | Attending: General Surgery | Admitting: General Surgery

## 2013-10-21 ENCOUNTER — Ambulatory Visit (HOSPITAL_BASED_OUTPATIENT_CLINIC_OR_DEPARTMENT_OTHER)
Admission: RE | Admit: 2013-10-21 | Discharge: 2013-10-21 | Disposition: A | Discharge status: Home / Self Care | Payer: 59 | Source: Ambulatory Visit | Attending: General Surgery | Admitting: General Surgery

## 2013-10-21 ENCOUNTER — Ambulatory Visit (HOSPITAL_BASED_OUTPATIENT_CLINIC_OR_DEPARTMENT_OTHER): Payer: 59 | Admitting: Anesthesiology

## 2013-10-21 ENCOUNTER — Encounter (HOSPITAL_BASED_OUTPATIENT_CLINIC_OR_DEPARTMENT_OTHER)
Admission: RE | Disposition: A | Discharge status: Home / Self Care | Payer: Self-pay | Source: Ambulatory Visit | Attending: General Surgery

## 2013-10-21 ENCOUNTER — Encounter (HOSPITAL_BASED_OUTPATIENT_CLINIC_OR_DEPARTMENT_OTHER): Payer: Self-pay

## 2013-10-21 ENCOUNTER — Encounter (HOSPITAL_BASED_OUTPATIENT_CLINIC_OR_DEPARTMENT_OTHER): Payer: 59 | Admitting: Anesthesiology

## 2013-10-21 DIAGNOSIS — I7300 Raynaud's syndrome without gangrene: Secondary | ICD-10-CM | POA: Insufficient documentation

## 2013-10-21 DIAGNOSIS — N6089 Other benign mammary dysplasias of unspecified breast: Secondary | ICD-10-CM | POA: Insufficient documentation

## 2013-10-21 DIAGNOSIS — R928 Other abnormal and inconclusive findings on diagnostic imaging of breast: Secondary | ICD-10-CM | POA: Insufficient documentation

## 2013-10-21 DIAGNOSIS — N63 Unspecified lump in unspecified breast: Secondary | ICD-10-CM

## 2013-10-21 DIAGNOSIS — G4730 Sleep apnea, unspecified: Secondary | ICD-10-CM

## 2013-10-21 DIAGNOSIS — E7800 Pure hypercholesterolemia, unspecified: Secondary | ICD-10-CM | POA: Insufficient documentation

## 2013-10-21 DIAGNOSIS — R920 Mammographic microcalcification found on diagnostic imaging of breast: Secondary | ICD-10-CM

## 2013-10-21 DIAGNOSIS — D4860 Neoplasm of uncertain behavior of unspecified breast: Secondary | ICD-10-CM

## 2013-10-21 DIAGNOSIS — G4700 Insomnia, unspecified: Secondary | ICD-10-CM | POA: Insufficient documentation

## 2013-10-21 DIAGNOSIS — Z87891 Personal history of nicotine dependence: Secondary | ICD-10-CM | POA: Insufficient documentation

## 2013-10-21 DIAGNOSIS — R011 Cardiac murmur, unspecified: Secondary | ICD-10-CM | POA: Insufficient documentation

## 2013-10-21 HISTORY — DX: Other specified postprocedural states: R11.2

## 2013-10-21 HISTORY — DX: Other specified postprocedural states: Z98.890

## 2013-10-21 SURGERY — RADIOACTIVE SEED GUIDED BREAST BIOPSY
Anesthesia: General | Laterality: Left

## 2013-10-21 MED ORDER — MIDAZOLAM HCL 2 MG/2ML IJ SOLN
INTRAMUSCULAR | Status: AC
  Filled 2013-10-21: qty 2

## 2013-10-21 MED ORDER — MIDAZOLAM HCL 2 MG/2ML IJ SOLN: 1.0000 mg | INTRAMUSCULAR | Status: DC | PRN

## 2013-10-21 MED ORDER — SCOPOLAMINE 1 MG/3DAYS TD PT72
1.0000 | MEDICATED_PATCH | TRANSDERMAL | Status: DC
  Administered 2013-10-21: 1.5 mg via TRANSDERMAL

## 2013-10-21 MED ORDER — CEFAZOLIN SODIUM-DEXTROSE 2-3 GM-% IV SOLR
INTRAVENOUS | Status: AC
  Filled 2013-10-21: qty 50

## 2013-10-21 MED ORDER — BUPIVACAINE HCL (PF) 0.25 % IJ SOLN
INTRAMUSCULAR | Status: AC
  Filled 2013-10-21: qty 30

## 2013-10-21 MED ORDER — ONDANSETRON HCL 4 MG/2ML IJ SOLN
INTRAMUSCULAR | Status: DC | PRN
  Administered 2013-10-21: 4 mg via INTRAVENOUS

## 2013-10-21 MED ORDER — ACETAMINOPHEN 10 MG/ML IV SOLN
1000.0000 mg | Freq: Once | INTRAVENOUS | Status: AC
  Administered 2013-10-21 (×2): 1000 mg via INTRAVENOUS

## 2013-10-21 MED ORDER — PROPOFOL 10 MG/ML IV BOLUS
INTRAVENOUS | Status: DC | PRN
  Administered 2013-10-21: 250 mg via INTRAVENOUS

## 2013-10-21 MED ORDER — SCOPOLAMINE 1 MG/3DAYS TD PT72
MEDICATED_PATCH | TRANSDERMAL | Status: AC
  Filled 2013-10-21: qty 1

## 2013-10-21 MED ORDER — ACETAMINOPHEN 10 MG/ML IV SOLN
INTRAVENOUS | Status: AC
  Filled 2013-10-21: qty 100

## 2013-10-21 MED ORDER — FENTANYL CITRATE 0.05 MG/ML IJ SOLN
INTRAMUSCULAR | Status: AC
  Filled 2013-10-21: qty 6

## 2013-10-21 MED ORDER — MIDAZOLAM HCL 5 MG/5ML IJ SOLN
INTRAMUSCULAR | Status: DC | PRN
  Administered 2013-10-21: 2 mg via INTRAVENOUS

## 2013-10-21 MED ORDER — LACTATED RINGERS IV SOLN
INTRAVENOUS | Status: DC
  Administered 2013-10-21: 07:00:00 via INTRAVENOUS

## 2013-10-21 MED ORDER — BUPIVACAINE HCL (PF) 0.25 % IJ SOLN
INTRAMUSCULAR | Status: DC | PRN
  Administered 2013-10-21: 10 mL

## 2013-10-21 MED ORDER — FENTANYL CITRATE 0.05 MG/ML IJ SOLN
INTRAMUSCULAR | Status: DC | PRN
  Administered 2013-10-21: 100 ug via INTRAVENOUS

## 2013-10-21 MED ORDER — METHOCARBAMOL 500 MG PO TABS: 500.0000 mg | ORAL_TABLET | Freq: Four times a day (QID) | ORAL | Status: DC

## 2013-10-21 MED ORDER — LIDOCAINE HCL (CARDIAC) 20 MG/ML IV SOLN
INTRAVENOUS | Status: DC | PRN
  Administered 2013-10-21: 75 mg via INTRAVENOUS

## 2013-10-21 MED ORDER — CEFAZOLIN SODIUM-DEXTROSE 2-3 GM-% IV SOLR
2.0000 g | INTRAVENOUS | Status: AC
  Administered 2013-10-21: 2 g via INTRAVENOUS

## 2013-10-21 MED ORDER — DEXAMETHASONE SODIUM PHOSPHATE 4 MG/ML IJ SOLN
INTRAMUSCULAR | Status: DC | PRN
  Administered 2013-10-21: 10 mg via INTRAVENOUS

## 2013-10-21 MED ORDER — FENTANYL CITRATE 0.05 MG/ML IJ SOLN: 50.0000 ug | INTRAMUSCULAR | Status: DC | PRN

## 2013-10-21 SURGICAL SUPPLY — 63 items
ADH SKN CLS APL DERMABOND .7 (GAUZE/BANDAGES/DRESSINGS) ×1
APL SKNCLS STERI-STRIP NONHPOA (GAUZE/BANDAGES/DRESSINGS) ×1
APPLIER CLIP 9.375 MED OPEN (MISCELLANEOUS)
APR CLP MED 9.3 20 MLT OPN (MISCELLANEOUS)
BENZOIN TINCTURE PRP APPL 2/3 (GAUZE/BANDAGES/DRESSINGS) ×3 IMPLANT
BINDER BREAST LRG (GAUZE/BANDAGES/DRESSINGS) IMPLANT
BINDER BREAST MEDIUM (GAUZE/BANDAGES/DRESSINGS) ×2 IMPLANT
BINDER BREAST XLRG (GAUZE/BANDAGES/DRESSINGS) IMPLANT
BINDER BREAST XXLRG (GAUZE/BANDAGES/DRESSINGS) IMPLANT
BLADE SURG 15 STRL LF DISP TIS (BLADE) ×1 IMPLANT
BLADE SURG 15 STRL SS (BLADE) ×3
CANISTER SUC SOCK COL 7IN (MISCELLANEOUS) IMPLANT
CANISTER SUCT 1200ML W/VALVE (MISCELLANEOUS) IMPLANT
CHLORAPREP W/TINT 26ML (MISCELLANEOUS) ×3 IMPLANT
CLIP APPLIE 9.375 MED OPEN (MISCELLANEOUS) IMPLANT
CLOSURE WOUND 1/2 X4 (GAUZE/BANDAGES/DRESSINGS) ×1
COVER MAYO STAND STRL (DRAPES) ×3 IMPLANT
COVER PROBE W GEL 5X96 (DRAPES) ×3 IMPLANT
COVER TABLE BACK 60X90 (DRAPES) ×3 IMPLANT
DECANTER SPIKE VIAL GLASS SM (MISCELLANEOUS) ×2 IMPLANT
DERMABOND ADVANCED (GAUZE/BANDAGES/DRESSINGS) ×2
DERMABOND ADVANCED .7 DNX12 (GAUZE/BANDAGES/DRESSINGS) ×1 IMPLANT
DEVICE DUBIN W/COMP PLATE 8390 (MISCELLANEOUS) ×3 IMPLANT
DRAPE PED LAPAROTOMY (DRAPES) ×3 IMPLANT
DRSG TEGADERM 4X4.75 (GAUZE/BANDAGES/DRESSINGS) IMPLANT
ELECT COATED BLADE 2.86 ST (ELECTRODE) ×3 IMPLANT
ELECT REM PT RETURN 9FT ADLT (ELECTROSURGICAL) ×3
ELECTRODE REM PT RTRN 9FT ADLT (ELECTROSURGICAL) ×1 IMPLANT
GLOVE BIO SURGEON STRL SZ7 (GLOVE) ×3 IMPLANT
GLOVE BIOGEL PI IND STRL 7.0 (GLOVE) IMPLANT
GLOVE BIOGEL PI IND STRL 7.5 (GLOVE) ×1 IMPLANT
GLOVE BIOGEL PI INDICATOR 7.0 (GLOVE) ×2
GLOVE BIOGEL PI INDICATOR 7.5 (GLOVE) ×2
GLOVE EXAM NITRILE MD LF STRL (GLOVE) ×2 IMPLANT
GLOVE SURG SS PI 6.5 STRL IVOR (GLOVE) ×2 IMPLANT
GOWN STRL REUS W/ TWL LRG LVL3 (GOWN DISPOSABLE) ×2 IMPLANT
GOWN STRL REUS W/TWL LRG LVL3 (GOWN DISPOSABLE) ×6
KIT MARKER MARGIN INK (KITS) ×3 IMPLANT
NDL HYPO 25X1 1.5 SAFETY (NEEDLE) IMPLANT
NEEDLE HYPO 25X1 1.5 SAFETY (NEEDLE) ×3 IMPLANT
NS IRRIG 1000ML POUR BTL (IV SOLUTION) IMPLANT
PACK BASIN DAY SURGERY FS (CUSTOM PROCEDURE TRAY) ×3 IMPLANT
PENCIL BUTTON HOLSTER BLD 10FT (ELECTRODE) ×3 IMPLANT
SHEET MEDIUM DRAPE 40X70 STRL (DRAPES) IMPLANT
SLEEVE SCD COMPRESS KNEE MED (MISCELLANEOUS) ×3 IMPLANT
SPONGE GAUZE 4X4 12PLY STER LF (GAUZE/BANDAGES/DRESSINGS) ×3 IMPLANT
SPONGE LAP 4X18 X RAY DECT (DISPOSABLE) ×1 IMPLANT
STAPLER VISISTAT 35W (STAPLE) ×3 IMPLANT
STRIP CLOSURE SKIN 1/2X4 (GAUZE/BANDAGES/DRESSINGS) ×2 IMPLANT
SUT MNCRL AB 4-0 PS2 18 (SUTURE) ×3 IMPLANT
SUT MON AB 5-0 PS2 18 (SUTURE) IMPLANT
SUT SILK 2 0 SH (SUTURE) IMPLANT
SUT VIC AB 2-0 SH 27 (SUTURE) ×3
SUT VIC AB 2-0 SH 27XBRD (SUTURE) ×1 IMPLANT
SUT VIC AB 3-0 SH 27 (SUTURE) ×3
SUT VIC AB 3-0 SH 27X BRD (SUTURE) ×1 IMPLANT
SUT VIC AB 5-0 PS2 18 (SUTURE) IMPLANT
SYR CONTROL 10ML LL (SYRINGE) ×3 IMPLANT
TOWEL OR 17X24 6PK STRL BLUE (TOWEL DISPOSABLE) ×3 IMPLANT
TOWEL OR NON WOVEN STRL DISP B (DISPOSABLE) ×3 IMPLANT
TUBE CONNECTING 20'X1/4 (TUBING)
TUBE CONNECTING 20X1/4 (TUBING) IMPLANT
YANKAUER SUCT BULB TIP NO VENT (SUCTIONS) IMPLANT

## 2013-10-21 NOTE — Anesthesia Procedure Notes (Signed)
Procedure Name: LMA Insertion Date/Time: 10/21/2013 7:47 AM Performed by: Melynda Ripple D Pre-anesthesia Checklist: Patient identified, Emergency Drugs available, Suction available and Patient being monitored Patient Re-evaluated:Patient Re-evaluated prior to inductionOxygen Delivery Method: Circle System Utilized Preoxygenation: Pre-oxygenation with 100% oxygen Intubation Type: IV induction Ventilation: Mask ventilation without difficulty LMA: LMA inserted LMA Size: 4.0 Grade View: Grade I Number of attempts: 1 Airway Equipment and Method: bite block Placement Confirmation: positive ETCO2 Tube secured with: Tape Dental Injury: Teeth and Oropharynx as per pre-operative assessment

## 2013-10-21 NOTE — Transfer of Care (Signed)
Immediate Anesthesia Transfer of Care Note  Patient: Katherine [name]  Procedure(s) Performed: Procedure(s): LEFT BREAST SEED GUIDED EXCISION (Left)  Patient Location: PACU  Anesthesia Type:General  Level of Consciousness: awake, alert  and oriented  Airway & Oxygen Therapy: Patient Spontanous Breathing and Patient connected to nasal cannula oxygen  Post-op Assessment: Report given to PACU RN  Post vital signs: Reviewed and stable  Complications: No apparent anesthesia complications

## 2013-10-21 NOTE — Interval H&P Note (Signed)
History and Physical Interval Note:  10/21/2013 7:35 AM  Katherine [name]  has presented today for surgery, with the diagnosis of left breast mass  The various methods of treatment have been discussed with the patient and family. After consideration of risks, benefits and other options for treatment, the patient has consented to  Procedure(s): LEFT BREAST SEED GUIDED EXCISION (Left) as a surgical intervention .  The patient's history has been reviewed, patient examined, no change in status, stable for surgery.  I have reviewed the patient's chart and labs.  Questions were answered to the patient's satisfaction.     Damaris Geers

## 2013-10-21 NOTE — H&P (View-Only) (Signed)
Patient ID: Katherine [name], female   DOB: 10/04/1965, 47 y.o.   MRN: [id number]  Chief Complaint  Patient presents with  . lt alh    new pt    HPI Katherine [name] is a 47 y.o. female.  Referred by Dr Jennifer Jarosz HPI This is a 47-year-old female who is otherwise healthy who presents after undergoing a screening tomo mammogram shows breast density category C. There is a stable group of punctate calcifications in the right breast measuring 2 mm and it had been stable for a year now. There is also a group of punctate calcifications in the upper outer left breast spanning 6 mm. These appear slightly increased. She has no complaints referable to either breasts. She was offered a 6 month followup of these probably benign left breast calcifications she requested a stereotactic biopsy. Stereotactic biopsy of the left breast lesions were then performed with the findings being atypical lobular hyperplasia. She was then referred for evaluation. She is adopted and does not know all of her family history. She has been maintained on estrogen for 1-1/2 years now. Past Medical History  Diagnosis Date  . Raynaud's syndrome   . Pure hypercholesterolemia   . Heart murmur   . Plantar fasciitis   . Insomnia   . Insomnia, persistent 08/28/2013    Menopausal induced insomnia, early morning awakenings, and snoring.   . 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    Past Surgical History  Procedure Laterality Date  . Cesarean section  1999  . Tonsillectomy and adenoidectomy  1981  . Uterine fibroidectomy      @ c section  . Plantar fasciitis surgery  2011, 2007    Family History  Problem Relation Age of Onset  . Adopted: Yes  . Heart attack Maternal Uncle 37  . Cervical cancer Maternal Grandmother   . Stroke Mother   . Diabetes Brother   . Depression Brother     1/2 brother  . Hypertension    . Heart Problems      arrhythmia    Social History History  Substance Use Topics  . Smoking status: Former Smoker  .  Smokeless tobacco: Not on file     Comment: about age 26, 1995  . Alcohol Use: Yes     Comment: occasionally     Allergies  Allergen Reactions  . Ultram [Tramadol]   . Hydrocodone-Acetaminophen     REACTION: NAUSEA  . Oxycodone-Acetaminophen     REACTION: NAUSEA    Current Outpatient Prescriptions  Medication Sig Dispense Refill  . AMBULATORY NON FORMULARY MEDICATION Medication Name: Zyflamend whole body (anit-inflammatory): 2 by mouth every other day      . B Complex-Folic Acid (B COMPLEX PLUS PO) Take by mouth daily.      . estradiol-norethindrone (ACTIVELLA) 1-0.5 MG per tablet Take 1 tablet by mouth daily.      . Estradiol-Norethindrone Acet (ACTIVELLA) 0.5-0.1 MG per tablet Take 1 tablet by mouth daily.      . Multiple Vitamins-Minerals (CENTRUM SILVER PO) Take by mouth daily.      . NIFEdipine (PROCARDIA XL) 30 MG 24 hr tablet Take 1 tablet (30 mg total) by mouth daily.  30 tablet  5  . nitrofurantoin, macrocrystal-monohydrate, (MACROBID) 100 MG capsule Take 1 capsule (100 mg total) by mouth 2 (two) times daily.  14 capsule  0  . zaleplon (SONATA) 10 MG capsule TAKE 1 CAPSULE BY MOUTH ONCE DAILY IN THE EVENING  30 capsule  0     No current facility-administered medications for this visit.    Review of Systems Review of Systems  Constitutional: Negative for fever, chills and unexpected weight change.  HENT: Negative for congestion, hearing loss, sore throat, trouble swallowing and voice change.   Eyes: Negative for visual disturbance.  Respiratory: Negative for cough and wheezing.   Cardiovascular: Positive for palpitations. Negative for chest pain and leg swelling.  Gastrointestinal: Negative for nausea, vomiting, abdominal pain, diarrhea, constipation, blood in stool, abdominal distention and anal bleeding.  Genitourinary: Negative for hematuria, vaginal bleeding and difficulty urinating.  Musculoskeletal: Negative for arthralgias.  Skin: Negative for rash and wound.    Neurological: Negative for seizures, syncope and headaches.  Hematological: Negative for adenopathy. Does not bruise/bleed easily.  Psychiatric/Behavioral: Negative for confusion.    Blood pressure 122/80, pulse 77, temperature 97.8 F (36.6 C), temperature source Oral, height 5' 6" (1.676 m), weight 135 lb (61.236 kg).  Physical Exam Physical Exam  Vitals reviewed. Constitutional: She appears well-developed and well-nourished.  Neck: Neck supple.  Cardiovascular: Normal rate, regular rhythm and normal heart sounds.   Pulmonary/Chest: Effort normal and breath sounds normal. She has no wheezes. She has no rales. Right breast exhibits no inverted nipple, no mass, no nipple discharge, no skin change and no tenderness. Left breast exhibits no inverted nipple, no mass, no nipple discharge, no skin change and no tenderness.    Lymphadenopathy:    She has no cervical adenopathy.    Data Reviewed XAM:  DIGITAL DIAGNOSTIC BILATERAL MAMMOGRAM WITH CAD  COMPARISON: Previous exams.  ACR Breast Density Category c: The breast tissue is heterogeneously  dense, which may obscure small masses.  FINDINGS:  There is a stable group of round and punctate calcifications in the  superior right breast spanning a distance of 2 mm and confirmed on  the additional spot compression magnification CC and mL views.  There is a group of punctate calcifications in the upper-outer left  breast spanning a distance of 6 mm confirmed on the spot compression  magnification CC and mL views. These appear slightly increased in  number when compared to the prior exam, however this is felt to be a  result of motion obscuring the calcifications on the prior exam  rather than a true increase in number of calcifications.  Mammographic images were processed with CAD.  IMPRESSION:  1. Stable probably benign right breast calcifications.  2. Probably benign left breast calcifications, which appear slightly  increased in  number when compared to the prior exam, however this is  felt to be a result of motion obscuring the calcifications on the  prior exam rather than a true increase in number of the  calcifications.  RECOMMENDATION:  1. Six-month followup of the probably benign right breast  calcifications which will demonstrate a total of 1 year of  stability.  2. An additional six-month follow-up of the probably benign left  breast calcifications was offered to the patient, however she  requests stereotactic biopsy. This is scheduled for February 26 at 8  a.m.   Assessment    Left breast atypical lobular hyperplasia on core biopsy     Plan    Left breast radioactive seed guided excisional biopsy  We discussed her pathology rolled resulting atypical lobular hyperplasia. She is other areas on the right side are abnormal but stable. I discussed the indications for excisional biopsy being to rule out cancer at that site. We discussed the performance of this with a radioactive seed as well as   the postoperative restrictions. We discussed the risks associated with it also. She asked about what this would mean in terms of her moving forward for risk. I discussed at a minimum I would send her to go see one of the oncologists for risk reduction evaluation. If this is a been something more we will certainly have a discussion about some further evaluation as well as further surgery. She actually asked about bilateral mastectomy or unilateral mastectomy in the face of atypical lobular hyperplasia. I think that that would not be the best course of action at this least at this point I told her that there was not any evidence of there will be any survival advantage to her. I also told her that mastectomy is not longer present prevented developing breast cancer. We're to proceed with this excision and then go from there.    we will wait a week or two due to significant hematoma.    Carmon Sahli 10/07/2013, 5:18  PM

## 2013-10-21 NOTE — Discharge Instructions (Signed)
[facility] °Office Phone Number [phone] ° °BREAST BIOPSY/ PARTIAL MASTECTOMY: POST OP INSTRUCTIONS ° °Always review your discharge instruction sheet given to you by the facility where your surgery was performed. ° °IF YOU HAVE DISABILITY OR FAMILY LEAVE FORMS, YOU MUST BRING THEM TO THE OFFICE FOR PROCESSING.  DO NOT GIVE THEM TO YOUR DOCTOR. ° °1. A prescription for pain medication may be given to you upon discharge.  Take your pain medication as prescribed, if needed.  If narcotic pain medicine is not needed, then you may take acetaminophen (Tylenol), naprosyn (Alleve) or ibuprofen (Advil) as needed. °2. Take your usually prescribed medications unless otherwise directed °3. If you need a refill on your pain medication, please contact your pharmacy.  They will contact our office to request authorization.  Prescriptions will not be filled after 5pm or on week-ends. °4. You should eat very light the first 24 hours after surgery, such as soup, crackers, pudding, etc.  Resume your normal diet the day after surgery. °5. Most patients will experience some swelling and bruising in the breast.  Ice packs and a good support bra will help.  Wear the breast binder provided or a sports bra for 72 hours day and night.  After that wear a sports bra during the day until you return to the office. Swelling and bruising can take several days to resolve.  °6. It is common to experience some constipation if taking pain medication after surgery.  Increasing fluid intake and taking a stool softener will usually help or prevent this problem from occurring.  A mild laxative (Milk of Magnesia or Miralax) should be taken according to package directions if there are no bowel movements after 48 hours. °7. Unless discharge instructions indicate otherwise, you may remove your bandages 48 hours after surgery and you may shower at that time.  You may have steri-strips (small skin tapes) in place directly over the incision.   These strips should be left on the skin for 7-10 days and will come off on their own.  If your surgeon used skin glue on the incision, you may shower in 24 hours.  The glue will flake off over the next 2-3 weeks.  Any sutures or staples will be removed at the office during your follow-up visit. °8. ACTIVITIES:  You may resume regular daily activities (gradually increasing) beginning the next day.  Wearing a good support bra or sports bra minimizes pain and swelling.  You may have sexual intercourse when it is comfortable. °a. You may drive when you no longer are taking prescription pain medication, you can comfortably wear a seatbelt, and you can safely maneuver your car and apply brakes. °b. RETURN TO WORK:  ______________________________________________________________________________________ °9. You should see your doctor in the office for a follow-up appointment approximately two weeks after your surgery.  Your doctor’s nurse will typically make your follow-up appointment when she calls you with your pathology report.  Expect your pathology report 3-4 business days after your surgery.  You may call to check if you do not hear from us after three days. °10. OTHER INSTRUCTIONS: _______________________________________________________________________________________________ _____________________________________________________________________________________________________________________________________ °_____________________________________________________________________________________________________________________________________ °_____________________________________________________________________________________________________________________________________ ° °WHEN TO CALL DR WAKEFIELD: °1. Fever over 101.0 °2. Nausea and/or vomiting. °3. Extreme swelling or bruising. °4. Continued bleeding from incision. °5. Increased pain, redness, or drainage from the incision. ° °The clinic staff is available to  answer your questions during regular business hours.  Please don’t hesitate to call and ask to speak to one of the nurses for   clinical concerns.  If you have a medical emergency, go to the nearest emergency room or call 911.  A surgeon from [facility] is always on call at the hospital. ° °For further questions, please visit [email or web address] ° °Post Anesthesia Home Care Instructions ° °Activity: °Get plenty of rest for the remainder of the day. A responsible adult should stay with you for 24 hours following the procedure.  °For the next 24 hours, DO NOT: °-Drive a car °-Operate machinery °-Drink alcoholic beverages °-Take any medication unless instructed by your physician °-Make any legal decisions or sign important papers. ° °Meals: °Start with liquid foods such as gelatin or soup. Progress to regular foods as tolerated. Avoid greasy, spicy, heavy foods. If nausea and/or vomiting occur, drink only clear liquids until the nausea and/or vomiting subsides. Call your physician if vomiting continues. ° °Special Instructions/Symptoms: °Your throat may feel dry or sore from the anesthesia or the breathing tube placed in your throat during surgery. If this causes discomfort, gargle with warm salt water. The discomfort should disappear within 24 hours. ° °

## 2013-10-21 NOTE — Anesthesia Postprocedure Evaluation (Signed)
  Anesthesia Post-op Note  Patient: Katherine [name]  Procedure(s) Performed: Procedure(s): LEFT BREAST SEED GUIDED EXCISION (Left)  Patient Location: PACU  Anesthesia Type:General  Level of Consciousness: awake, alert  and oriented  Airway and Oxygen Therapy: Patient Spontanous Breathing  Post-op Pain: mild  Post-op Assessment: Post-op Vital signs reviewed  Post-op Vital Signs: Reviewed  Complications: No apparent anesthesia complications

## 2013-10-21 NOTE — Op Note (Signed)
Preoperative diagnosis: Left breast core biopsy of atypical lobular hyperplasia, abnormal calcifications Postoperative diagnosis: Same as above Procedure: Left breast radioactive seed guided excisional biopsy Surgeon: Dr. Serita Grammes Anesthesia: Gen. Estimated blood loss: Minimal Consultations: None Drains: None Specimens: Left breast tissue marked with paint Sponge and needle count correct at completion Disposition to recovery stable  Indications: This is a 48 year old female underwent her screening mammogram with the finding of left breast microcalcifications. She elected to undergo a stereotactic biopsy of these calcifications which showed atypical lobular hyperplasia. I saw her and we discussed excision of these using radioactive seed guidance.  Procedure: She had her seed placed prior to surgery. I had these mammograms in the operating room. After informed consent was obtained the patient was taken to the operating room. She was given cefazolin. Sequential compression devices were legged. She was placed under general anesthesia. Her left breast was prepped and draped in the standard sterile surgical fashion. A surgical timeout was then performed.  I located the radioactive seed with the neoprobe. I then made a curvilinear incision overlying this. There is still a small hematoma in this position. I then used the neoprobe to guide the excision of the seed as well as the surrounding tissue. This was then passed off the table as a specimen. The seed was confirmed to be in the tissue with the neoprobe. There was no radioactivity remaining in the breast. This was then marked with paint for pathology. I then did a mammogram which confirmed removal of the seed and clip. This was confirmed by radiology. We then sent the seed to pathology. I then obtained hemostasis. The wound was closed with 2-0 Vicryl, 3-0 Vicryl, 4-0 Monocryl. Dermabond Steri-Strips are placed. Marcaine was infiltrated throughout  the wound. She tolerated this well was extubated and transferred to recovery stable.

## 2013-10-21 NOTE — Anesthesia Preprocedure Evaluation (Signed)
Anesthesia Evaluation  Patient identified by MRN, date of birth, ID band Patient awake    Reviewed: Allergy & Precautions, H&P , NPO status , Patient's Chart, lab work & pertinent test results  History of Anesthesia Complications (+) PONV  Airway Mallampati: I TM Distance: >3 FB Neck ROM: Full    Dental  (+) Teeth Intact, Dental Advisory Given   Pulmonary former smoker,  breath sounds clear to auscultation        Cardiovascular Rhythm:Regular Rate:Normal     Neuro/Psych    GI/Hepatic   Endo/Other    Renal/GU      Musculoskeletal   Abdominal   Peds  Hematology   Anesthesia Other Findings Recent dx of mild OSA with only one episode of desat to 87%. Waiting on oral appliance.    Reproductive/Obstetrics                           Anesthesia Physical Anesthesia Plan  ASA: II  Anesthesia Plan: General   Post-op Pain Management:    Induction: Intravenous  Airway Management Planned: LMA  Additional Equipment:   Intra-op Plan:   Post-operative Plan: Extubation in OR  Informed Consent: I have reviewed the patients History and Physical, chart, labs and discussed the procedure including the risks, benefits and alternatives for the proposed anesthesia with the patient or authorized representative who has indicated his/her understanding and acceptance.   Dental advisory given  Plan Discussed with: CRNA, Anesthesiologist and Surgeon  Anesthesia Plan Comments: (I think outpatient anesthesia is warranted and will place Scop patch.  )        Anesthesia Quick Evaluation

## 2013-10-22 ENCOUNTER — Encounter: Payer: Self-pay | Admitting: Internal Medicine

## 2013-10-22 DIAGNOSIS — N6099 Unspecified benign mammary dysplasia of unspecified breast: Secondary | ICD-10-CM | POA: Insufficient documentation

## 2013-10-25 ENCOUNTER — Other Ambulatory Visit: Payer: Self-pay | Admitting: Internal Medicine

## 2013-10-25 NOTE — Telephone Encounter (Signed)
Rx sent to the pharmacy by e-script.  Pt needs complete physical and fasting labs.//AB/CMA 

## 2013-10-30 ENCOUNTER — Encounter (INDEPENDENT_AMBULATORY_CARE_PROVIDER_SITE_OTHER): Payer: Commercial Managed Care - PPO | Admitting: General Surgery

## 2013-11-15 ENCOUNTER — Encounter (INDEPENDENT_AMBULATORY_CARE_PROVIDER_SITE_OTHER): Payer: Self-pay | Admitting: General Surgery

## 2013-11-15 ENCOUNTER — Ambulatory Visit (INDEPENDENT_AMBULATORY_CARE_PROVIDER_SITE_OTHER): Payer: Commercial Managed Care - PPO | Admitting: General Surgery

## 2013-11-15 VITALS — BP 124/78 | HR 80 | Temp 97.4°F | Resp 12 | Wt 138.4 lb

## 2013-11-15 DIAGNOSIS — Z09 Encounter for follow-up examination after completed treatment for conditions other than malignant neoplasm: Secondary | ICD-10-CM

## 2013-11-15 NOTE — Progress Notes (Signed)
Subjective:     Patient ID: Katherine [name], female   DOB: 08/29/1965, 48 y.o.   MRN: [id number]  HPI This is a 48 year old female who underwent a left breast radioactive seed guided excision of what was on a core biopsy atypical lobular hyperplasia. This is what her final pathology is as well. She returns today doing well except she had some separation of her incision where it appears a stitch was. She otherwise has no complaints today. She has found out some more about her family history she is adopted and there is a history of breast cancer in a great-grandmother as well as some other more distant relatives.  Review of Systems     Objective:   Physical Exam Left breast with a healing incision without infection, there is some superficial separation medially that I think is from the stitch    Assessment:     Atypical lobular hyperplasia     Plan:     We discussed that this is not cancer. This does however place her at high risk for developing cancer. I told her that we could have her be seen by medical oncology to discuss risk reduction including tamoxifen. She knows Dr. Marko Plume and would like very much  to see her and is going to contact her also.  We're to manage the incision conservatively with topical antibiotics and await this to heal. She's going to call me back if this is not healing are getting better in the next couple weeks and I see her back.

## 2013-11-19 ENCOUNTER — Telehealth: Payer: Self-pay | Admitting: *Deleted

## 2013-11-19 NOTE — Telephone Encounter (Signed)
Left message for pt to return my call so I can schedule a med onc appt. 

## 2013-11-20 ENCOUNTER — Telehealth: Payer: Self-pay | Admitting: *Deleted

## 2013-11-20 HISTORY — PX: BREAST SURGERY: SHX581

## 2013-11-20 NOTE — Telephone Encounter (Signed)
Called pt & confirmed 12/11/13 appt w/ her.  Emailed Dr. Marko Plume to make her aware.  Emailed Anderson Malta, Lars Mage & Dr. Donne Hazel at [place] to make them aware.   Took paperwork to Med [facility] for chart.

## 2013-11-29 ENCOUNTER — Telehealth (INDEPENDENT_AMBULATORY_CARE_PROVIDER_SITE_OTHER): Payer: Self-pay

## 2013-11-29 NOTE — Telephone Encounter (Signed)
Message copied by Illene Regulus on Fri Nov 29, 2013  5:00 PM ------      Message from: Rodey, Rutledge: Thu Nov 28, 2013 10:18 AM      Contact: [phone]       SHE HAD SURGERY ON 10/31/13 AND IS STILL HAVING SOME PROBLEMS AND THE NEXT APPT IS NOT UNTIL June PLEASE CALL HER. ------

## 2013-11-29 NOTE — Telephone Encounter (Signed)
LMOM for pt to call me b/c I want to see what problems pt is having with the breast sx. I made her an appt to see Dr Donne Hazel on 12/10/13 arrive at 4:15/4:30.

## 2013-12-10 ENCOUNTER — Encounter (INDEPENDENT_AMBULATORY_CARE_PROVIDER_SITE_OTHER): Payer: Commercial Managed Care - PPO | Admitting: General Surgery

## 2013-12-10 ENCOUNTER — Other Ambulatory Visit: Payer: Self-pay | Admitting: *Deleted

## 2013-12-10 DIAGNOSIS — N6099 Unspecified benign mammary dysplasia of unspecified breast: Secondary | ICD-10-CM

## 2013-12-11 ENCOUNTER — Encounter: Payer: Self-pay | Admitting: Oncology

## 2013-12-11 ENCOUNTER — Other Ambulatory Visit: Payer: Self-pay | Admitting: *Deleted

## 2013-12-11 ENCOUNTER — Ambulatory Visit (HOSPITAL_BASED_OUTPATIENT_CLINIC_OR_DEPARTMENT_OTHER): Payer: 59 | Admitting: Oncology

## 2013-12-11 ENCOUNTER — Telehealth: Payer: Self-pay | Admitting: Oncology

## 2013-12-11 ENCOUNTER — Ambulatory Visit: Payer: 59

## 2013-12-11 ENCOUNTER — Other Ambulatory Visit (HOSPITAL_BASED_OUTPATIENT_CLINIC_OR_DEPARTMENT_OTHER): Payer: 59

## 2013-12-11 VITALS — BP 148/88 | HR 73 | Temp 98.8°F | Resp 18 | Ht 65.25 in | Wt 134.0 lb

## 2013-12-11 DIAGNOSIS — N62 Hypertrophy of breast: Secondary | ICD-10-CM

## 2013-12-11 DIAGNOSIS — N6092 Unspecified benign mammary dysplasia of left breast: Secondary | ICD-10-CM

## 2013-12-11 DIAGNOSIS — N6099 Unspecified benign mammary dysplasia of unspecified breast: Secondary | ICD-10-CM

## 2013-12-11 LAB — CBC WITH DIFFERENTIAL/PLATELET
BASO%: 0.7 % (ref 0.0–2.0)
Basophils Absolute: 0 10*3/uL (ref 0.0–0.1)
EOS%: 0.8 % (ref 0.0–7.0)
Eosinophils Absolute: 0.1 10*3/uL (ref 0.0–0.5)
HCT: 40.7 % (ref 34.8–46.6)
HGB: 13.6 g/dL (ref 11.6–15.9)
LYMPH%: 23.4 % (ref 14.0–49.7)
MCH: 30.5 pg (ref 25.1–34.0)
MCHC: 33.5 g/dL (ref 31.5–36.0)
MCV: 91 fL (ref 79.5–101.0)
MONO#: 0.7 10*3/uL (ref 0.1–0.9)
MONO%: 11.5 % (ref 0.0–14.0)
NEUT#: 4 10*3/uL (ref 1.5–6.5)
NEUT%: 63.6 % (ref 38.4–76.8)
PLATELETS: 208 10*3/uL (ref 145–400)
RBC: 4.47 10*6/uL (ref 3.70–5.45)
RDW: 12.4 % (ref 11.2–14.5)
WBC: 6.2 10*3/uL (ref 3.9–10.3)
lymph#: 1.5 10*3/uL (ref 0.9–3.3)

## 2013-12-11 LAB — COMPREHENSIVE METABOLIC PANEL (CC13)
ALK PHOS: 60 U/L (ref 40–150)
ALT: 15 U/L (ref 0–55)
AST: 22 U/L (ref 5–34)
Albumin: 4.1 g/dL (ref 3.5–5.0)
Anion Gap: 12 mEq/L — ABNORMAL HIGH (ref 3–11)
BUN: 14.2 mg/dL (ref 7.0–26.0)
CO2: 25 mEq/L (ref 22–29)
CREATININE: 1 mg/dL (ref 0.6–1.1)
Calcium: 9.8 mg/dL (ref 8.4–10.4)
Chloride: 106 mEq/L (ref 98–109)
Glucose: 109 mg/dl (ref 70–140)
Potassium: 4 mEq/L (ref 3.5–5.1)
SODIUM: 142 meq/L (ref 136–145)
TOTAL PROTEIN: 7.5 g/dL (ref 6.4–8.3)
Total Bilirubin: 0.4 mg/dL (ref 0.20–1.20)

## 2013-12-11 MED ORDER — TAMOXIFEN CITRATE 20 MG PO TABS: 20.0000 mg | ORAL_TABLET | Freq: Every day | ORAL | Status: DC

## 2013-12-11 NOTE — Telephone Encounter (Signed)
per pof to sch appt-gave pt copy of sch °

## 2013-12-11 NOTE — Progress Notes (Signed)
Checked in new pt with no financial concerns. °

## 2013-12-11 NOTE — Progress Notes (Signed)
[place] NEW PATIENT EVALUATION   Name: Katherine [name] Date: 12/11/2013 MRN: [id number] DOB: Nov 25, 1965  REFERRING PHYSICIAN: Rolm Bookbinder CC: Unice Cobble; C.Dohmeier, Freda Munro   REASON FOR REFERRAL: new diagnosis atypical lobular hyperplasia   HISTORY OF PRESENT ILLNESS:Katherine [name] is a 48 y.o. female who is seen in consultation, alone for visit, at the request of Dr Donne Hazel, due to recently diagnosed atypical lobular hyperplasia left breast.   Patient had bilateral screening mammograms at [facility] 02-15-2013 with bilateral calcifications, followed up with bilateral diagnostic mammograms at [facility] 03-05-2013. Breast tissue is heterogeneously dense. She had 6 month follow up on 09-06-2013 (that report actually dated 10-29-13 in EMR) with slight increase in left breast calcifications and stable right breast calcifications thought likely benign; patient requested biopsy of the left breast rather than mammographic follow up. Left stereotactic needle biopsy 09-23-2013 (~11:00 , 7cm from nipple) found atypical lobular hyperplasia ([id number]). She was referred to Dr Donne Hazel, with left radioactive seed guided excisional lumpectomy on 10-21-2013. Pathology [phone]) found focal atypical lobular hyperplasia adjacent to the biopsy site, not involving margins. Specimen size was 5.2 x 4 x 1 to 2.2 cm and specimen radiograph confirmed seed and biopsy clip placement. She had local hematoma postoperatively,  which is progressively improving. She did not have MRI. She is to have 6 month follow up mammograms, including right.  Patient had been on estrogen x 1.5 years for severe hot flashes, this discontinued 3 months ago. Hot flashes are tolerable now using soy/flaxseed/ pomegranite supplement. Menstrual periods have been somewhat irregular, including none x 4 months, but again now have been regular with last on 11-21-13. Menses began age 1 First live birth age 44.  She exercises  at gym regularly, weights and aerobics. She does not smoke, tries to eat healty diet, which includes several calcium servings + one calcium with D tablet daily.     REVIEW OF SYSTEMS as above, also: Stress HAs. Grinds teeth and clinches jaw especially at night, regularly breaks bite blocks. Some gingivitis and bridge. No respiratory problems known. Has had sleep study due to longstanding sleep problems. No GERD or other GI problems. No bladder symptoms. No other bleeding. No arthritis. No LE swelling. No hx blood clots.  Remainder of full 10 point review of systems negative.   ALLERGIES: Ultram; Hydrocodone-acetaminophen; and Oxycodone-acetaminophen  PAST MEDICAL/ SURGICAL HISTORY:    Raynaud's x 6 years, worse in hands, procardia x1 year helpful Elevated cholesterol Plantar fasciitis with surgery on both feet at [place] in [facility] Mitral murmur, not symptomatic Csection and uterine fibroidectomy Insomnia Tonsils and adenoid surgery Bursitis right hip, right finger tendon problem. Has seen rheumatologist, false + RA screen No colonoscopy (age 64) Up to date on gyn exams with Dr Ouida Sills, last Nov 2014    CURRENT MEDICATIONS: reviewed as listed now in EMR. Ca + D one tab daily    [facility]   SOCIAL HISTORY:  Adopted with twin brother as infants, grew up in [place]. Married, husband is Dealer. Son Jeneen Rinks age 103, at [facility]. Smoked 1995 only. Occasional ETOH. 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  FAMILY HISTORY:   Little known about biological family, tho knows that mother had CVA, maternal uncle with MI, maternal grandmother possibly cervical cancer, half brother with DM. Twin brother with high cholesterol Son had thyroglossal  cyst         PHYSICAL EXAM:  height is 5' 5.25" (1.657 m) and weight is 134 lb (60.782 kg). Her  oral temperature is 98.8 F (37.1 C). Her blood pressure is 148/88 and her pulse is 73. Her respiration is 18. alert, pleasant, cooperative, appears healthy and stated age. BMI 22.2  HEENT:Normal hair pattern. PERRL, not icteric. Oral mucosa and posterior pharynx clear and moist. Neck supple without JVD or thyroid mass  RESPIRATORY: lungs clear to A and P  CARDIAC/ VASCULAR: RRR, no gallop, clear heart sounds. Peripheral pulses 2+  ABDOMEN: soft, nontender, not distended, normal bowel sounds, no HSM or mass  LYMPH NODES: no cervical, supraclavicular, axillary or inguinal adenopathy  BREASTS: left with fullness at lumpectomy site consistent with hematoma/ seroma, incision closed and not remarkable. Otherwise no dominant mass, no skin or nipple findings bilaterally. Axillae benign. No swelling UE.  NEUROLOGIC:CN, motor, sensory, cerebellar grossly nonfocal  SKIN: without rash, ecchymosis, petechiae  MUSCULOSKELETAL: no edema, cords, tenderness. Back not tender. Symmetrical muscle mass    LABORATORY DATA:  Results for orders placed in visit on 12/11/13 (from the past 48 hour(s))  CBC WITH DIFFERENTIAL     Status: None   Collection Time    12/11/13 11:06 AM      Result Value Ref Range   WBC 6.2  3.9 - 10.3 10e3/uL   NEUT# 4.0  1.5 - 6.5 10e3/uL   HGB 13.6  11.6 - 15.9 g/dL   HCT 40.7  34.8 - 46.6 %   Platelets 208  145 - 400 10e3/uL   MCV 91.0  79.5 - 101.0 fL   MCH 30.5  25.1 - 34.0 pg   MCHC 33.5  31.5 - 36.0 g/dL   RBC 4.47  3.70 - 5.45 10e6/uL   RDW 12.4  11.2 - 14.5 %   lymph# 1.5  0.9 - 3.3 10e3/uL   MONO# 0.7  0.1 - 0.9 10e3/uL   Eosinophils Absolute 0.1  0.0 - 0.5 10e3/uL   Basophils Absolute 0.0  0.0 - 0.1 10e3/uL   NEUT% 63.6  38.4 - 76.8 %   LYMPH% 23.4  14.0 - 49.7 %   MONO% 11.5  0.0 - 14.0 %   EOS% 0.8  0.0 - 7.0 %   BASO% 0.7  0.0 - 2.0 %  COMPREHENSIVE METABOLIC PANEL (0000000)     Status: Abnormal   Collection Time    12/11/13 11:06 AM      Result Value  Ref Range   Sodium 142  136 - 145 mEq/L   Potassium 4.0  3.5 - 5.1 mEq/L   Chloride 106  98 - 109 mEq/L   CO2 25  22 - 29 mEq/L   Glucose 109  70 - 140 mg/dl   BUN 14.2  7.0 - 26.0 mg/dL   Creatinine 1.0  0.6 - 1.1 mg/dL   Total Bilirubin 0.40  0.20 - 1.20 mg/dL   Alkaline Phosphatase 60  40 - 150 U/L   AST 22  5 - 34 U/L   ALT 15  0 - 55 U/L   Total Protein 7.5  6.4 - 8.3 g/dL   Albumin 4.1  3.5 - 5.0 g/dL   Calcium 9.8  8.4 - 10.4 mg/dL   Anion Gap 12 (*) 3 - 11 mEq/L         RADIOGRAPHY: DIGITAL SCREENING BILATERAL MAMMOGRAM WITH CAD 02-15-2013 Comparison: Previous exam(s).  FINDINGS:  ACR Breast Density Category c: The breast  tissue is  heterogeneously dense, which may obscure small masses.  There are bilateral calcifications.  Images were processed with CAD.  IMPRESSION:  Further imaging evaluation is suggested for bilateral  calcifications.  RECOMMENDATION:  Diagnostic mammogramof both breasts.     DIGITAL DIAGNOSTIC BILATERAL MAMMOGRAM   03-05-2013 Comparison: 02/15/2013, 01/20/2012, dating back to 08/19/2005.  Findings:  ACR Breast Density Category c: The breast tissue is  heterogeneously dense, which may obscure small masses.  Spot magnification CC and ML views of the calcifications in the  upper right breast, middle third, demonstrate a 2 mm group of  punctate calcifications. There are no associated suspicious linear  or branching forms.  Spot magnification CC and ML views of the calcifications in the  upper outer left breast, posterior third, demonstrates a 3 mm group  of faint punctate calcifications, some of which may layer on the ML  view. There are no associated suspicious linear or branching  forms.  IMPRESSION:  Likely benign calcifications in both breasts.  RECOMMENDATION:  Bilateral diagnostic mammography with spot magnification views in 6  months.    09-06-2013 + addendum 10-29-13 DIGITAL BREAST TOMOSYNTHESIS  Digital breast tomosynthesis  images are acquired in two projections.  These images are reviewed in combination with the digital mammogram,  confirming the findings below.  Electronically Signed  By: Everlean Alstrom M.D.  On: 10/29/2013 15:46       Study Result    CLINICAL DATA: Six-month follow-up of probably benign bilateral  breast calcifications.  EXAM:  DIGITAL DIAGNOSTIC BILATERAL MAMMOGRAM WITH CAD  COMPARISON: Previous exams.  ACR Breast Density Category c: The breast tissue is heterogeneously  dense, which may obscure small masses.  FINDINGS:  There is a stable group of round and punctate calcifications in the  superior right breast spanning a distance of 2 mm and confirmed on  the additional spot compression magnification CC and mL views.  There is a group of punctate calcifications in the upper-outer left  breast spanning a distance of 6 mm confirmed on the spot compression  magnification CC and mL views. These appear slightly increased in  number when compared to the prior exam, however this is felt to be a  result of motion obscuring the calcifications on the prior exam  rather than a true increase in number of calcifications.  Mammographic images were processed with CAD.  IMPRESSION:  1. Stable probably benign right breast calcifications.  2. Probably benign left breast calcifications, which appear slightly  increased in number when compared to the prior exam, however this is  felt to be a result of motion obscuring the calcifications on the  prior exam rather than a true increase in number of the  calcifications.  RECOMMENDATION:  1. Six-month followup of the probably benign right breast  calcifications which will demonstrate a total of 1 year of  stability.  2. An additional six-month follow-up of the probably benign left  breast calcifications was offered to the patient, however she  requests stereotactic biopsy. This is scheduled for February 26 at 8  a.m.  I have discussed the  findings and recommendations with the patient.  Results were also provided in writing at the conclusion of the  visit. If applicable, a reminder letter will be sent to the patient  regarding the next appointment.  BI-RADS CATEGORY 3: Probably benign finding(s) - short interval  follow-up suggested.  Electronically Signed:  By: Everlean Alstrom M.D.  On: 09/06/2013 08:54     ADDENDUM REPORT: 09/24/2013  13:45  ADDENDUM  Histologic evaluation demonstrates the biopsied calcifications to be  due to fibrocystic change with calcifications. The pathologist  states that, in the background of the fibrocystic change with  calcifications, there is lobular neoplasia (atypical lobular  hyperplasia). I discussed the findings with the patient. We  discussed the possibility of surveillance versus surgical excision.  The patient requests surgical consultation for consideration of  excision. Surgical consultation has been scheduled with Dr.  Donne Hazel for 10/07/2013 at 14:30 hrs. The patient reports some  tenderness at the site of the biopsy but no other complications.  SIGNATURE  Electronically Signed  By: Ulyess Blossom M.D.  On: 09/24/2013 13:45       Study Result    CLINICAL DATA  Microcalcifications in the left breast.  EXAM  LEFT STEREOTACTIC CORE NEEDLE BIOPSY   09-23-2013 COMPARISON  Previous exams.  FINDINGS  The patient and I discussed the procedure of stereotactic-guided  biopsy including benefits and alternatives. We discussed the high  likelihood of a successful procedure. We discussed the risks of the  procedure including infection, bleeding, tissue injury, clip  migration, and inadequate sampling. Informed written consent was  given. The usual time out protocol was performed immediately prior  to the procedure.  Recent mammograms are reviewed. In trying to target the  calcifications that were originally circled as being located in the  left upper outer quadrant, it is  realized that there are multiple  groups of calcifications that overlap on the ML view. Therefore, the  most easily visualized group in the left upper inner quadrant are  targeted for biopsy today.  Using sterile technique, 2% Lidocaine and 2% lidocaine with  epinephrine as local anesthetic, and stereotactic guidance, a 9  gauge vacuum assisted device was used to perform core needle biopsy  of calcifications in the left upper inner quadrant using a  craniocaudal approach. Specimen radiograph was performed showing  microcalcifications within a specimen. Specimens with calcifications  are identified for pathology.  At the conclusion of the procedure, a X shaped tissue marker clip  was deployed into the biopsy cavity. Follow-up 2-view mammogram  confirmed clip placement and removal of the calcifications.  IMPRESSION  Stereotactic-guided biopsy of calcifications in the left upper inner  quadrant. No apparent complications.             PATHOLOGY Lumley, Violette A Collected: 09/23/2013 Client: [facility] Accession: [id number] Received: 09/23/2013 Ulyess Blossom, MDGICAL PATHOLOGY FINAL DIAGNOSIS Diagnosis Breast, left, needle core biopsy, ~11 o'clock, 7 cm / nipple - LOBULAR NEOPLASIA (ATYPICAL LOBULAR HYPERPLASIA). - FIBROCYSTIC CHANGES WITH CALCIFICATIONS.   Schmieder, Sumie A Collected: 10/21/2013 Client: [facility] Accession: [id number] Received: 10/21/2013 Lenard Lance FINAL DIAGNOSIS Diagnosis Breast, lumpectomy, Left - ATYPICAL LOBULAR HYPERPLASIA (LOBULAR NEOPLASIA). - MARGINS NOT INVOLVED. - FIBROCYSTIC CHANGES WITH CALCIFICATIONS. - BIOPSY SITE REACTION. Microscopic Comment In the breast tissue adjacent to the biopsy site, there is focal atypical lobular hyperplasia (lobular neoplasia) which does not involve the margins of the specimen. There are also fibrocystic changes which calcifications. (JDP:gt, 10/22/13) Claudette Laws  MD Pathologist, Electronic Signature (Case signed 10/22/2013) Specimen Gross and Clinical Information Specimen(s) Obtained: Breast, lumpectomy, Left Specimen Clinical Information Left breast mass; Lobular hyperplasia left breast (j[facility]) Gross Specimen type: Left breast lumpectomy, in formalin at 0856 hours. Size: 5.2 x 4 cm, and ranges from 1 to 2.2 cm thick. Orientation: The specimen is previously inked as follows: green anterior, blue inferior, orange lateral, yellow medial, black  posterior, red superior. Localized area: There are two inserted pins, one green and one yellow. The green pin localizes a radioactive seed implant and the yellow localizes a clip. Cut surface: Found in the area of the green pin is a radioactive seed implant, which is sent to nuclear medicine. On sectioning through the area with the yellow pin, there is a 0.6 cm in diameter cavity containing red brown soft material and a clip. Surrounding the cavity is tan white to yellow red indurated tissue, 1.6 x 1.4 x 1 cm.    DISCUSSION: we have discussed all of history above, evaluation and intervention for the breast disease, and pathology information as above. Her Baker Janus risk calculates to 3.1% in five years, vs average 1.2%. She understands that this is considered a high risk finding, but is not cancer and not dangerous in itself. The surgical excision appears very adequate. We have discussed lifestyle interventions including stopping exogenous hormones as she has already done, weight loss to ideal, regular exercise, good diet high in vegetables and fruits. We have discussed systemic hormone blocker, which can give some benefit in some individuals against development of  invasive breast cancer ( this improvement generally in ER +), tho with some possible side effects including exacerbation of hot flashes, blood clots, development of uterine cancer. She is interested in trying tamoxifen, which is the option available for  premenopausal patients. She understands that she can stop this at any time if not tolerated, in which case we will continue to follow with close observation. She would like to continue follow up also with Dr Donne Hazel, and I will request this. She is already followed by gynecology, this note to be copied to Dr Ouida Sills. If she tolerates the tamoxifen, this will be used for five years.  She and twin brother plan a vacation to [place] for their 50th birthdays, so she may adjust time that she starts tamoxifen around this, or may stop the medication during the vacation if hot flashes are more bothersome. I will see her back ~ 4-6 weeks after starting the medication.     IMPRESSION / PLAN:  1.atypical lobular hyperplasia left breast diagnosed 09-23-13 and post lumpectomy with clear margins 10-21-13: trial of tamoxifen. 2.calcifications in right breast apparently stable and thought benign: follow up mammograms planned in 6 months 3.chronic insomnia 4.elevated cholesterol 5.Raynaud's, improved with procardia 6.post surgery for plantar fasciitis 7.right hip bursitis 8.post uterine fibroidectomy at time of C section 15 years ago    Patient has had questions answered to her satisfaction and is in agreement with plan above. She can contact this office for questions or concerns at any time prior to next scheduled visit.  Time spent  45 min, including >50% discussion and coordination of care.  No chemotherapy plan, no antiemetics needed.  Gordy Levan, MD 12/11/2013 2:09 PM

## 2013-12-12 ENCOUNTER — Telehealth: Payer: Self-pay | Admitting: *Deleted

## 2013-12-12 ENCOUNTER — Other Ambulatory Visit: Payer: Self-pay | Admitting: Oncology

## 2013-12-12 DIAGNOSIS — N6099 Unspecified benign mammary dysplasia of unspecified breast: Secondary | ICD-10-CM

## 2013-12-12 NOTE — Telephone Encounter (Signed)
Pt left a message, states she discussed genetic testing with Dr Marko Plume and would like to proceed with testing

## 2013-12-13 ENCOUNTER — Telehealth: Payer: Self-pay | Admitting: Oncology

## 2013-12-13 NOTE — Telephone Encounter (Signed)
lvm for pt regarding to June and July appt....mailed pt appt sched/avs and letter

## 2013-12-16 ENCOUNTER — Encounter: Payer: 59 | Admitting: Internal Medicine

## 2013-12-23 ENCOUNTER — Telehealth: Payer: Self-pay

## 2013-12-23 DIAGNOSIS — C50919 Malignant neoplasm of unspecified site of unspecified female breast: Secondary | ICD-10-CM

## 2013-12-23 DIAGNOSIS — R110 Nausea: Secondary | ICD-10-CM

## 2013-12-23 MED ORDER — ONDANSETRON HCL 4 MG PO TABS: 4.0000 mg | ORAL_TABLET | Freq: Two times a day (BID) | ORAL | Status: DC | PRN

## 2013-12-23 NOTE — Telephone Encounter (Signed)
Told Ms. Turton that a prescription for Zofran 4 mg tabs 1-2 tabs q 12 hours was sent to [facility].

## 2013-12-23 NOTE — Telephone Encounter (Signed)
Katherine [name] called stating that she has been experiencing nausea since beginning the tamoxifen.  Could she have a prescription for zofran sent to [facility]?

## 2013-12-24 ENCOUNTER — Encounter: Payer: Self-pay | Admitting: Internal Medicine

## 2013-12-24 ENCOUNTER — Ambulatory Visit (INDEPENDENT_AMBULATORY_CARE_PROVIDER_SITE_OTHER): Payer: 59 | Admitting: Internal Medicine

## 2013-12-24 ENCOUNTER — Other Ambulatory Visit (INDEPENDENT_AMBULATORY_CARE_PROVIDER_SITE_OTHER): Payer: 59

## 2013-12-24 VITALS — BP 128/84 | HR 70 | Temp 98.5°F | Ht 65.5 in | Wt 134.0 lb

## 2013-12-24 DIAGNOSIS — Z0000 Encounter for general adult medical examination without abnormal findings: Secondary | ICD-10-CM

## 2013-12-24 DIAGNOSIS — G4700 Insomnia, unspecified: Secondary | ICD-10-CM

## 2013-12-24 DIAGNOSIS — R7309 Other abnormal glucose: Secondary | ICD-10-CM

## 2013-12-24 LAB — LIPID PANEL
CHOLESTEROL: 185 mg/dL (ref 0–200)
HDL: 80.2 mg/dL (ref >39.00)
LDL CALC: 96 mg/dL (ref 0–99)
NonHDL: 104.8
TRIGLYCERIDES: 45 mg/dL (ref 0.0–149.0)
Total CHOL/HDL Ratio: 2
VLDL: 9 mg/dL (ref 0.0–40.0)

## 2013-12-24 LAB — HEMOGLOBIN A1C: Hgb A1c MFr Bld: 5.3 % (ref 4.6–6.5)

## 2013-12-24 LAB — TSH: TSH: 0.35 u[IU]/mL (ref 0.35–4.50)

## 2013-12-24 NOTE — Progress Notes (Signed)
   Subjective:    Patient ID: Katherine [name], female    DOB: Jan 03, 1966, 48 y.o.   MRN: [id number]  HPI  She is here for a physical;acute issues include  chronic sleep issues.    Review of Systems  She has been evaluated by Dr. Clent Demark for her chronic sleep issues. She has insomnia but also a component of sleep apnea. CPAP was not recommended but an oral appliance was.  She has been taking Sonata with some benefit. She has not had the oral appliance made as it was prohibitively expensive .  She did have desaturation to 87%. There is a history of dysrhythmia in her maternal grandmother. It is not known whether she had any sleep issues.  She is on tamoxifen; Dr Marko Plume questioned need for vit D assessment.   Recent random glucoses have been mildly elevated.    Objective:   Physical Exam Gen.: Thin but healthy and well-nourished in appearance. Alert, appropriate and cooperative throughout exam. Appears younger than stated age  Head: Normocephalic without obvious abnormalities Eyes: No corneal or conjunctival inflammation noted. Pupils equal round reactive to light and accommodation. Extraocular motion intact.  Ears: External  ear exam reveals no significant lesions or deformities. Canals clear .TMs normal. Hearing is grossly normal bilaterally. Nose: External nasal exam reveals no deformity or inflammation. Nasal mucosa are pink and moist. No lesions or exudates noted.   Mouth: Oral mucosa and oropharynx reveal no lesions or exudates. Teeth in good repair. Neck: No deformities, masses, or tenderness noted. Range of motion & Thyroid normal. Lungs: Normal respiratory effort; chest expands symmetrically. Lungs are clear to auscultation without rales, wheezes, or increased work of breathing. Heart: Normal rate and rhythm. Normal S1 and S2. No gallop, click, or rub. No  murmur. Abdomen: Bowel sounds normal; abdomen soft and nontender. No masses, organomegaly or hernias noted. Genitalia: as per Gyn                                   Musculoskeletal/extremities: No deformity or scoliosis noted of  the thoracic or lumbar spine. . No clubbing, cyanosis, edema, or significant extremity  deformity noted. Range of motion normal .Tone & strength normal. Hand joints normal.  Fingernail / toenail health good. Able to lie down & sit up w/o help. Negative SLR bilaterally Vascular: Carotid, radial artery, dorsalis pedis and  posterior tibial pulses are  Equal.Decreased DPP.No bruits present. Neurologic: Alert and oriented x3. Deep tendon reflexes symmetrical and normal.  Gait normal .      Skin: Intact without suspicious lesions or rashes. Lymph: No cervical, axillary lymphadenopathy present. Psych: Mood and affect are normal. Normally interactive                                                                                        Assessment & Plan:  #1 comprehensive physical exam; no acute findings #2 sleep apnea; oral appliance recommended if Sonata continued Plan: see Orders  & Recommendations

## 2013-12-24 NOTE — Progress Notes (Signed)
Pre visit review using our clinic review tool, if applicable. No additional management support is needed unless otherwise documented below in the visit note. 

## 2013-12-24 NOTE — Patient Instructions (Signed)
Your next office appointment will be determined based upon review of your pending labs. Those instructions will be transmitted to you through My Chart . 

## 2013-12-25 ENCOUNTER — Other Ambulatory Visit: Payer: Self-pay | Admitting: Internal Medicine

## 2013-12-25 DIAGNOSIS — R946 Abnormal results of thyroid function studies: Secondary | ICD-10-CM

## 2013-12-31 LAB — VITAMIN D 1,25 DIHYDROXY
Vitamin D 1, 25 (OH)2 Total: 33 pg/mL (ref 18–72)
Vitamin D3 1, 25 (OH)2: 33 pg/mL

## 2014-01-01 ENCOUNTER — Other Ambulatory Visit: Payer: Self-pay | Admitting: Internal Medicine

## 2014-01-01 DIAGNOSIS — E559 Vitamin D deficiency, unspecified: Secondary | ICD-10-CM

## 2014-01-10 ENCOUNTER — Other Ambulatory Visit: Payer: Self-pay | Admitting: *Deleted

## 2014-01-10 DIAGNOSIS — N6099 Unspecified benign mammary dysplasia of unspecified breast: Secondary | ICD-10-CM

## 2014-01-11 ENCOUNTER — Other Ambulatory Visit: Payer: Self-pay | Admitting: Oncology

## 2014-01-13 ENCOUNTER — Ambulatory Visit (HOSPITAL_BASED_OUTPATIENT_CLINIC_OR_DEPARTMENT_OTHER): Payer: 59

## 2014-01-13 ENCOUNTER — Encounter: Payer: Self-pay | Admitting: Genetic Counselor

## 2014-01-13 ENCOUNTER — Ambulatory Visit (HOSPITAL_BASED_OUTPATIENT_CLINIC_OR_DEPARTMENT_OTHER): Payer: 59 | Admitting: Genetic Counselor

## 2014-01-13 ENCOUNTER — Encounter: Payer: Self-pay | Admitting: Specialist

## 2014-01-13 DIAGNOSIS — G4700 Insomnia, unspecified: Secondary | ICD-10-CM

## 2014-01-13 DIAGNOSIS — N62 Hypertrophy of breast: Secondary | ICD-10-CM

## 2014-01-13 DIAGNOSIS — N6099 Unspecified benign mammary dysplasia of unspecified breast: Secondary | ICD-10-CM

## 2014-01-13 DIAGNOSIS — N6092 Unspecified benign mammary dysplasia of left breast: Secondary | ICD-10-CM | POA: Insufficient documentation

## 2014-01-13 DIAGNOSIS — IMO0002 Reserved for concepts with insufficient information to code with codable children: Secondary | ICD-10-CM

## 2014-01-13 LAB — CBC WITH DIFFERENTIAL/PLATELET
BASO%: 0.6 % (ref 0.0–2.0)
Basophils Absolute: 0 10*3/uL (ref 0.0–0.1)
EOS ABS: 0 10*3/uL (ref 0.0–0.5)
EOS%: 0.7 % (ref 0.0–7.0)
HCT: 40.8 % (ref 34.8–46.6)
HGB: 13.5 g/dL (ref 11.6–15.9)
LYMPH%: 17.7 % (ref 14.0–49.7)
MCH: 30.7 pg (ref 25.1–34.0)
MCHC: 33.1 g/dL (ref 31.5–36.0)
MCV: 92.6 fL (ref 79.5–101.0)
MONO#: 0.6 10*3/uL (ref 0.1–0.9)
MONO%: 10.2 % (ref 0.0–14.0)
NEUT#: 3.9 10*3/uL (ref 1.5–6.5)
NEUT%: 70.8 % (ref 38.4–76.8)
Platelets: 204 10*3/uL (ref 145–400)
RBC: 4.41 10*6/uL (ref 3.70–5.45)
RDW: 12.6 % (ref 11.2–14.5)
WBC: 5.6 10*3/uL (ref 3.9–10.3)
lymph#: 1 10*3/uL (ref 0.9–3.3)

## 2014-01-13 LAB — COMPREHENSIVE METABOLIC PANEL (CC13)
ALBUMIN: 4.1 g/dL (ref 3.5–5.0)
ALK PHOS: 55 U/L (ref 40–150)
ALT: 13 U/L (ref 0–55)
AST: 22 U/L (ref 5–34)
Anion Gap: 9 mEq/L (ref 3–11)
BUN: 12.8 mg/dL (ref 7.0–26.0)
CO2: 28 mEq/L (ref 22–29)
Calcium: 9.8 mg/dL (ref 8.4–10.4)
Chloride: 107 mEq/L (ref 98–109)
Creatinine: 1 mg/dL (ref 0.6–1.1)
GLUCOSE: 94 mg/dL (ref 70–140)
POTASSIUM: 4.2 meq/L (ref 3.5–5.1)
SODIUM: 144 meq/L (ref 136–145)
TOTAL PROTEIN: 7.6 g/dL (ref 6.4–8.3)
Total Bilirubin: 0.43 mg/dL (ref 0.20–1.20)

## 2014-01-13 NOTE — Progress Notes (Signed)
Met patient outside hospital (in coaching session) and in lobby this morning. 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 Loyal expressed concern about the lack of control she feels in her life right now and her struggle with the uncertainty of the future. She was particularly concerned with how to tell her family about her diagnosis. I helped her understand and clarify her decision making about what response would work for her. Encouraged a focus on the present and normalized her struggle with uncertainty. Support.  Epifania Gore, PhD, [place]

## 2014-01-13 NOTE — Progress Notes (Signed)
Patient Name: Katherine [name] Patient Age: 48 y.o. Encounter Date: 01/13/2014  Referring Physician: Zane Herald, MD  Primary Care Provider: Unice Cobble, MD   Katherine [name], a 48 y.o. female, is being seen at the [facility] due to a personal history of atypical lobular hyperplasia.  She presents to clinic today to discuss the possibility of a hereditary predisposition to cancer and discuss whether genetic testing is warranted.  HISTORY OF PRESENT ILLNESS: Katherine [name] has no personal history of cancer. In 09/2013, she was found to have left breast atypical lobular hyperplasia which was surgically excised. She reports having a mammogram every 6 months and a gynecological exam yearly.   Past Medical History  Diagnosis Date  . Raynaud's syndrome   . Pure hypercholesterolemia   . Plantar fasciitis   . Insomnia   . Insomnia, persistent 08/28/2013    Menopausal induced insomnia, early morning awakenings, and snoring.   . 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 PONV (postoperative nausea and vomiting)   . Heart murmur     ECHO 2012-trivial regurg-all else normal  . Atypical lobular hyperplasia of left breast     Past Surgical History  Procedure Laterality Date  . Cesarean section  1999  . Tonsillectomy and adenoidectomy  1981  . Uterine fibroidectomy      @ c section  . Plantar fasciitis surgery  2011, 2007    both feet  . Breast surgery  11/20/13    left breast; Dr Donne Hazel    History   Social History  . Marital Status: Married    Spouse Name: [name]    Number of Children: 1  . Years of Education: BSN   Occupational History  . Palliative Care Nurse  [employer]   Social History Main Topics  . Smoking status: Former Smoker    Quit date: 10/16/1991  . Smokeless tobacco: Never Used     Comment: smoked 1981-1993, up to 1 ppd  . Alcohol Use: Yes     Comment: occasionally   . Drug Use: No  . Sexual Activity: Not on  file   Other Topics Concern  . Not on file   Social History Narrative   [name]).   Patient has one child.   Patient is employed at [employer].   Patient has a Copywriter, advertising.   Patient drinks two cups of coffee every morning.   Regular exercise- Tae Kwon Do              FAMILY HISTORY:   Ms. Mastro was adopted. She has a son who is 15yo. She she has a twin brother who has a daughter and 2 sons. She also has a maternal half-brother who is 17. Her biological mother is 27 and cancer-free. She had a brother who died of heart issues at 56 and a sister who is in her 36s and cancer-free. The biological grandmother died at 37 with a history of cervical cancer and the biological grandfather died at 38.  There is no information about Ms. Cinelli's biological father.  Ms. Deshmukh ancestry is Caucasian - NOS. There is no known Jewish ancestry and no consanguinity as far as she is aware.  ASSESSMENT AND PLAN: Katherine [name] is a 48 y.o. female with a personal history of atypical lobular hyperplasia. Her family history is limited because she was adopted. She understand that the finding of [place] does not put her  at increased risk of harboring a mutation. We reviewed the characteristics, features and inheritance patterns of hereditary cancer syndromes. We also discussed genetic testing, the process of testing, insurance coverage and implications of results. A negative result will be reassuring.  Ms. Gallus wished to pursue genetic testing and a blood sample will be sent to [facility] for analysis of the BRCA1 and BRCA2 genes. We discussed the implications of a positive, negative and/ or Variant of Uncertain Significance (VUS) result. Results should be available in approximately 3 weeks, at which point we will contact her and address implications for her as well as address genetic testing for at-risk family members, if needed.    We encouraged Ms. Botkin to remain in contact with Cancer Genetics  annually so that we can update the family history and inform her of any changes in cancer genetics and testing that may be of benefit for this family. Ms.  Tayag questions were answered to her satisfaction today.   Thank you for the referral and allowing [place] to share in the care of your patient.   The patient was seen for a total of 30 minutes, greater than 50% of which was spent face-to-face counseling. This patient was discussed with the overseeing provider who agrees with the above.

## 2014-01-15 ENCOUNTER — Encounter: Payer: Self-pay | Admitting: Oncology

## 2014-01-15 ENCOUNTER — Ambulatory Visit (HOSPITAL_BASED_OUTPATIENT_CLINIC_OR_DEPARTMENT_OTHER): Payer: 59 | Admitting: Oncology

## 2014-01-15 ENCOUNTER — Telehealth: Payer: Self-pay | Admitting: Oncology

## 2014-01-15 VITALS — BP 138/83 | HR 66 | Temp 98.3°F | Resp 18 | Ht 65.0 in | Wt 132.5 lb

## 2014-01-15 DIAGNOSIS — R110 Nausea: Secondary | ICD-10-CM

## 2014-01-15 DIAGNOSIS — C50919 Malignant neoplasm of unspecified site of unspecified female breast: Secondary | ICD-10-CM

## 2014-01-15 DIAGNOSIS — Z79810 Long term (current) use of selective estrogen receptor modulators (SERMs): Secondary | ICD-10-CM

## 2014-01-15 MED ORDER — ONDANSETRON HCL 4 MG PO TABS: 4.0000 mg | ORAL_TABLET | Freq: Two times a day (BID) | ORAL | Status: DC | PRN

## 2014-01-15 NOTE — Progress Notes (Signed)
OFFICE PROGRESS NOTE   01/15/2014   Physicians:Matthew Holli Humbles; C.Dohmeier, Freda Munro   INTERVAL HISTORY:  Patient is seen, alone for visit, in continuing attention to atypical lobular hyperplasia of left breast, for which she is post lumpectomy 10-21-13 and began prophylactic tamoxifen on 12-15-13. She had some nausea initially with the tamoxifen, improved with prn zofran and seems to be resolving. She has had more hot flashes, mostly at night, which are not helping her chronic sleep problems.   She met with genetics counselors on 01-13-14, testing sent and pending.  She saw Dr Linna Darner on 12-24-13, TSH and Vit D ordered.  She plans trip to [place] with her twin brother and families upcoming, to celebrate their 66th birthdays. She may hold tamoxifen during the trip, so as not to have the increased hot flashes.   ONCOLOGIC HISTORY Patient had bilateral screening mammograms at [facility] 02-15-2013 with bilateral calcifications, followed up with bilateral diagnostic mammograms at [facility] 03-05-2013. Breast tissue is heterogeneously dense. She had 6 month follow up on 09-06-2013 (that report actually dated 10-29-13 in EMR) with slight increase in left breast calcifications and stable right breast calcifications thought likely benign; patient requested biopsy of the left breast rather than mammographic follow up. Left stereotactic needle biopsy 09-23-2013 (~11:00 , 7cm from nipple) found atypical lobular hyperplasia ([id number]). She was referred to Dr Donne Hazel, with left radioactive seed guided excisional lumpectomy on 10-21-2013. Pathology [id number]) found focal atypical lobular hyperplasia adjacent to the biopsy site, not involving margins. Specimen size was 5.2 x 4 x 1 to 2.2 cm and specimen radiograph confirmed seed and biopsy clip placement. She had local hematoma postoperatively, which is progressively improving. She did not have MRI. She is to have 6 month follow up  mammograms, including right.  Review of systems as above, also: No changes in breasts. Sleeps ~ 3 hrs after Sonata 10 mg, which she has been on for an extended time. Drinking fluids. Remainder of 10 point Review of Systems negative.  Objective:  Vital signs in last 24 hours:  BP 138/83  Pulse 66  Temp(Src) 98.3 F (36.8 C) (Oral)  Resp 18  Ht 5' 5"  (1.651 m)  Wt 132 lb 8 oz (60.102 kg)  BMI 22.05 kg/m2 Weight down 1 lb. Alert, oriented and appropriate. Ambulatory without difficulty.Respirations not labored RA. LE no edema, cords, tenderness. No physical exam repeated otherwise today.  Lab Results:  Results for orders placed in visit on 01/13/14  CBC WITH DIFFERENTIAL      Result Value Ref Range   WBC 5.6  3.9 - 10.3 10e3/uL   NEUT# 3.9  1.5 - 6.5 10e3/uL   HGB 13.5  11.6 - 15.9 g/dL   HCT 40.8  34.8 - 46.6 %   Platelets 204  145 - 400 10e3/uL   MCV 92.6  79.5 - 101.0 fL   MCH 30.7  25.1 - 34.0 pg   MCHC 33.1  31.5 - 36.0 g/dL   RBC 4.41  3.70 - 5.45 10e6/uL   RDW 12.6  11.2 - 14.5 %   lymph# 1.0  0.9 - 3.3 10e3/uL   MONO# 0.6  0.1 - 0.9 10e3/uL   Eosinophils Absolute 0.0  0.0 - 0.5 10e3/uL   Basophils Absolute 0.0  0.0 - 0.1 10e3/uL   NEUT% 70.8  38.4 - 76.8 %   LYMPH% 17.7  14.0 - 49.7 %   MONO% 10.2  0.0 - 14.0 %   EOS% 0.7  0.0 - 7.0 %  BASO% 0.6  0.0 - 2.0 %  COMPREHENSIVE METABOLIC PANEL (OM35)      Result Value Ref Range   Sodium 144  136 - 145 mEq/L   Potassium 4.2  3.5 - 5.1 mEq/L   Chloride 107  98 - 109 mEq/L   CO2 28  22 - 29 mEq/L   Glucose 94  70 - 140 mg/dl   BUN 12.8  7.0 - 26.0 mg/dL   Creatinine 1.0  0.6 - 1.1 mg/dL   Total Bilirubin 0.43  0.20 - 1.20 mg/dL   Alkaline Phosphatase 55  40 - 150 U/L   AST 22  5 - 34 U/L   ALT 13  0 - 55 U/L   Total Protein 7.6  6.4 - 8.3 g/dL   Albumin 4.1  3.5 - 5.0 g/dL   Calcium 9.8  8.4 - 10.4 mg/dL   Anion Gap 9  3 - 11 mEq/L     Studies/Results:  No results found.  Medications: I have reviewed  the patient's current medications. Suggested Vitamin E 400 mg AM and 800 mg HS for hot flashes. Mentioned clonidine for hot flashes, would consider patch if tried.  DISCUSSION: slight nausea with initiation of tamoxifen generally resolved spontaneously after short time. Instructed to push po fluids if hot flashes, discussed meds and brief break off tamoxifen for vacation as above.  Assessment/Plan:  1.atypical lobular hyperplasia left breast diagnosed 09-23-13 and post lumpectomy with clear margins 10-21-13: trial of tamoxifen.  2.calcifications in right breast apparently stable and thought benign: follow up mammograms planned in 6 months  3.chronic insomnia  4.elevated cholesterol  5.Raynaud's, improved with procardia  6.post surgery for plantar fasciitis  7.right hip bursitis  8.post uterine fibroidectomy at time of C section 15 years ago  TIme spent 25 min including >50% counseling and coordination of care. All questions answered to her satisfaction. I will see her back in 6 mo or sooner if needed.    LIVESAY,LENNIS P, MD   01/15/2014, 11:17 AM

## 2014-01-15 NOTE — Telephone Encounter (Signed)
per pof to sch pt sch-gave pt copy of sch

## 2014-01-15 NOTE — Patient Instructions (Signed)
Vitamin E 400 mg: one twice daily or one AM and two PM as this may help hot flashes

## 2014-01-29 ENCOUNTER — Encounter: Payer: Self-pay | Admitting: Genetic Counselor

## 2014-01-29 NOTE — Progress Notes (Signed)
Referring Physician: Evlyn Clines, MD   [name] was called today to discuss genetic test results. Please see the Genetics note from her visit on 01/13/14 for a detailed discussion of her personal and family history.  GENETIC TESTING: At the time of [name]'s visit, we recommended she pursue genetic testing of the BRCA1 and BRCA2 genes. This test, which included sequencing and deletion/duplication analysis, was performed at [facility]. Testing was normal and did not reveal a mutation in either of these genes. These results are reassuring.  We discussed with [name] that since the current test is not perfect, it is possible there may be a gene mutation that current testing cannot detect, but that chance is small. We also discussed that it is possible that a different genetic factor, which was not part of this testing or has not yet been discovered, is responsible. Again, this is not very likely.  CANCER SCREENING: This normal result is reassuring and indicates that [name] does not likely have an increased risk of cancer due to a mutation in one of these genes. We recommended [name] continue to follow the cancer screening guidelines provided by her overseeing providers based on her history of [employer].  Lastly, we discussed with [name] that cancer genetics is a rapidly advancing field and it is possible that new genetic tests will be appropriate for her in the future. We encouraged her to remain in contact with [place] on an annual basis so we can update her personal and family histories, and let her know of advances in cancer genetics that may benefit the family. Our contact number was provided. [name] questions were answered to her satisfaction today, and she knows she is welcome to call anytime with additional questions.    Steele Berg, MS, [place] Certified Genetic Counseor phone: [phone] [email or web address]

## 2014-02-11 ENCOUNTER — Telehealth: Payer: Self-pay | Admitting: Neurology

## 2014-02-11 NOTE — Telephone Encounter (Signed)
Patient calling to state that her insurance says they will not cover the dental plate for sleep apnea that was discussed during last appointment, states it will cost $3,000, so she didn't have it done. Patient is wondering if there are any alternatives. Please return call and advise.

## 2014-02-11 NOTE — Telephone Encounter (Signed)
Patient was called and given the two names of Dentists that will be able to help her with the dental appliance insurance claim, Dr. Oneal Grout and Dr. Augustina Mood.

## 2014-02-11 NOTE — Telephone Encounter (Signed)
Patient returning call to [facility], please return call and advise.

## 2014-02-11 NOTE — Telephone Encounter (Signed)
Left a message for the patient to call the office to discuss.   DS, CMA 07/28

## 2014-02-11 NOTE — Telephone Encounter (Signed)
Left another message for the patient to return the call to discuss the other dentists for the dental appliance.

## 2014-02-19 ENCOUNTER — Other Ambulatory Visit: Payer: Self-pay | Admitting: Internal Medicine

## 2014-02-20 NOTE — Telephone Encounter (Signed)
The referral notes were faxed to Dr. Toy Cookey and Dr. Ron Parker office.  The patient was notified and advised to call if she has any other questions or concerns.

## 2014-02-20 NOTE — Telephone Encounter (Signed)
Patient calling to state that she called these two dentists and they said they have not received a referral from [place] yet. Please advise.

## 2014-04-28 ENCOUNTER — Other Ambulatory Visit: Payer: Self-pay | Admitting: Oncology

## 2014-04-28 DIAGNOSIS — C50912 Malignant neoplasm of unspecified site of left female breast: Secondary | ICD-10-CM

## 2014-05-06 ENCOUNTER — Other Ambulatory Visit: Payer: Self-pay | Admitting: Oncology

## 2014-05-06 ENCOUNTER — Telehealth: Payer: Self-pay | Admitting: *Deleted

## 2014-05-06 ENCOUNTER — Other Ambulatory Visit: Payer: Self-pay | Admitting: Obstetrics and Gynecology

## 2014-05-06 MED ORDER — ZALEPLON 10 MG PO CAPS: 10.0000 mg | ORAL_CAPSULE | Freq: Every evening | ORAL | Status: DC | PRN

## 2014-05-06 NOTE — Telephone Encounter (Signed)
Message copied by Christa See on Tue May 06, 2014  2:56 PM ------      Message from: Gordy Levan      Created: Tue May 06, 2014 12:04 PM      Regarding: RE: 3D mammograms       I believe [facility] does 3D only on screening mammograms, not on diagnostic, and maybe they want to do diagnostic on left and  screening on right. The radiologists will get final say on this - tell her that I am ok with however they need to do it. If it is better to do bilateral diagnostic, that is ok with me too.             If PCP will do Sonata, that would be great; if not we can do it this time.            thanks            ----- Message -----         From: Christa See, RN         Sent: 05/06/2014  10:49 AM           To: Gordy Levan, MD      Subject: 3D mammograms                                            Pt called to clarify 3D mammogram order q 6 months. She understood that you wanted her to have 3D of both breasts. The breast center told her that they want her to only have 3D of only the right breast since she had surgical procedure on the left breast.             If you want 3D of both, an order must be placed in EPIC.             Also, she is requesting sonata 10mg  nightly (trouble sleeping due to hot flashes). States she has taken this before but has run out.            Let me know and I will call pt back. Thanks! Laporshia Hogen              ------

## 2014-05-06 NOTE — Telephone Encounter (Signed)
Pt left VM asking about mammogram orders and also to see if Dr. Marko Plume would refill her Read Drivers since she has trouble sleeping due to hot flashes. As noted below by Dr. Marko Plume the radiologist will get final say on what mammogram is ordered. Called the [facility] and spoke with Dr. Shelly Bombard who has read patient's previous mammograms. She recommends doing bilateral diagnostic mammogram and then going forward doing annual mammograms. Dr. Marko Plume notified of this and order placed for diagnostic bilateral. Pt called and notified that order is in [place].  Asked patient if primary care MD could order sonata and she said "Dr. Linna Darner doesn't like prescribing anything that makes you sleepy and he recommends I followup with the sleep center so I feel like I am getting passed around." Told pt that  Dr. Marko Plume will fill her Read Drivers this time and script called into pharmacy.

## 2014-05-09 ENCOUNTER — Other Ambulatory Visit: Payer: Self-pay | Admitting: Oncology

## 2014-05-09 DIAGNOSIS — R921 Mammographic calcification found on diagnostic imaging of breast: Secondary | ICD-10-CM

## 2014-05-28 ENCOUNTER — Ambulatory Visit
Admission: RE | Admit: 2014-05-28 | Discharge: 2014-05-28 | Disposition: A | Discharge status: Home / Self Care | Payer: 59 | Source: Ambulatory Visit | Attending: Oncology | Admitting: Oncology

## 2014-05-28 DIAGNOSIS — R921 Mammographic calcification found on diagnostic imaging of breast: Secondary | ICD-10-CM

## 2014-05-29 ENCOUNTER — Telehealth: Payer: Self-pay | Admitting: Oncology

## 2014-05-29 NOTE — Telephone Encounter (Signed)
due to schedule change moved 12/2 appts to 12/3. lmonvm for pt and mailed schedule.

## 2014-06-13 ENCOUNTER — Other Ambulatory Visit: Payer: Self-pay | Admitting: Oncology

## 2014-06-17 ENCOUNTER — Other Ambulatory Visit: Payer: Self-pay | Admitting: Obstetrics and Gynecology

## 2014-06-18 ENCOUNTER — Ambulatory Visit: Payer: 59 | Admitting: Oncology

## 2014-06-18 ENCOUNTER — Other Ambulatory Visit: Payer: 59

## 2014-06-19 ENCOUNTER — Encounter: Payer: Self-pay | Admitting: Oncology

## 2014-06-19 ENCOUNTER — Telehealth: Payer: Self-pay | Admitting: Oncology

## 2014-06-19 ENCOUNTER — Ambulatory Visit (HOSPITAL_BASED_OUTPATIENT_CLINIC_OR_DEPARTMENT_OTHER): Payer: 59 | Admitting: Oncology

## 2014-06-19 ENCOUNTER — Other Ambulatory Visit (HOSPITAL_BASED_OUTPATIENT_CLINIC_OR_DEPARTMENT_OTHER): Payer: 59

## 2014-06-19 VITALS — BP 143/70 | HR 60 | Temp 97.5°F | Resp 18 | Ht 65.0 in | Wt 139.2 lb

## 2014-06-19 DIAGNOSIS — G4700 Insomnia, unspecified: Secondary | ICD-10-CM

## 2014-06-19 DIAGNOSIS — R110 Nausea: Secondary | ICD-10-CM

## 2014-06-19 DIAGNOSIS — N6099 Unspecified benign mammary dysplasia of unspecified breast: Secondary | ICD-10-CM

## 2014-06-19 DIAGNOSIS — C50912 Malignant neoplasm of unspecified site of left female breast: Secondary | ICD-10-CM

## 2014-06-19 DIAGNOSIS — N62 Hypertrophy of breast: Secondary | ICD-10-CM

## 2014-06-19 DIAGNOSIS — C50919 Malignant neoplasm of unspecified site of unspecified female breast: Secondary | ICD-10-CM

## 2014-06-19 LAB — COMPREHENSIVE METABOLIC PANEL (CC13)
ALBUMIN: 4.2 g/dL (ref 3.5–5.0)
ALT: 17 U/L (ref 0–55)
ANION GAP: 11 meq/L (ref 3–11)
AST: 24 U/L (ref 5–34)
Alkaline Phosphatase: 63 U/L (ref 40–150)
BUN: 15.7 mg/dL (ref 7.0–26.0)
CO2: 26 meq/L (ref 22–29)
Calcium: 9.7 mg/dL (ref 8.4–10.4)
Chloride: 103 mEq/L (ref 98–109)
Creatinine: 1.1 mg/dL (ref 0.6–1.1)
EGFR: 60 mL/min/{1.73_m2} — AB (ref >90)
GLUCOSE: 93 mg/dL (ref 70–140)
POTASSIUM: 4.2 meq/L (ref 3.5–5.1)
Sodium: 140 mEq/L (ref 136–145)
Total Bilirubin: 0.53 mg/dL (ref 0.20–1.20)
Total Protein: 7.9 g/dL (ref 6.4–8.3)

## 2014-06-19 LAB — CYTOLOGY - PAP

## 2014-06-19 MED ORDER — TAMOXIFEN CITRATE 10 MG PO TABS: 20.0000 mg | ORAL_TABLET | Freq: Every day | ORAL | Status: DC

## 2014-06-19 MED ORDER — ONDANSETRON HCL 4 MG PO TABS: 4.0000 mg | ORAL_TABLET | Freq: Two times a day (BID) | ORAL | Status: DC | PRN

## 2014-06-19 NOTE — Progress Notes (Signed)
OFFICE PROGRESS NOTE   06/19/2014   Physicians: Aleen Sells; C.Dohmeier, Freda Munro  INTERVAL HISTORY:  Patient is seen, alone for visit, in scheduled follow up of atypical lobular hyperplasia of left breast, for which she is post lumpectomy 10-21-13 and has been on tamoxifen since 12-15-13 as prophylactic intervention. Hot flashes have increased since she has been on tamoxifen. She had 6 month follow up bilateral diagnostic tomo mammograms at [facility] 05-28-2014, extremely dense breast tissue with stable coarse calcifications on right and 6 month follow up diagnostic right mammogram recommended.  Patient is not aware of any changes in breasts bilaterally on self exam. Hot flashes are intermittent, day and night, at times some better and then days where they are more frequent.  Insomnia is chronic problem, seems somewhat worse with increased hot flashes since starting tamoxifen. She is to get a different bite block to use at night. Reports she slept ~ 4-5 hours last pm. Nausea is better than when she began tamoxifen, but still uses occasional zofran; she describes stomach discomfort when empty "if more than 4 hours without eating". She rarely uses OTC prilosec or equivalent.  Genetics testing was normal in 01-13-14.  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   ONCOLOGIC HISTORY Patient had bilateral screening mammograms at [facility] 02-15-2013 with bilateral calcifications, followed up with bilateral diagnostic mammograms at [facility] 03-05-2013. Breast tissue is heterogeneously dense. She had 6 month follow up on 09-06-2013 (that report actually dated 10-29-13 in EMR) with slight increase in left breast calcifications and stable right breast calcifications thought likely benign; patient requested biopsy of the left breast rather than mammographic follow up. Left stereotactic needle biopsy 09-23-2013 (~11:00 , 7cm  from nipple) found atypical lobular hyperplasia ([id number]). She was referred to Dr Donne Hazel, with left radioactive seed guided excisional lumpectomy on 10-21-2013. Pathology [id number]) found focal atypical lobular hyperplasia adjacent to the biopsy site, not involving margins. Specimen size was 5.2 x 4 x 1 to 2.2 cm and specimen radiograph confirmed seed and biopsy clip placement. She had local hematoma postoperatively, which is progressively improving. She did not have MRI.  6 month follow up diagnostic tomo mammograms bilateral 05-28-14 .  Review of systems as above, also: No recent infectious illness.Eating more due to fatigue with lack of sleep. No bleeding. Remainder of 10 point Review of Systems negative.  Objective:  Vital signs in last 24 hours:  BP 143/70 mmHg  Pulse 60  Temp(Src) 97.5 F (36.4 C) (Oral)  Resp 18  Ht 5\' 5"  (1.651 m)  Wt 139 lb 3.2 oz (63.141 kg)  BMI 23.16 kg/m2  SpO2 100%. Weight up 7 lbs  Alert, oriented and appropriate. Ambulatory without  difficulty.   HEENT:PERRL, sclerae not icteric. Oral mucosa moist without lesions, posterior pharynx clear.  Neck supple. No JVD.  Lymphatics:no cervical,supraclavicular, axillary adenopathy Resp: clear to auscultation bilaterally and normal percussion bilaterally Cardio: regular rate and rhythm. No gallop. GI: soft, nontender, not distended, no mass or organomegaly. Normally active bowel sounds.  Musculoskeletal/ Extremities: without pitting edema, cords, tenderness Neuro:  Nonfocal. PSYCH appropriate mood and affect Skin without rash, ecchymosis, petechiae Breasts: left lumpectomy not remarkable, otherwise bilaterally without dominant mass, skin or nipple findings. Axillae benign.  Lab Results:  Results for orders placed or performed in visit on 01/13/14  CBC with Differential  Result Value Ref Range   WBC 5.6 3.9 - 10.3 10e3/uL   NEUT#  3.9 1.5 - 6.5 10e3/uL   HGB 13.5 11.6 - 15.9 g/dL   HCT 40.8 34.8 -  46.6 %   Platelets 204 145 - 400 10e3/uL   MCV 92.6 79.5 - 101.0 fL   MCH 30.7 25.1 - 34.0 pg   MCHC 33.1 31.5 - 36.0 g/dL   RBC 4.41 3.70 - 5.45 10e6/uL   RDW 12.6 11.2 - 14.5 %   lymph# 1.0 0.9 - 3.3 10e3/uL   MONO# 0.6 0.1 - 0.9 10e3/uL   Eosinophils Absolute 0.0 0.0 - 0.5 10e3/uL   Basophils Absolute 0.0 0.0 - 0.1 10e3/uL   NEUT% 70.8 38.4 - 76.8 %   LYMPH% 17.7 14.0 - 49.7 %   MONO% 10.2 0.0 - 14.0 %   EOS% 0.7 0.0 - 7.0 %   BASO% 0.6 0.0 - 2.0 %  Comprehensive metabolic panel (Cmet) - [facility]  Result Value Ref Range   Sodium 144 136 - 145 mEq/L   Potassium 4.2 3.5 - 5.1 mEq/L   Chloride 107 98 - 109 mEq/L   CO2 28 22 - 29 mEq/L   Glucose 94 70 - 140 mg/dl   BUN 12.8 7.0 - 26.0 mg/dL   Creatinine 1.0 0.6 - 1.1 mg/dL   Total Bilirubin 0.43 0.20 - 1.20 mg/dL   Alkaline Phosphatase 55 40 - 150 U/L   AST 22 5 - 34 U/L   ALT 13 0 - 55 U/L   Total Protein 7.6 6.4 - 8.3 g/dL   Albumin 4.1 3.5 - 5.0 g/dL   Calcium 9.8 8.4 - 10.4 mg/dL   Anion Gap 9 3 - 11 mEq/L     Studies/Results:  05-28-2014 Breast Center:     CLINICAL DATA: 48 year old female status post surgical excision of atypical lobular hyperplasia in the left breast in April of 2015. Short-term interval followup of calcifications in the right breast.  EXAM: DIGITAL DIAGNOSTIC BILATERAL MAMMOGRAM WITH 3D TOMOSYNTHESIS AND CAD  COMPARISON: With priors.  ACR Breast Density Category c: The breast tissue is heterogeneously dense, which may obscure small masses.  FINDINGS: 2 mm stable grouped coarse calcifications in the 12 o'clock region the right breast are visualized. They are felt to likely be benign. Lumpectomy changes are seen in the left breast. There is no suspicious mass or malignant type microcalcifications.  Mammographic images were processed with CAD.  IMPRESSION: Stable probable benign calcifications in the right breast. No evidence of malignancy in the left  breast.  RECOMMENDATION: Right diagnostic mammogram in 6 months is recommended.  I have discussed the findings and recommendations with the patient. Results were also provided in writing at the conclusion of the visit. If applicable, a reminder letter will be sent to the patient regarding the next appointment.  BI-RADS CATEGORY 3: Probably benign.      Medications: I have reviewed the patient's current medications. She is not anxious to try other medications for hot flashes, tho we discussed effexor. I asked her to try OTC prilosec or zantac daily for a month, to see if this helps the low grade nausea.  DISCUSSION: ok to hold tamoxifen for 2-3 days ~ once a month to see if sleep/ hot flashes improve significantly without this  Assessment/Plan:  1.atypical lobular hyperplasia left breast diagnosed 09-23-13 and post lumpectomy with clear margins 10-21-13:  Tamoxifen begun 12-15-2013 in preventative attempt. Follow up right diagnostic mammogram May 2016. I will see her back shortly after mammogram in May. 2.calcifications in right breast apparently stable and thought benign: follow  up mammograms planned in 6 months  3.chronic insomnia  4.elevated cholesterol  5.Raynaud's, improved with procardia  6.post surgery for plantar fasciitis  7.right hip bursitis  8.post uterine fibroidectomy at time of C section 15 years ago   All questions answered. She knows to call prior to next scheduled visit if needed.   LIVESAY,LENNIS P, MD   06/19/2014, 8:33 AM

## 2014-06-24 ENCOUNTER — Other Ambulatory Visit: Payer: Self-pay | Admitting: Obstetrics and Gynecology

## 2014-06-25 LAB — CYTOLOGY - PAP

## 2014-06-27 ENCOUNTER — Telehealth: Payer: Self-pay | Admitting: Genetic Counselor

## 2014-06-27 NOTE — Telephone Encounter (Signed)
Dr. Mariana Kaufman nurse called stating that patient is concerned about a letter from insurance company stating that they needed documentation for the medical necessity for genetic testing before they would pay the bill. Katherine [name] saw the patient.  I will have Ofri contact the patient and/or the lab to ensure that there is not anything further that they need from [place] to help get the testing covered.

## 2014-06-27 NOTE — Telephone Encounter (Signed)
Called pt and got her VM. Left a detailed message that she should not be receiving a bill for testing if she did not get a phone call from the lab. Explained I will provide any info needed to process the testing once I receive a request. Left Ambry's phone number [phone]) and told her to ask for billing. More than likely, she got an Explanation of Benefits (EOB). Left my number if she has additional questions.

## 2014-09-04 ENCOUNTER — Telehealth: Payer: Self-pay | Admitting: Oncology

## 2014-09-04 NOTE — Telephone Encounter (Signed)
, °

## 2014-11-05 ENCOUNTER — Other Ambulatory Visit: Payer: Self-pay | Admitting: Oncology

## 2014-11-05 DIAGNOSIS — R921 Mammographic calcification found on diagnostic imaging of breast: Secondary | ICD-10-CM

## 2014-11-06 ENCOUNTER — Telehealth: Payer: Self-pay

## 2014-11-06 NOTE — Telephone Encounter (Signed)
Faxed signed orders for breast Center appointment dated 11-06-14 to the [facility]. Sent a copy to HIM to be scanned into patient's EMR.

## 2014-12-08 ENCOUNTER — Telehealth: Payer: Self-pay | Admitting: Oncology

## 2014-12-08 NOTE — Telephone Encounter (Signed)
Patient called in to reschedule her appointment °

## 2014-12-12 ENCOUNTER — Ambulatory Visit
Admission: RE | Admit: 2014-12-12 | Discharge: 2014-12-12 | Disposition: A | Discharge status: Home / Self Care | Payer: 59 | Source: Ambulatory Visit | Attending: Oncology | Admitting: Oncology

## 2014-12-12 DIAGNOSIS — R921 Mammographic calcification found on diagnostic imaging of breast: Secondary | ICD-10-CM

## 2014-12-18 ENCOUNTER — Ambulatory Visit: Payer: 59 | Admitting: Oncology

## 2014-12-18 ENCOUNTER — Other Ambulatory Visit: Payer: 59

## 2015-01-01 ENCOUNTER — Other Ambulatory Visit: Payer: Self-pay | Admitting: *Deleted

## 2015-01-01 ENCOUNTER — Telehealth: Payer: Self-pay | Admitting: *Deleted

## 2015-01-01 ENCOUNTER — Encounter: Payer: Self-pay | Admitting: *Deleted

## 2015-01-01 DIAGNOSIS — N6099 Unspecified benign mammary dysplasia of unspecified breast: Secondary | ICD-10-CM

## 2015-01-01 NOTE — Telephone Encounter (Signed)
Unable to reach patient at time of Pre-Visit Call.  Left message for patient to return call when available.    

## 2015-01-01 NOTE — Addendum Note (Signed)
Addended by: Leticia Penna A on: 01/01/2015 05:03 PM   Modules accepted: Medications

## 2015-01-01 NOTE — Telephone Encounter (Signed)
Pre-Visit Call completed with patient and chart updated.   Pre-Visit Info documented in Specialty Comments under SnapShot.    

## 2015-01-04 ENCOUNTER — Other Ambulatory Visit: Payer: Self-pay | Admitting: Oncology

## 2015-01-05 ENCOUNTER — Encounter: Payer: Self-pay | Admitting: Oncology

## 2015-01-05 ENCOUNTER — Ambulatory Visit (HOSPITAL_BASED_OUTPATIENT_CLINIC_OR_DEPARTMENT_OTHER): Payer: 59 | Admitting: Oncology

## 2015-01-05 ENCOUNTER — Encounter: Payer: Self-pay | Admitting: Family Medicine

## 2015-01-05 ENCOUNTER — Other Ambulatory Visit (HOSPITAL_BASED_OUTPATIENT_CLINIC_OR_DEPARTMENT_OTHER): Payer: 59

## 2015-01-05 ENCOUNTER — Ambulatory Visit (INDEPENDENT_AMBULATORY_CARE_PROVIDER_SITE_OTHER): Payer: 59 | Admitting: Family Medicine

## 2015-01-05 VITALS — BP 110/68 | HR 70 | Temp 97.9°F | Ht 66.0 in | Wt 142.5 lb

## 2015-01-05 VITALS — BP 133/73 | HR 66 | Temp 98.4°F | Resp 18 | Ht 66.0 in | Wt 141.5 lb

## 2015-01-05 DIAGNOSIS — R55 Syncope and collapse: Secondary | ICD-10-CM

## 2015-01-05 DIAGNOSIS — N6092 Unspecified benign mammary dysplasia of left breast: Secondary | ICD-10-CM

## 2015-01-05 DIAGNOSIS — R110 Nausea: Secondary | ICD-10-CM

## 2015-01-05 DIAGNOSIS — N62 Hypertrophy of breast: Secondary | ICD-10-CM

## 2015-01-05 DIAGNOSIS — Z0000 Encounter for general adult medical examination without abnormal findings: Secondary | ICD-10-CM | POA: Diagnosis not present

## 2015-01-05 DIAGNOSIS — Z79811 Long term (current) use of aromatase inhibitors: Secondary | ICD-10-CM | POA: Diagnosis not present

## 2015-01-05 DIAGNOSIS — G4730 Sleep apnea, unspecified: Secondary | ICD-10-CM

## 2015-01-05 DIAGNOSIS — E7800 Pure hypercholesterolemia, unspecified: Secondary | ICD-10-CM

## 2015-01-05 DIAGNOSIS — Z8619 Personal history of other infectious and parasitic diseases: Secondary | ICD-10-CM | POA: Insufficient documentation

## 2015-01-05 DIAGNOSIS — G4700 Insomnia, unspecified: Secondary | ICD-10-CM | POA: Diagnosis not present

## 2015-01-05 DIAGNOSIS — R002 Palpitations: Secondary | ICD-10-CM | POA: Diagnosis not present

## 2015-01-05 DIAGNOSIS — N6099 Unspecified benign mammary dysplasia of unspecified breast: Secondary | ICD-10-CM

## 2015-01-05 DIAGNOSIS — N951 Menopausal and female climacteric states: Secondary | ICD-10-CM | POA: Diagnosis not present

## 2015-01-05 LAB — CBC WITH DIFFERENTIAL/PLATELET
BASO%: 1.2 % (ref 0.0–2.0)
BASOS ABS: 0.1 10*3/uL (ref 0.0–0.1)
EOS ABS: 0.1 10*3/uL (ref 0.0–0.5)
EOS%: 0.9 % (ref 0.0–7.0)
HCT: 40.5 % (ref 34.8–46.6)
HEMOGLOBIN: 13.6 g/dL (ref 11.6–15.9)
LYMPH%: 20.6 % (ref 14.0–49.7)
MCH: 31.2 pg (ref 25.1–34.0)
MCHC: 33.6 g/dL (ref 31.5–36.0)
MCV: 92.7 fL (ref 79.5–101.0)
MONO#: 0.6 10*3/uL (ref 0.1–0.9)
MONO%: 9.7 % (ref 0.0–14.0)
NEUT%: 67.6 % (ref 38.4–76.8)
NEUTROS ABS: 4.3 10*3/uL (ref 1.5–6.5)
PLATELETS: 195 10*3/uL (ref 145–400)
RBC: 4.37 10*6/uL (ref 3.70–5.45)
RDW: 12.5 % (ref 11.2–14.5)
WBC: 6.3 10*3/uL (ref 3.9–10.3)
lymph#: 1.3 10*3/uL (ref 0.9–3.3)

## 2015-01-05 LAB — COMPREHENSIVE METABOLIC PANEL (CC13)
ALT: 16 U/L (ref 0–55)
ANION GAP: 9 meq/L (ref 3–11)
AST: 25 U/L (ref 5–34)
Albumin: 4 g/dL (ref 3.5–5.0)
Alkaline Phosphatase: 66 U/L (ref 40–150)
BUN: 17.7 mg/dL (ref 7.0–26.0)
CO2: 27 meq/L (ref 22–29)
CREATININE: 1 mg/dL (ref 0.6–1.1)
Calcium: 9.6 mg/dL (ref 8.4–10.4)
Chloride: 105 mEq/L (ref 98–109)
EGFR: 63 mL/min/{1.73_m2} — AB (ref >90)
Glucose: 96 mg/dl (ref 70–140)
Potassium: 3.7 mEq/L (ref 3.5–5.1)
Sodium: 140 mEq/L (ref 136–145)
Total Bilirubin: 0.39 mg/dL (ref 0.20–1.20)
Total Protein: 7.5 g/dL (ref 6.4–8.3)

## 2015-01-05 MED ORDER — TAMOXIFEN CITRATE 10 MG PO TABS: 20.0000 mg | ORAL_TABLET | Freq: Every day | ORAL | Status: DC

## 2015-01-05 MED ORDER — DICLOFENAC SODIUM 50 MG PO TBEC: 50.0000 mg | DELAYED_RELEASE_TABLET | Freq: Two times a day (BID) | ORAL | Status: DC | PRN

## 2015-01-05 MED ORDER — ZALEPLON 10 MG PO CAPS: 10.0000 mg | ORAL_CAPSULE | Freq: Every evening | ORAL | Status: DC | PRN

## 2015-01-05 MED ORDER — ONDANSETRON HCL 4 MG PO TABS: 4.0000 mg | ORAL_TABLET | Freq: Two times a day (BID) | ORAL | Status: DC | PRN

## 2015-01-05 NOTE — Patient Instructions (Addendum)
Encouraged good sleep hygiene such as dark, quiet room. No blue/green glowing lights such as screens in bedroom. No alcohol or stimulants in evening. Cut down on caffeine as able. Regular exercise is helpful but not just prior to bed time. Melatonin at bed time 2-10 mg prior, try liquid   Krill oil caps daily, MegaRed  Probiotics daily, Digestive Advantage or Farrell Vitamin D 2000 to 5000 IU daily Curcumen/Turmeric caps daily Ginger for nausea  Insomnia Insomnia is frequent trouble falling and/or staying asleep. Insomnia can be a long term problem or a short term problem. Both are common. Insomnia can be a short term problem when the wakefulness is related to a certain stress or worry. Long term insomnia is often related to ongoing stress during waking hours and/or poor sleeping habits. Overtime, sleep deprivation itself can make the problem worse. Every little thing feels more severe because you are overtired and your ability to cope is decreased. CAUSES   Stress, anxiety, and depression.  Poor sleeping habits.  Distractions such as TV in the bedroom.  Naps close to bedtime.  Engaging in emotionally charged conversations before bed.  Technical reading before sleep.  Alcohol and other sedatives. They may make the problem worse. They can hurt normal sleep patterns and normal dream activity.  Stimulants such as caffeine for several hours prior to bedtime.  Pain syndromes and shortness of breath can cause insomnia.  Exercise late at night.  Changing time zones may cause sleeping problems (jet lag). It is sometimes helpful to have someone observe your sleeping patterns. They should look for periods of not breathing during the night (sleep apnea). They should also look to see how long those periods last. If you live alone or observers are uncertain, you can also be observed at a sleep clinic where your sleep patterns will be professionally monitored. Sleep apnea requires a  checkup and treatment. Give your caregivers your medical history. Give your caregivers observations your family has made about your sleep.  SYMPTOMS   Not feeling rested in the morning.  Anxiety and restlessness at bedtime.  Difficulty falling and staying asleep. TREATMENT   Your caregiver may prescribe treatment for an underlying medical disorders. Your caregiver can give advice or help if you are using alcohol or other drugs for self-medication. Treatment of underlying problems will usually eliminate insomnia problems.  Medications can be prescribed for short time use. They are generally not recommended for lengthy use.  Over-the-counter sleep medicines are not recommended for lengthy use. They can be habit forming.  You can promote easier sleeping by making lifestyle changes such as:  Using relaxation techniques that help with breathing and reduce muscle tension.  Exercising earlier in the day.  Changing your diet and the time of your last meal. No night time snacks.  Establish a regular time to go to bed.  Counseling can help with stressful problems and worry.  Soothing music and white noise may be helpful if there are background noises you cannot remove.  Stop tedious detailed work at least one hour before bedtime. HOME CARE INSTRUCTIONS   Keep a diary. Inform your caregiver about your progress. This includes any medication side effects. See your caregiver regularly. Take note of:  Times when you are asleep.  Times when you are awake during the night.  The quality of your sleep.  How you feel the next day. This information will help your caregiver care for you.  Get out of bed if you are still  awake after 15 minutes. Read or do some quiet activity. Keep the lights down. Wait until you feel sleepy and go back to bed.  Keep regular sleeping and waking hours. Avoid naps.  Exercise regularly.  Avoid distractions at bedtime. Distractions include watching television  or engaging in any intense or detailed activity like attempting to balance the household checkbook.  Develop a bedtime ritual. Keep a familiar routine of bathing, brushing your teeth, climbing into bed at the same time each night, listening to soothing music. Routines increase the success of falling to sleep faster.  Use relaxation techniques. This can be using breathing and muscle tension release routines. It can also include visualizing peaceful scenes. You can also help control troubling or intruding thoughts by keeping your mind occupied with boring or repetitive thoughts like the old concept of counting sheep. You can make it more creative like imagining planting one beautiful flower after another in your backyard garden.  During your day, work to eliminate stress. When this is not possible use some of the previous suggestions to help reduce the anxiety that accompanies stressful situations. MAKE SURE YOU:   Understand these instructions.  Will watch your condition.  Will get help right away if you are not doing well or get worse. Document Released: 07/01/2000 Document Revised: 09/26/2011 Document Reviewed: 08/01/2007 [facility] Patient Information 2015 [place], [place]. This information is not intended to replace advice given to you by your health care provider. Make sure you discuss any questions you have with your health care provider.

## 2015-01-05 NOTE — Assessment & Plan Note (Signed)
Check level with the next labs and continue supplements

## 2015-01-05 NOTE — Progress Notes (Signed)
OFFICE PROGRESS NOTE   January 05, 2015   Physicians:Matthew Thomasena Edis; C.Dohmeier, Freda Munro  INTERVAL HISTORY:  Patient is seen, alone for visit, in scheduled follow up of atypical lobular hyperplasia of left breast, this treated with lumpectomy 10-21-13 and on tamoxifen in preventative attempt since 12-15-13.  She had 6 month follow up right mammogram at [facility] 12-12-14 with stable 3 mm calcifications in upper inner quadrant thought benign; she is due bilateral mammograms at [facility] in November. She has extremely dense breast tissue such that 3D tomo mammography is medically necessary.  PCP is now Dr Willette Alma She is up to date on gyn exams by Dr Ouida Sills, in Dec.   Hot flashes have been somewhat less frequent and not as severe since she was here last, tho worse in evenings and still wake her at least 3x at night. She has learned some triggers for the hot flashes, which include sleeping on her back.  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   Genetics testing normal 01-13-14  Son Jeneen Rinks will be high school junior in fall.   ONCOLOGIC HISTORY Patient had bilateral screening mammograms at [facility] 02-15-2013 with bilateral calcifications, followed up with bilateral diagnostic mammograms at [facility] 03-05-2013. Breast tissue is heterogeneously dense. She had 6 month follow up on 09-06-2013 (that report actually dated 10-29-13 in EMR) with slight increase in left breast calcifications and stable right breast calcifications thought likely benign; patient requested biopsy of the left breast rather than mammographic follow up. Left stereotactic needle biopsy 09-23-2013 (~11:00 , 7cm from nipple) found atypical lobular hyperplasia ([id number]). She was referred to Dr Donne Hazel, with left radioactive seed guided excisional lumpectomy on 10-21-2013. Pathology [phone]) found focal atypical lobular hyperplasia adjacent to the  biopsy site, not involving margins. Specimen size was 5.2 x 4 x 1 to 2.2 cm and specimen radiograph confirmed seed and biopsy clip placement. She had local hematoma postoperatively, which is progressively improving. She did not have MRI. 6 month follow up diagnostic tomo mammograms bilateral 05-28-14 .     Review of systems as above, also: No recent infectious illness. No new or different pain. No stomach discomfort if she eats small amounts regularly. Bowels ok. No SOB. No bleeding. Remainder of 10 point Review of Systems negative.  Objective:  Vital signs in last 24 hours:  BP 133/73 mmHg  Pulse 66  Temp(Src) 98.4 F (36.9 C) (Oral)  Resp 18  Ht _0  (1.676 m)  Wt 141 lb 8 oz (64.184 kg)  BMI 22.85 kg/m2  SpO2 100% Weight up 2 lbs Alert, oriented and appropriate. Ambulatory without difficulty.   HEENT:PERRL, sclerae not icteric. Oral mucosa moist without lesions, posterior pharynx clear.  Neck supple. No JVD.  Lymphatics:no cervical,supraclavicular, axillary or inguinal adenopathy Resp: clear to auscultation bilaterally and normal percussion bilaterally Cardio: regular rate and rhythm. No gallop. GI: soft, nontender, not distended, no mass or organomegaly. Normally active bowel sounds.  Musculoskeletal/ Extremities: without pitting edema, cords, tenderness Neuro: no peripheral neuropathy. Otherwise nonfocal Skin without rash, ecchymosis, petechiae Breasts: left lumpectomy scar well healed and bilaterally without dominant mass, skin or nipple findings. Axillae benign.   Lab Results:  Results for orders placed or performed in visit on 01/05/15  CBC with Differential  Result Value Ref Range   WBC 6.3 3.9 - 10.3 10e3/uL   NEUT# 4.3 1.5 - 6.5 10e3/uL   HGB 13.6 11.6 - 15.9 g/dL  HCT 40.5 34.8 - 46.6 %   Platelets 195 145 - 400 10e3/uL   MCV 92.7 79.5 - 101.0 fL   MCH 31.2 25.1 - 34.0 pg   MCHC 33.6 31.5 - 36.0 g/dL   RBC 4.37 3.70 - 5.45 10e6/uL   RDW 12.5 11.2 -  14.5 %   lymph# 1.3 0.9 - 3.3 10e3/uL   MONO# 0.6 0.1 - 0.9 10e3/uL   Eosinophils Absolute 0.1 0.0 - 0.5 10e3/uL   Basophils Absolute 0.1 0.0 - 0.1 10e3/uL   NEUT% 67.6 38.4 - 76.8 %   LYMPH% 20.6 14.0 - 49.7 %   MONO% 9.7 0.0 - 14.0 %   EOS% 0.9 0.0 - 7.0 %   BASO% 1.2 0.0 - 2.0 %  Comprehensive metabolic panel  Result Value Ref Range   Sodium 140 136 - 145 mEq/L   Potassium 3.7 3.5 - 5.1 mEq/L   Chloride 105 98 - 109 mEq/L   CO2 27 22 - 29 mEq/L   Glucose 96 70 - 140 mg/dl   BUN 17.7 7.0 - 26.0 mg/dL   Creatinine 1.0 0.6 - 1.1 mg/dL   Total Bilirubin 0.39 0.20 - 1.20 mg/dL   Alkaline Phosphatase 66 40 - 150 U/L   AST 25 5 - 34 U/L   ALT 16 0 - 55 U/L   Total Protein 7.5 6.4 - 8.3 g/dL   Albumin 4.0 3.5 - 5.0 g/dL   Calcium 9.6 8.4 - 10.4 mg/dL   Anion Gap 9 3 - 11 mEq/L   EGFR 63 (L) >90 ml/min/1.73 m2   labs reviewed with patient at time of visit  Studies/Results: DIGITAL DIAGNOSTIC RIGHT MAMMOGRAM WITH CAD   12-12-14 COMPARISON: 05/28/2014, 09/06/2013, 03/05/2013, additional prior studies dating back to 10/08/2008  ACR Breast Density Category c: The breast tissue is heterogeneously dense, which may obscure small masses.  FINDINGS: The previously noted 3 mm group of punctate calcifications within the upper, inner right breast is not significantly changed from prior study. No new or suspicious abnormality is identified.  Mammographic images were processed with CAD.  IMPRESSION: Probably benign right breast calcifications.  RECOMMENDATION: Bilateral diagnostic mammogram in November 2016 to complete the patient's follow-up of the right breast calcifications.  I have discussed the findings and recommendations with the patient. Results were also provided in writing at the conclusion of the visit. If applicable, a reminder letter will be sent to the patient regarding the next appointment.  BI-RADS CATEGORY 3: Probably benign finding(s) - short  interval follow-up suggested.   Medications: I have reviewed the patient's current medications.  DISCUSSION: we have mentioned clonidine at hs for hot flashes, which sometimes can be helpful and generally can be tolerated carefully even without elevated BP, however she is more comfortable overall with the hot flashes and does not want more medication. She understands follow up mammograms. She is comfortable continuing tamoxifen.  Assessment/Plan:  1.atypical lobular hyperplasia left breast diagnosed 09-23-13 and post lumpectomy with clear margins 10-21-13: Tamoxifen begun 12-15-2013 in preventative attempt. Bilateral mammograms will be in Nov. I will see her back in 6 months or sooner if needed. Needs yearly gyn exams on the tamoxifen. Hot flashes, which increased with tamoxifen, are not quite as severe. 2.calcifications in right breast appear stable and thought benign: follow up as above 3.chronic insomnia  4.elevated cholesterol  5.Raynaud's, improved with procardia  6.post surgery for plantar fasciitis  7.right hip bursitis  8.post uterine fibroidectomy at time of C section    All  questions answered. Time spent 15 min including >50% counseling and coordination of care.    Alaena Strader P, MD   01/05/2015, 7:45 PM

## 2015-01-05 NOTE — Assessment & Plan Note (Signed)
Encouraged heart healthy diet, increase exercise, avoid trans fats, consider a krill oil cap daily 

## 2015-01-05 NOTE — Assessment & Plan Note (Deleted)
Encouraged good sleep hygiene such as dark, quiet room. No blue/green glowing lights such as computer screens in bedroom. No alcohol or stimulants in evening. Cut down on caffeine as able. Regular exercise is helpful but not just prior to bed time.  

## 2015-01-05 NOTE — Progress Notes (Signed)
Pre visit review using our clinic review tool, if applicable. No additional management support is needed unless otherwise documented below in the visit note. 

## 2015-01-05 NOTE — Assessment & Plan Note (Signed)
No recent flares 

## 2015-01-05 NOTE — Progress Notes (Signed)
Katherine [name]  [id number] August 27, 1965 01/05/2015      Progress Note-Follow Up  Subjective  Chief Complaint  Chief Complaint  Patient presents with  . Establish Care    transfer from Dr. Linna Darner    HPI  Patient is a 49 y.o. female in today for routine medical care. Patient is in today to establish care. She has a past medical history that includes some precancerous changes in her breast for which she follows with oncology. She has chronic pain most nobody in her right hand and knees and follows with rheumatology for this. She is diagnosed with sleep apnea and has a mouth piece which works well. It was made for her by Dr. Maureen Chatters. She follows with Dr. Ouida Sills for her gynecologic care. She does note trouble with sleep is long-standing. Uses Sonata with reasonably good results. No recent illness or fevers. Follows with Dr. Orvan July for her colonoscopies. Denies CP/palp/SOB/HA/congestion/fevers/GI or GU c/o. Taking meds as prescribed  Past Medical History  Diagnosis Date  . Raynaud's syndrome   . Pure hypercholesterolemia   . Plantar fasciitis   . Insomnia   . Insomnia, persistent 08/28/2013    Menopausal induced insomnia, early morning awakenings, and snoring.   . 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 PONV (postoperative nausea and vomiting)   . Heart murmur     ECHO 2012-trivial regurg-all else normal  . Atypical lobular hyperplasia of left breast     Past Surgical History  Procedure Laterality Date  . Cesarean section  1999  . Tonsillectomy and adenoidectomy  1981  . Uterine fibroidectomy      @ c section  . Plantar fasciitis surgery  2011, 2007    both feet  . Breast surgery  11/20/13    left breast; Dr Donne Hazel    Family History  Problem Relation Age of Onset  . Adopted: Yes  . Heart attack Maternal Uncle 37  . Cervical cancer Maternal Grandmother   . Stroke Mother     in 72s  . Diabetes Brother   . Depression Brother      1/2 brother  . Hypertension    . Heart Problems Maternal Grandmother     arrhythmia    History   Social History  . Marital Status: Married    Spouse Name: [name]  . Number of Children: 1  . Years of Education: BSN   Occupational History  . Palliative Care Nurse  [employer]   Social History Main Topics  . Smoking status: Former Smoker    Quit date: 10/16/1991  . Smokeless tobacco: Never Used     Comment: smoked 1981-1993, up to 1 ppd  . Alcohol Use: Yes     Comment: occasionally   . Drug Use: No  . Sexual Activity: Not on file   Other Topics Concern  . Not on file   Social History Narrative   [name]).   Patient has one child.   Patient is employed at [employer]   Patient has a Copywriter, advertising.   Patient drinks two cups of coffee every morning.   Regular exercise- Cory Roughen Do             Current Outpatient Prescriptions on File Prior to Visit  Medication Sig Dispense Refill  . diclofenac (VOLTAREN) 50 MG EC tablet Take 50 mg by mouth 2 (two) times daily as needed.    . Flaxseed, Linseed, (FLAX  SEEDS PO) Take by mouth. PHYTOESTROGEN- Soy, Flax seed and pomegranate    . Multiple Vitamins-Minerals (CENTRUM SILVER PO) Take by mouth daily.    Marland Kitchen NIFEdipine (PROCARDIA) 10 MG capsule Take 10 mg by mouth daily.    . ondansetron (ZOFRAN) 4 MG tablet Take 1-2 tablets (4-8 mg total) by mouth every 12 (twelve) hours as needed for nausea or vomiting. 30 tablet 0  . tamoxifen (NOLVADEX) 10 MG tablet Take 2 tablets (20 mg total) by mouth daily. 60 tablet 6  . zaleplon (SONATA) 10 MG capsule Take 1 capsule (10 mg total) by mouth at bedtime as needed for sleep. (Patient not taking: Reported on 01/05/2015) 30 capsule 1   No current facility-administered medications on file prior to visit.    Allergies  Allergen Reactions  . Ultram [Tramadol] Nausea And Vomiting  . Hydrocodone-Acetaminophen     REACTION: NAUSEA  . Oxycodone-Acetaminophen     REACTION:  NAUSEA    Review of Systems  Review of Systems  Constitutional: Positive for malaise/fatigue. Negative for fever and chills.  HENT: Negative for congestion, hearing loss and nosebleeds.   Eyes: Negative for discharge.  Respiratory: Negative for cough, sputum production, shortness of breath and wheezing.   Cardiovascular: Negative for chest pain, palpitations and leg swelling.  Gastrointestinal: Negative for heartburn, nausea, vomiting, abdominal pain, diarrhea, constipation and blood in stool.  Genitourinary: Negative for dysuria, urgency, frequency and hematuria.  Musculoskeletal: Positive for myalgias and joint pain. Negative for back pain and falls.  Skin: Negative for rash.  Neurological: Negative for dizziness, tremors, sensory change, focal weakness, loss of consciousness, weakness and headaches.  Endo/Heme/Allergies: Negative for polydipsia. Does not bruise/bleed easily.  Psychiatric/Behavioral: Negative for depression and suicidal ideas. The patient is not nervous/anxious and does not have insomnia.     Objective  BP 110/68 mmHg  Pulse 70  Temp(Src) 97.9 F (36.6 C) (Oral)  Ht 5\' 6"  (1.676 m)  Wt 142 lb 8 oz (64.638 kg)  BMI 23.01 kg/m2  SpO2 97%  Physical Exam  Physical Exam  Constitutional: She is oriented to person, place, and time and well-developed, well-nourished, and in no distress. No distress.  HENT:  Head: Normocephalic and atraumatic.  Right Ear: External ear normal.  Left Ear: External ear normal.  Nose: Nose normal.  Mouth/Throat: Oropharynx is clear and moist. No oropharyngeal exudate.  Eyes: Conjunctivae are normal. Pupils are equal, round, and reactive to light. Right eye exhibits no discharge. Left eye exhibits no discharge. No scleral icterus.  Neck: Normal range of motion. Neck supple. No thyromegaly present.  Cardiovascular: Normal rate, regular rhythm, normal heart sounds and intact distal pulses.   No murmur heard. Pulmonary/Chest: Effort  normal and breath sounds normal. No respiratory distress. She has no wheezes. She has no rales.  Abdominal: Soft. Bowel sounds are normal. She exhibits no distension and no mass. There is no tenderness.  Musculoskeletal: Normal range of motion. She exhibits no edema or tenderness.  Lymphadenopathy:    She has no cervical adenopathy.  Neurological: She is alert and oriented to person, place, and time. She has normal reflexes. No cranial nerve deficit. Coordination normal.  Skin: Skin is warm and dry. No rash noted. She is not diaphoretic.  Psychiatric: Mood, memory and affect normal.    Lab Results  Component Value Date   TSH 0.35 12/24/2013   Lab Results  Component Value Date   WBC 5.6 01/13/2014   HGB 13.5 01/13/2014   HCT 40.8 01/13/2014  MCV 92.6 01/13/2014   PLT 204 01/13/2014   Lab Results  Component Value Date   CREATININE 1.1 06/19/2014   BUN 15.7 06/19/2014   NA 140 06/19/2014   K 4.2 06/19/2014   CL 100 10/16/2013   CO2 26 06/19/2014   Lab Results  Component Value Date   ALT 17 06/19/2014   AST 24 06/19/2014   ALKPHOS 63 06/19/2014   BILITOT 0.53 06/19/2014   Lab Results  Component Value Date   CHOL 185 12/24/2013   Lab Results  Component Value Date   HDL 80.20 12/24/2013   Lab Results  Component Value Date   LDLCALC 96 12/24/2013   Lab Results  Component Value Date   TRIG 45.0 12/24/2013   Lab Results  Component Value Date   CHOLHDL 2 12/24/2013     Assessment & Plan  PURE HYPERCHOLESTEROLEMIA Encouraged heart healthy diet, increase exercise, avoid trans fats, consider a krill oil cap daily  Vitamin D deficiency Check level with the next labs and continue supplements  Insomnia w/ sleep apnea Encouraged good sleep hygiene such as dark, quiet room. No blue/green glowing lights such as computer screens in bedroom. No alcohol or stimulants in evening. Cut down on caffeine as able. Regular exercise is helpful but not just prior to bed time.     PALPITATIONS No recent flares  Other abnormal glucose Previous A1C normal. Hgba1c,  minimize simple carbs. Increase exercise as tolerated.   Atypical lobular hyperplasia of left breast Has appt with oncology today, Dr Marko Plume, tolerating Tamoxifen, less nausea than when she first started. Try ginger and Zofran prn

## 2015-01-05 NOTE — Assessment & Plan Note (Signed)
Has appt with oncology today, Dr Marko Plume, tolerating Tamoxifen, less nausea than when she first started. Try ginger and Zofran prn

## 2015-01-05 NOTE — Assessment & Plan Note (Signed)
Previous A1C normal. Hgba1c,  minimize simple carbs. Increase exercise as tolerated.

## 2015-01-05 NOTE — Assessment & Plan Note (Signed)
Encouraged good sleep hygiene such as dark, quiet room. No blue/green glowing lights such as computer screens in bedroom. No alcohol or stimulants in evening. Cut down on caffeine as able. Regular exercise is helpful but not just prior to bed time.  

## 2015-01-06 ENCOUNTER — Telehealth: Payer: Self-pay | Admitting: Oncology

## 2015-01-06 NOTE — Telephone Encounter (Signed)
lvm for pt regarding to NOV and DEc appt.....mailed pt appt sched/avs and letter

## 2015-01-09 LAB — HM COLONOSCOPY

## 2015-01-26 ENCOUNTER — Encounter: Payer: Self-pay | Admitting: Family Medicine

## 2015-06-15 ENCOUNTER — Ambulatory Visit
Admission: RE | Admit: 2015-06-15 | Discharge: 2015-06-15 | Disposition: A | Discharge status: Home / Self Care | Payer: 59 | Source: Ambulatory Visit | Attending: Oncology | Admitting: Oncology

## 2015-06-15 DIAGNOSIS — N6092 Unspecified benign mammary dysplasia of left breast: Secondary | ICD-10-CM

## 2015-06-22 ENCOUNTER — Telehealth: Payer: Self-pay | Admitting: Family Medicine

## 2015-06-22 DIAGNOSIS — G4700 Insomnia, unspecified: Secondary | ICD-10-CM

## 2015-06-22 NOTE — Telephone Encounter (Signed)
Fine to refill Sonata same strength, same sig, #30 with 1 rf

## 2015-06-22 NOTE — Telephone Encounter (Signed)
Requesting: Zaleplon Contract None UDS    None Last OV   01/05/2015 Last Refill   #30 with 3 refills on 01/05/2015  Please Advise

## 2015-06-22 NOTE — Telephone Encounter (Signed)
Relation to [id number] Call back number: [phone] Pharmacy: [facility]   Reason for call:  Patient requesting a refill zaleplon (SONATA) 10 MG capsule

## 2015-06-23 MED ORDER — ZALEPLON 10 MG PO CAPS: 10.0000 mg | ORAL_CAPSULE | Freq: Every evening | ORAL | Status: DC | PRN

## 2015-06-23 NOTE — Telephone Encounter (Signed)
Faxed to [facility].  

## 2015-06-23 NOTE — Telephone Encounter (Signed)
Printed and on counter for signature. 

## 2015-06-29 ENCOUNTER — Other Ambulatory Visit: Payer: Self-pay | Admitting: Obstetrics and Gynecology

## 2015-06-30 LAB — CYTOLOGY - PAP

## 2015-07-07 ENCOUNTER — Other Ambulatory Visit: Payer: 59

## 2015-07-08 ENCOUNTER — Other Ambulatory Visit: Payer: Self-pay | Admitting: Oncology

## 2015-07-09 ENCOUNTER — Ambulatory Visit (HOSPITAL_BASED_OUTPATIENT_CLINIC_OR_DEPARTMENT_OTHER): Payer: 59 | Admitting: Oncology

## 2015-07-09 ENCOUNTER — Encounter: Payer: Self-pay | Admitting: Oncology

## 2015-07-09 VITALS — BP 137/73 | HR 75 | Temp 98.4°F | Resp 18 | Ht 66.0 in | Wt 144.4 lb

## 2015-07-09 DIAGNOSIS — G4700 Insomnia, unspecified: Secondary | ICD-10-CM

## 2015-07-09 DIAGNOSIS — N6092 Unspecified benign mammary dysplasia of left breast: Secondary | ICD-10-CM

## 2015-07-09 DIAGNOSIS — N62 Hypertrophy of breast: Secondary | ICD-10-CM

## 2015-07-09 DIAGNOSIS — N951 Menopausal and female climacteric states: Secondary | ICD-10-CM | POA: Diagnosis not present

## 2015-07-09 DIAGNOSIS — R55 Syncope and collapse: Secondary | ICD-10-CM

## 2015-07-09 DIAGNOSIS — E2839 Other primary ovarian failure: Secondary | ICD-10-CM

## 2015-07-09 DIAGNOSIS — N6099 Unspecified benign mammary dysplasia of unspecified breast: Secondary | ICD-10-CM

## 2015-07-09 MED ORDER — TAMOXIFEN CITRATE 10 MG PO TABS
20.0000 mg | ORAL_TABLET | Freq: Every day | ORAL | Status: DC
Start: 2015-07-09 — End: 2016-02-10

## 2015-07-09 NOTE — Progress Notes (Signed)
OFFICE PROGRESS NOTE   July 09, 2015   Physicians:Matthew Holli Humbles; C.Dohmeier, Freda Munro, R.Buccini  INTERVAL HISTORY:  Patient is seen, alone for visit, in scheduled follow up of tamoxifen being used in preventative fashion since atypical lobular hyperplasia of left breast treated with lumpectomy 10-21-2013.  Last mammograms were 3D bilateral at [facility] 06-15-15, with heterogeneously dense breast tissue with stable 2 mm calcification on right unchanged from 02-2013 and otherwise stable with no mammographic findings of concern.  She had gyn exam with PAP by Dr Ouida Sills last week, begun on Effexor at hs for hot flashes. She has tolerated the Effexor without bothersome problems thus far (notices that she feels "a little more monotone") and may have had some improvement in hot flashes as she recalls only 2 episodes last pm, compared with 2-3 at night in June.  Colonoscopy done by Dr Cristina Gong summer 2016, next recommended in 10 years.  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  She has had no recent infectious illness. She has no symptoms from tamoxifen other than hot flashes. No vaginal bleeding. No symptoms of blood clots. No noted changes in breasts. No SOB or chest pain.  Remainder of 10 point Review of Systems negative.    Genetics testing normal 12-2013 Flu vaccine 04-16-15  Son Jeneen Rinks is enjoying junior year HS, working part time at [employer] Patient had bilateral screening mammograms at [facility] 02-15-2013 with bilateral calcifications, followed up with  bilateral diagnostic mammograms at [facility] 03-05-2013. Breast tissue is heterogeneously dense. She had 6 month follow up on 09-06-2013 (that report actually dated 10-29-13 in EMR) with slight increase in left breast calcifications and stable right breast calcifications thought likely benign; patient requested biopsy of the left breast rather than mammographic follow up. Left stereotactic needle biopsy 09-23-2013 (~11:00 , 7cm from nipple) found atypical lobular hyperplasia [id number] She was referred to Dr Donne Hazel, with left radioactive seed guided excisional lumpectomy on 10-21-2013. Pathology [id number]) found focal atypical lobular hyperplasia adjacent to the biopsy site, not involving margins. Specimen size was 5.2 x 4 x 1 to 2.2 cm and specimen radiograph confirmed seed and biopsy clip placement. She had local hematoma postoperatively, which is progressively improving. She did not have MRI.She began tamoxifen in preventative attempt 12-15-13    Objective:  Vital signs in last 24 hours:  BP 137/73 mmHg  Pulse 75  Temp(Src) 98.4 F (36.9 C) (Oral)  Resp 18  Ht 5\' 6"  (1.676 m)  Wt 144 lb 6.4 oz (65.499 kg)  BMI 23.32 kg/m2  SpO2 99% Weight up 3 lbs Alert, oriented and appropriate. Easily mobile. Looks comfortable, very pleasant as always   HEENT:PERRL, sclerae not icteric. Oral mucosa moist without lesions, posterior pharynx clear.  Neck supple. No JVD.  Lymphatics:no cervical,supraclavicular, axillary or inguinal adenopathy Resp: clear to auscultation bilaterally and normal percussion bilaterally Cardio: regular rate and rhythm. No gallop. GI: soft, nontender, not distended, no mass or organomegaly. Normally active bowel sounds.  Musculoskeletal/ Extremities: without pitting edema, cords, tenderness Neuro: nonfocal PSYCH appropriate mood and affect Skin without rash, ecchymosis, petechiae Breasts: left excisional biopsy  scar without local changes of concern, otherwise  bilaterally without dominant mass, skin or nipple findings. Axillae benign.   Labs planned upcoming by Dr Charlett Blake: CBC CMET TSH Lipids Vit D  Studies/Results: EXAM:  06-15-15  [facility] DIGITAL DIAGNOSTIC BILATERAL MAMMOGRAM WITH 3D TOMOSYNTHESIS AND CAD  COMPARISON: Previous exam(s).  ACR Breast Density Category c: The breast tissue is heterogeneously dense, which may obscure small masses.  FINDINGS: Magnification views of the upper inner right breast show a stable group of punctate calcifications that span 2 mm. The calcifications appear unchanged in appearance dating back to August 2014. No suspicious microcalcification, nonsurgical distortion, or suspicious mass is identified in either breast to suggest malignancy.  Mammographic images were processed with CAD.  IMPRESSION: No evidence of malignancy in either breast. Stable benign-appearing calcifications right breast.  RECOMMENDATION: Screening mammogram in one year.(Code:[id number])  I have discussed the findings and recommendations with the patient. Results were also provided in writing at the conclusion of the visit. If applicable, a reminder letter will be sent to the patient regarding the next appointment.  BI-RADS CATEGORY 2: Benign.  Medications: I have reviewed the patient's current medications. Continue tamoxifen, continue Effexor per Dr Ouida Sills  DISCUSSION Hot flashes, tamoxifen, effexor all discussed. Patient is in agreement with continuing treatment as planned. Hopefully new position will allow her to resume regular exercise.  I will see her back in 6 months or sooner if needed.    Assessment/Plan:  1.atypical lobular hyperplasia left breast diagnosed 09-23-13 and post lumpectomy with clear margins 10-21-13: Tamoxifen begun 12-15-2013 in preventative attempt, continue. Up to date on gyn exams. Next bilateral mammograms 05-2016. I will see her in 6 months or sooner if needed. 2.calcifications  in right breast stable and appear benign: radiologists recommend year follow up  3.chronic insomnia, worse with exacerbation of hot flashes by tamoxifen. May be slight improvement already on hs effexor. 4.elevated cholesterol  5.Raynaud's, improved with procardia  6.post surgery for plantar fasciitis  7.right hip bursitis  8.post uterine fibroidectomy at time of C section  9.flu vaccine 04-16-15 10. Job stress ongoing. Weight gain in past 2 years with less physical activity, plan as above. 11.labs by Dr Charlett Blake, also in EPIC 12.up to date on colonoscopy, done by Dr Cristina Gong summer 2016, next in 10 years  All questions answered and patient is in agreement with recommendations and plans. Time spent 20 min including >50% counseling and coordination of care. CC Dr Adline Mango, MD   07/09/2015, 11:14 AM

## 2015-07-10 ENCOUNTER — Telehealth: Payer: Self-pay | Admitting: Oncology

## 2015-07-10 NOTE — Telephone Encounter (Signed)
Called and left a message with 48mo follow up

## 2015-07-11 ENCOUNTER — Other Ambulatory Visit: Payer: Self-pay | Admitting: Oncology

## 2015-07-11 DIAGNOSIS — E2839 Other primary ovarian failure: Secondary | ICD-10-CM | POA: Insufficient documentation

## 2015-07-11 DIAGNOSIS — R55 Syncope and collapse: Secondary | ICD-10-CM | POA: Insufficient documentation

## 2015-07-14 ENCOUNTER — Encounter: Payer: 59 | Admitting: Family Medicine

## 2015-07-28 MED FILL — VENLAFAXINE HCL ER 37.5 MG: 37.5 | 30 days supply | Qty: 30 | Fill #0 | Status: TO

## 2015-08-12 MED FILL — TAMOXIFEN 10 MG TABLET: 10 | 30 days supply | Qty: 60 | Fill #1

## 2015-08-12 MED FILL — ZALEPLON 10 MG CAPSULE: 10 | 30 days supply | Qty: 30 | Fill #0

## 2015-08-17 ENCOUNTER — Other Ambulatory Visit (INDEPENDENT_AMBULATORY_CARE_PROVIDER_SITE_OTHER): Payer: 59

## 2015-08-17 DIAGNOSIS — Z0000 Encounter for general adult medical examination without abnormal findings: Secondary | ICD-10-CM | POA: Diagnosis not present

## 2015-08-17 LAB — LIPID PANEL
CHOLESTEROL: 172 mg/dL (ref 0–200)
HDL: 74.9 mg/dL (ref >39.00)
LDL Cholesterol: 87 mg/dL (ref 0–99)
NONHDL: 96.9
TRIGLYCERIDES: 51 mg/dL (ref 0.0–149.0)
Total CHOL/HDL Ratio: 2
VLDL: 10.2 mg/dL (ref 0.0–40.0)

## 2015-08-17 LAB — COMPREHENSIVE METABOLIC PANEL
ALBUMIN: 4.1 g/dL (ref 3.5–5.2)
ALK PHOS: 54 U/L (ref 39–117)
ALT: 14 U/L (ref 0–35)
AST: 23 U/L (ref 0–37)
BUN: 11 mg/dL (ref 6–23)
CALCIUM: 8.9 mg/dL (ref 8.4–10.5)
CO2: 28 mEq/L (ref 19–32)
Chloride: 101 mEq/L (ref 96–112)
Creatinine, Ser: 0.97 mg/dL (ref 0.40–1.20)
GFR: 64.23 mL/min (ref >60.00)
Glucose, Bld: 101 mg/dL — ABNORMAL HIGH (ref 70–99)
POTASSIUM: 3.6 meq/L (ref 3.5–5.1)
Sodium: 137 mEq/L (ref 135–145)
Total Bilirubin: 0.3 mg/dL (ref 0.2–1.2)
Total Protein: 7.2 g/dL (ref 6.0–8.3)

## 2015-08-17 LAB — CBC
HCT: 39.4 % (ref 36.0–46.0)
Hemoglobin: 12.9 g/dL (ref 12.0–15.0)
MCHC: 32.8 g/dL (ref 30.0–36.0)
MCV: 93.2 fl (ref 78.0–100.0)
PLATELETS: 193 10*3/uL (ref 150.0–400.0)
RBC: 4.23 Mil/uL (ref 3.87–5.11)
RDW: 12.7 % (ref 11.5–15.5)
WBC: 5.1 10*3/uL (ref 4.0–10.5)

## 2015-08-17 LAB — TSH: TSH: 1.47 u[IU]/mL (ref 0.35–4.50)

## 2015-08-17 LAB — VITAMIN D 25 HYDROXY (VIT D DEFICIENCY, FRACTURES): VITD: 41.86 ng/mL (ref 30.00–100.00)

## 2015-08-24 ENCOUNTER — Encounter: Payer: Self-pay | Admitting: Family Medicine

## 2015-08-24 ENCOUNTER — Ambulatory Visit (INDEPENDENT_AMBULATORY_CARE_PROVIDER_SITE_OTHER): Payer: 59 | Admitting: Family Medicine

## 2015-08-24 VITALS — BP 132/86 | HR 64 | Temp 98.5°F | Ht 66.0 in | Wt 145.2 lb

## 2015-08-24 DIAGNOSIS — R002 Palpitations: Secondary | ICD-10-CM | POA: Diagnosis not present

## 2015-08-24 DIAGNOSIS — G4700 Insomnia, unspecified: Secondary | ICD-10-CM | POA: Diagnosis not present

## 2015-08-24 DIAGNOSIS — F411 Generalized anxiety disorder: Secondary | ICD-10-CM

## 2015-08-24 DIAGNOSIS — E2839 Other primary ovarian failure: Secondary | ICD-10-CM | POA: Diagnosis not present

## 2015-08-24 DIAGNOSIS — E559 Vitamin D deficiency, unspecified: Secondary | ICD-10-CM | POA: Diagnosis not present

## 2015-08-24 DIAGNOSIS — G4730 Sleep apnea, unspecified: Secondary | ICD-10-CM

## 2015-08-24 MED ORDER — ZALEPLON 10 MG PO CAPS: 10.0000 mg | ORAL_CAPSULE | Freq: Every evening | ORAL | Status: DC | PRN

## 2015-08-24 MED ORDER — CLONIDINE HCL 0.1 MG PO TABS: 0.1000 mg | ORAL_TABLET | Freq: Every evening | ORAL | Status: DC | PRN

## 2015-08-24 MED ORDER — VENLAFAXINE HCL ER 37.5 MG PO CP24: 37.5000 mg | ORAL_CAPSULE | Freq: Every day | ORAL | Status: DC

## 2015-08-24 MED FILL — cloNIDine HCL 0.1 MG TABS: 0.1 | 30 days supply | Qty: 30 | Fill #0

## 2015-08-24 MED FILL — VENLAFAXINE HCL ER 37.5 MG: 37.5 | 30 days supply | Qty: 30 | Fill #0

## 2015-08-24 NOTE — Patient Instructions (Signed)

## 2015-08-24 NOTE — Progress Notes (Signed)
Subjective:    Patient ID: [name], female    DOB: 12-04-1965, 50 y.o.   MRN: [id number]  Chief Complaint  Patient presents with  . Follow-up    HPI Patient is in today for follow up and numerous concerns with hot flashes during the night.  She was started on Effexor by her gynecologist for hot flashes. It has not helped the hot flashes but it has helped her mood. She feels better and less anxious. Does note she is not sleeping well due to the hot flashes. No recent illness. Palpitations well controlled on current meds. Denies CP/palp/SOB/HA/congestion/fevers/GI or GU c/o. Taking meds as prescribed    Past Medical History  Diagnosis Date  . Raynaud's syndrome   . Pure hypercholesterolemia   . Plantar fasciitis   . Insomnia   . Insomnia, persistent 08/28/2013    Menopausal induced insomnia, early morning awakenings, and snoring.   . 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 PONV (postoperative nausea and vomiting)   . Heart murmur     ECHO 2012-trivial regurg-all else normal  . Atypical lobular hyperplasia of left breast   . History of chicken pox     Past Surgical History  Procedure Laterality Date  . Tonsillectomy and adenoidectomy  1981  . Uterine fibroidectomy      @ c section  . Plantar fasciitis surgery  2011, 2007    both feet  . Breast surgery  11/20/13    left breast; Dr Donne Hazel  . Cesarean section  1999    Family History  Problem Relation Age of Onset  . Adopted: Yes  . Heart attack Maternal Uncle 37  . Heart disease Maternal Uncle 37    MI  . Cervical cancer Maternal Grandmother   . Heart Problems Maternal Grandmother     arrhythmia  . Cancer Maternal Grandmother     gyn  . Stroke Mother     in 19s  . Diabetes Mother   . Heart disease Mother   . Hyperlipidemia Mother   . Hyperlipidemia Brother   . Depression Brother     1/2 brother  . Hypertension    . Cancer Maternal Aunt     numerous breast  cancers  . Hypertension Paternal Grandmother   . Hypertension Paternal Grandfather     Social History   Social History  . Marital Status: Married    Spouse Name: [name]  . Number of Children: 1  . Years of Education: BSN   Occupational History  . Palliative Care Nurse  [employer]   Social History Main Topics  . Smoking status: Former Smoker    Quit date: 10/16/1991  . Smokeless tobacco: Never Used     Comment: smoked 1981-1993, up to 1 ppd  . Alcohol Use: Yes     Comment: occasionally   . Drug Use: No  . 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 Not on file   Social History Narrative   [name]).   Patient has one child.   Patient is employed at [employer].   Patient has a Copywriter, advertising.   Patient drinks two cups of coffee every morning.   Regular exercise- [employer]  Outpatient Prescriptions Prior to Visit  Medication Sig Dispense Refill  . Ibuprofen-Diphenhydramine Cit (ADVIL PM PO) Take by mouth at bedtime.    . Multiple Vitamins-Minerals (CENTRUM SILVER PO) Take by mouth daily.    . tamoxifen (NOLVADEX) 10 MG tablet Take 2 tablets (20 mg total) by mouth daily. 60 tablet 5  . venlafaxine XR (EFFEXOR-XR) 37.5 MG 24 hr capsule 37.5 mg at bedtime.   3  . zaleplon (SONATA) 10 MG capsule Take 1 capsule (10 mg total) by mouth at bedtime as needed for sleep. 30 capsule 1  . diclofenac (VOLTAREN) 50 MG EC tablet Take 1 tablet (50 mg total) by mouth 2 (two) times daily as needed. (Patient not taking: Reported on 08/24/2015) 60 tablet 1  . NIFEdipine (PROCARDIA) 10 MG capsule Take 10 mg by mouth daily as needed. Reported on 08/24/2015    . ondansetron (ZOFRAN) 4 MG tablet Take 1-2 tablets (4-8 mg total) by mouth every 12 (twelve) hours as needed for nausea or vomiting. (Patient not taking: Reported on  07/09/2015) 30 tablet 0   No facility-administered medications prior to visit.    Allergies  Allergen Reactions  . Ultram [Tramadol] Nausea And Vomiting  . Hydrocodone-Acetaminophen     REACTION: NAUSEA  . Oxycodone-Acetaminophen     REACTION: NAUSEA    Review of Systems  Constitutional: Negative for fever and malaise/fatigue.  HENT: Negative for congestion.   Eyes: Negative for discharge.  Respiratory: Negative for shortness of breath.   Cardiovascular: Positive for palpitations and leg swelling. Negative for chest pain.  Gastrointestinal: Negative for nausea and abdominal pain.  Genitourinary: Negative for dysuria.  Musculoskeletal: Negative for falls.  Skin: Negative for rash.  Neurological: Negative for loss of consciousness and headaches.  Endo/Heme/Allergies: Negative for environmental allergies.  Psychiatric/Behavioral: Negative for depression. The patient is nervous/anxious and has insomnia.        Objective:    Physical Exam  Constitutional: She is oriented to person, place, and time. She appears well-developed and well-nourished. No distress.  HENT:  Head: Normocephalic and atraumatic.  Nose: Nose normal.  Eyes: Right eye exhibits no discharge. Left eye exhibits no discharge.  Neck: Normal range of motion. Neck supple.  Cardiovascular: Normal rate and regular rhythm.   No murmur heard. Pulmonary/Chest: Effort normal and breath sounds normal.  Abdominal: Soft. Bowel sounds are normal. There is no tenderness.  Musculoskeletal: She exhibits no edema.  Neurological: She is alert and oriented to person, place, and time.  Skin: Skin is warm and dry.  Psychiatric: She has a normal mood and affect.  Nursing note and vitals reviewed.   BP 132/86 mmHg  Pulse 64  Temp(Src) 98.5 F (36.9 C) (Oral)  Ht 5' 6"  (1.676 m)  Wt 145 lb 4 oz (65.885 kg)  BMI 23.46 kg/m2  SpO2 100% Wt Readings from Last 3 Encounters:  08/24/15 145 lb 4 oz (65.885 kg)  07/09/15 144 lb  6.4 oz (65.499 kg)  01/05/15 141 lb 8 oz (64.184 kg)     Lab Results  Component Value Date   WBC 5.1 08/17/2015   HGB 12.9 08/17/2015   HCT 39.4 08/17/2015   PLT 193.0 08/17/2015   GLUCOSE 101* 08/17/2015   CHOL 172 08/17/2015   TRIG 51.0 08/17/2015   HDL 74.90 08/17/2015   LDLCALC 87 08/17/2015   ALT 14 08/17/2015   AST 23 08/17/2015   NA 137 08/17/2015   K 3.6 08/17/2015   CL 101 08/17/2015   CREATININE 0.97 08/17/2015  BUN 11 08/17/2015   CO2 28 08/17/2015   TSH 1.47 08/17/2015   HGBA1C 5.3 12/24/2013    Lab Results  Component Value Date   TSH 1.47 08/17/2015   Lab Results  Component Value Date   WBC 5.1 08/17/2015   HGB 12.9 08/17/2015   HCT 39.4 08/17/2015   MCV 93.2 08/17/2015   PLT 193.0 08/17/2015   Lab Results  Component Value Date   NA 137 08/17/2015   K 3.6 08/17/2015   CHLORIDE 105 01/05/2015   CO2 28 08/17/2015   GLUCOSE 101* 08/17/2015   BUN 11 08/17/2015   CREATININE 0.97 08/17/2015   BILITOT 0.3 08/17/2015   ALKPHOS 54 08/17/2015   AST 23 08/17/2015   ALT 14 08/17/2015   PROT 7.2 08/17/2015   ALBUMIN 4.1 08/17/2015   CALCIUM 8.9 08/17/2015   ANIONGAP 9 01/05/2015   EGFR 63* 01/05/2015   GFR 64.23 08/17/2015   Lab Results  Component Value Date   CHOL 172 08/17/2015   Lab Results  Component Value Date   HDL 74.90 08/17/2015   Lab Results  Component Value Date   LDLCALC 87 08/17/2015   Lab Results  Component Value Date   TRIG 51.0 08/17/2015   Lab Results  Component Value Date   CHOLHDL 2 08/17/2015   Lab Results  Component Value Date   HGBA1C 5.3 12/24/2013       Assessment & Plan:   Problem List Items Addressed This Visit    Anxiety state    Good response to Effexor continue the same      Relevant Medications   venlafaxine XR (EFFEXOR-XR) 37.5 MG 24 hr capsule   Estrogen deficiency    Started on Effexor for the flashes and while it has not helped those much she is aware it has helped her manage stress. So  we will continue, may use Clonidine qhs prn      Insomnia w/ sleep apnea    Encouraged good sleep hygiene such as dark, quiet room. No blue/green glowing lights such as computer screens in bedroom. No alcohol or stimulants in evening. Cut down on caffeine as able. Regular exercise is helpful but not just prior to bed time. May continue Sonata prn      PALPITATIONS    Good response to Procardia. No new concerns      Vitamin D deficiency    With normal level on check       Other Visit Diagnoses    Insomnia, persistent    -  Primary    Relevant Medications    zaleplon (SONATA) 10 MG capsule       I have changed Ms. Scotto's venlafaxine XR. I am also having her start on cloNIDine. Additionally, I am having her maintain her Multiple Vitamins-Minerals (CENTRUM SILVER PO), NIFEdipine, Ibuprofen-Diphenhydramine Cit (ADVIL PM PO), diclofenac, ondansetron, tamoxifen, Vitamin B-12, calcium carbonate, and zaleplon.  Meds ordered this encounter  Medications  . Cyanocobalamin (VITAMIN B-12) 2500 MCG SUBL    Sig: Place under the tongue daily.  . calcium carbonate (OSCAL) 1500 (600 Ca) MG TABS tablet    Sig: Take 600 mg of elemental calcium by mouth daily.  . zaleplon (SONATA) 10 MG capsule    Sig: Take 1 capsule (10 mg total) by mouth at bedtime as needed for sleep.    Dispense:  30 capsule    Refill:  5  . cloNIDine (CATAPRES) 0.1 MG tablet    Sig: Take 1 tablet (0.1 mg total) by  mouth at bedtime as needed.    Dispense:  30 tablet    Refill:  2  . venlafaxine XR (EFFEXOR-XR) 37.5 MG 24 hr capsule    Sig: Take 1 capsule (37.5 mg total) by mouth at bedtime.    Dispense:  30 capsule    Refill:  5     Penni Homans, MD

## 2015-08-24 NOTE — Progress Notes (Signed)
Pre visit review using our clinic review tool, if applicable. No additional management support is needed unless otherwise documented below in the visit note. 

## 2015-08-30 NOTE — Assessment & Plan Note (Signed)
Encouraged good sleep hygiene such as dark, quiet room. No blue/green glowing lights such as computer screens in bedroom. No alcohol or stimulants in evening. Cut down on caffeine as able. Regular exercise is helpful but not just prior to bed time. May continue Sonata prn

## 2015-08-30 NOTE — Assessment & Plan Note (Signed)
With normal level on check

## 2015-08-30 NOTE — Assessment & Plan Note (Addendum)
Started on Effexor for the flashes and while it has not helped those much she is aware it has helped her manage stress. So we will continue, may use Clonidine qhs prn

## 2015-08-30 NOTE — Assessment & Plan Note (Signed)
Good response to Procardia. No new concerns

## 2015-08-30 NOTE — Assessment & Plan Note (Signed)
Good response to Effexor continue the same

## 2015-09-11 MED FILL — TAMOXIFEN 10 MG TABLET: 10 | 30 days supply | Qty: 60 | Fill #2

## 2015-09-23 MED FILL — VENLAFAXINE HCL ER 37.5 MG: 37.5 | 30 days supply | Qty: 30 | Fill #0

## 2015-09-23 MED FILL — cloNIDine HCL 0.1 MG TABS: 0.1 | 30 days supply | Qty: 30 | Fill #1

## 2015-09-23 MED FILL — ZALEPLON 10 MG CAPSULE: 10 | 30 days supply | Qty: 30 | Fill #0

## 2015-10-19 MED FILL — VENLAFAXINE HCL ER 37.5 MG: 37.5 | 30 days supply | Qty: 30 | Fill #1 | Status: TO

## 2015-10-19 MED FILL — TAMOXIFEN 10 MG TABLET: 10 | 30 days supply | Qty: 60 | Fill #3 | Status: TO

## 2015-10-21 MED FILL — ZALEPLON 10 MG CAPSULE: 10 | 30 days supply | Qty: 30 | Fill #1 | Status: TO

## 2015-11-23 MED FILL — TAMOXIFEN 10 MG TABLET: 10 | 30 days supply | Qty: 60 | Fill #0

## 2015-11-23 MED FILL — ZALEPLON 10 MG CAPSULE: 10 | 30 days supply | Qty: 30 | Fill #0

## 2015-11-23 MED FILL — VENLAFAXINE HCL ER 37.5 MG: 37.5 | 30 days supply | Qty: 30 | Fill #0

## 2015-11-24 ENCOUNTER — Telehealth: Payer: Self-pay | Admitting: Family Medicine

## 2015-11-24 ENCOUNTER — Ambulatory Visit: Payer: 59 | Admitting: Family Medicine

## 2015-12-07 NOTE — Telephone Encounter (Signed)
No charge. 

## 2015-12-07 NOTE — Telephone Encounter (Signed)
Pt was no show 11/24/15 for follow up appt, pt has not rescheduled, 1st no show, several cancellations, charge or no charge?

## 2015-12-10 ENCOUNTER — Telehealth: Payer: Self-pay | Admitting: *Deleted

## 2015-12-10 NOTE — Telephone Encounter (Signed)
Received completed form from Dr. Charlett Blake. Called and informed pt that form is ready for pick up at our front desk. Copy sent for scanning. JG/CMA

## 2015-12-22 MED FILL — VENLAFAXINE HCL ER 37.5 MG: 37.5 | 30 days supply | Qty: 30 | Fill #1

## 2015-12-22 MED FILL — ZALEPLON 10 MG CAPSULE: 10 | 30 days supply | Qty: 30 | Fill #1

## 2015-12-22 MED FILL — TAMOXIFEN 10 MG TABLET: 10 | 30 days supply | Qty: 60 | Fill #1

## 2015-12-31 ENCOUNTER — Telehealth: Payer: Self-pay | Admitting: Oncology

## 2015-12-31 NOTE — Telephone Encounter (Signed)
Lvm advising appt chgd from 6/22 to 7/13 @ 3pm due to md pal.

## 2016-01-07 ENCOUNTER — Ambulatory Visit: Payer: 59 | Admitting: Oncology

## 2016-01-25 MED FILL — ZALEPLON 10 MG CAPSULE: 10 | 30 days supply | Qty: 30 | Fill #2

## 2016-01-25 MED FILL — VENLAFAXINE HCL ER 37.5 MG: 37.5 | 30 days supply | Qty: 30 | Fill #2

## 2016-01-27 ENCOUNTER — Other Ambulatory Visit: Payer: Self-pay | Admitting: Oncology

## 2016-01-28 ENCOUNTER — Encounter: Payer: Self-pay | Admitting: Oncology

## 2016-01-28 ENCOUNTER — Ambulatory Visit (HOSPITAL_BASED_OUTPATIENT_CLINIC_OR_DEPARTMENT_OTHER): Payer: Private Health Insurance - Indemnity | Admitting: Oncology

## 2016-01-28 ENCOUNTER — Other Ambulatory Visit: Payer: Self-pay | Admitting: Oncology

## 2016-01-28 VITALS — BP 137/81 | HR 75 | Temp 98.4°F | Resp 18 | Ht 66.0 in | Wt 145.2 lb

## 2016-01-28 DIAGNOSIS — G4700 Insomnia, unspecified: Secondary | ICD-10-CM | POA: Diagnosis not present

## 2016-01-28 DIAGNOSIS — R922 Inconclusive mammogram: Secondary | ICD-10-CM

## 2016-01-28 DIAGNOSIS — N951 Menopausal and female climacteric states: Secondary | ICD-10-CM | POA: Diagnosis not present

## 2016-01-28 DIAGNOSIS — R55 Syncope and collapse: Secondary | ICD-10-CM

## 2016-01-28 DIAGNOSIS — E2839 Other primary ovarian failure: Secondary | ICD-10-CM

## 2016-01-28 DIAGNOSIS — Z1231 Encounter for screening mammogram for malignant neoplasm of breast: Secondary | ICD-10-CM

## 2016-01-28 DIAGNOSIS — N6092 Unspecified benign mammary dysplasia of left breast: Secondary | ICD-10-CM

## 2016-01-28 DIAGNOSIS — N62 Hypertrophy of breast: Secondary | ICD-10-CM | POA: Diagnosis not present

## 2016-01-28 NOTE — Progress Notes (Signed)
OFFICE PROGRESS NOTE   January 28, 2016   Physicians: Willette Alma, Aleen Sells); C.Dohmeier, Freda Munro, R.Buccini  INTERVAL HISTORY:  Patient is seen, alone for visit, in continuing attention to tamoxifen being used in preventative attempt due to high risk history of atypical lobular hyperplasia of left breast diagnosed 10-2013.  She has been on tamoxifen since 11-2013. Last bilateral tomo mammograms were at [facility] 06-15-15, heterogeneously dense breast tissue without other mammographic findings of concern.    Katherine [name] has had severe hot flashes despite multiple medications during all of the time that she has been on tamoxifen. She has longstanding problems with insomnia, which has been worse during all of this time with the hot flashes. Read Drivers has been most helpful, however she generally sleeps no more than 4-5 hours/ night. She is chronically exhausted from lack of sleep.  She has no new or different symptoms or concerns, including no changes in breasts on self exam, no respiratory, GI, or musculoskeletal complaints, no bleeding, no symptoms of blood clots.  Remainder of 10 point Review of Systems negative.   Genetics testing normal 12-2013  She has changed jobs, now Teaching laboratory technician. Son Jeneen Rinks will be high school senior.  ONCOLOGIC HISTORY Patient had bilateral screening mammograms at [facility] 02-15-2013 with bilateral calcifications, followed up with bilateral diagnostic mammograms at [facility] 03-05-2013. Breast tissue is heterogeneously dense. She had 6 month follow up on 09-06-2013 (that report actually dated 10-29-13 in EMR) with slight increase in left breast calcifications and stable right breast calcifications thought likely benign; patient requested biopsy of the left breast rather than mammographic follow up. Left stereotactic needle biopsy 09-23-2013 (~11:00 , 7cm from nipple) found atypical lobular hyperplasia ([id number]). She was referred to Dr  Donne Hazel, with left radioactive seed guided excisional lumpectomy on 10-21-2013. Pathology [id number]) found focal atypical lobular hyperplasia adjacent to the biopsy site, not involving margins. Specimen size was 5.2 x 4 x 1 to 2.2 cm and specimen radiograph confirmed seed and biopsy clip placement. She had local hematoma postoperatively, which is progressively improving. She did not have MRI.She began tamoxifen in preventative attempt 12-15-13   Objective:  Vital signs in last 24 hours: Weight up 1 lb to 143, BMI 23.5, 137/81, HR 75 regular, resp 18, 98.4, 100%  Alert, oriented and appropriate, excellent historian, very pleasant as always. Ambulatory without difficulty.  Hot flashes during exam.  HEENT:PERRL, sclerae not icteric. Oral mucosa moist without lesions, posterior pharynx clear.  Neck supple. No JVD.  Lymphatics:no cervical,supraclavicular, axillary adenopathy Resp: clear to auscultation bilaterally and normal percussion bilaterally Cardio: regular rate and rhythm. No gallop. GI: soft, nontender, not distended, no mass or organomegaly. Normally active bowel sounds. Musculoskeletal/ Extremities: without pitting edema, cords, tenderness Neuro: nonfocal  PSYCH  Appropriate mood and affect Skin without rash, ecchymosis, petechiae Breasts: Left lumpectomy site not remarkable, otherwise bilaterally without dominant mass, skin or nipple findings. Axillae benign.   Lab Results:  Results for orders placed or performed in visit on 08/17/15  CBC  Result Value Ref Range   WBC 5.1 4.0 - 10.5 K/uL   RBC 4.23 3.87 - 5.11 Mil/uL   Platelets 193.0 150.0 - 400.0 K/uL   Hemoglobin 12.9 12.0 - 15.0 g/dL   HCT 39.4 36.0 - 46.0 %   MCV 93.2 78.0 - 100.0 fl   MCHC 32.8 30.0 - 36.0 g/dL   RDW 12.7 11.5 - 15.5 %  Comp Met (CMET)  Result Value Ref Range   Sodium  137 135 - 145 mEq/L   Potassium 3.6 3.5 - 5.1 mEq/L   Chloride 101 96 - 112 mEq/L   CO2 28 19 - 32 mEq/L   Glucose, Bld 101 (H)  70 - 99 mg/dL   BUN 11 6 - 23 mg/dL   Creatinine, Ser 0.97 0.40 - 1.20 mg/dL   Total Bilirubin 0.3 0.2 - 1.2 mg/dL   Alkaline Phosphatase 54 39 - 117 U/L   AST 23 0 - 37 U/L   ALT 14 0 - 35 U/L   Total Protein 7.2 6.0 - 8.3 g/dL   Albumin 4.1 3.5 - 5.2 g/dL   Calcium 8.9 8.4 - 10.5 mg/dL   GFR 64.23 >60.00 mL/min  TSH  Result Value Ref Range   TSH 1.47 0.35 - 4.50 uIU/mL  Lipid Profile  Result Value Ref Range   Cholesterol 172 0 - 200 mg/dL   Triglycerides 51.0 0.0 - 149.0 mg/dL   HDL 74.90 >39.00 mg/dL   VLDL 10.2 0.0 - 40.0 mg/dL   LDL Cholesterol 87 0 - 99 mg/dL   Total CHOL/HDL Ratio 2    NonHDL 96.90   Vitamin D (25 hydroxy)  Result Value Ref Range   VITD 41.86 30.00 - 100.00 ng/mL     Studies/Results:  No results found.  Due mamograms at [facility] 05-2016, tomo medically appropriate with dense breast tissue   Medications: I have reviewed the patient's current medications. She will hold tamoxifen at least next 2 months.  DISCUSSION Hot flashes and sleep problems are really not tolerable, despite all of her efforts and interventions attempted. She will hold tamoxifen at least for next 2 months, and may decide then to change to observation if this makes a significant difference. She will call this office to update in ~ 2 months.   Assessment/Plan: 1.atypical lobular hyperplasia left breast diagnosed 09-23-13 and post lumpectomy with clear margins 10-21-13: Tamoxifen begun 12-15-2013 in preventative attempt, but will hold now for at least 2 months due to intolerable hot flashes and sleep disturbance. Up to date on gyn exams. Next bilateral mammograms 05-2016. I will see her in 6 months or sooner if needed. 2.calcifications in right breast stable and appear benign: radiologists recommend year follow up  3.chronic insomnia, worse with exacerbation of hot flashes by tamoxifen. 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 10.up to date on colonoscopy, done by Dr Cristina Gong summer 2016, next in 10 years    All questions answered. Time spent 25 min including >50% counseling and coordination of care. [place]outh Dr Charlett Blake, Cc Dr Sunday Corn, MD   01/28/2016, 3:09 PM

## 2016-01-30 DIAGNOSIS — R922 Inconclusive mammogram: Secondary | ICD-10-CM | POA: Insufficient documentation

## 2016-01-30 DIAGNOSIS — G4700 Insomnia, unspecified: Secondary | ICD-10-CM | POA: Insufficient documentation

## 2016-02-10 ENCOUNTER — Ambulatory Visit (INDEPENDENT_AMBULATORY_CARE_PROVIDER_SITE_OTHER): Payer: Private Health Insurance - Indemnity | Admitting: Medical

## 2016-02-10 ENCOUNTER — Ambulatory Visit: Payer: Private Health Insurance - Indemnity | Admitting: Physician Assistant

## 2016-02-10 ENCOUNTER — Encounter: Payer: Self-pay | Admitting: Medical

## 2016-02-10 VITALS — BP 124/78 | HR 78 | Temp 98.1°F | Ht 66.0 in | Wt 145.4 lb

## 2016-02-10 DIAGNOSIS — R319 Hematuria, unspecified: Secondary | ICD-10-CM | POA: Diagnosis not present

## 2016-02-10 DIAGNOSIS — R300 Dysuria: Secondary | ICD-10-CM | POA: Diagnosis not present

## 2016-02-10 DIAGNOSIS — R1031 Right lower quadrant pain: Secondary | ICD-10-CM | POA: Diagnosis not present

## 2016-02-10 LAB — POC URINALSYSI DIPSTICK (AUTOMATED)
BILIRUBIN UA: NEGATIVE
Glucose, UA: NEGATIVE
KETONES UA: NEGATIVE
LEUKOCYTES UA: NEGATIVE
Nitrite, UA: NEGATIVE
PH UA: 6
PROTEIN UA: NEGATIVE
Spec Grav, UA: 1.015
Urobilinogen, UA: 1

## 2016-02-10 MED ORDER — CIPROFLOXACIN HCL 500 MG PO TABS: 500.0000 mg | ORAL_TABLET | Freq: Two times a day (BID) | ORAL | 0 refills | Status: DC

## 2016-02-10 MED FILL — CIPROFLOXACIN HCL 500 MG TA: 500 | 7 days supply | Qty: 14 | Fill #0

## 2016-02-10 NOTE — Patient Instructions (Signed)
You most likely have uti. I will put you on cipro pending urine culture.   I would also like to do cbc tomorrow in am. You have some RLQ pain on exam. Want to follow this and if worse may consider imaging to evaluate appendix.  We will notify you on your culture and cbc when results are in.  Follow up in 7 days or as needed

## 2016-02-10 NOTE — Progress Notes (Signed)
Pre visit review using our clinic review tool, if applicable. No additional management support is needed unless otherwise documented below in the visit note. 

## 2016-02-10 NOTE — Progress Notes (Signed)
Subjective:    Patient ID: Katherine [name], female    DOB: August 25, 1965, 50 y.o.   MRN: [id number]  HPI  Pt in for possible UTI.  Pt states yesterday fatigue. Today some urgent and frequent urination. So faint discomfort urinating.   Pt in today reporting urinary symptoms.  Dysuria- yes Frequent urination-yes Hesitancy- Suprapubic pressure-some Fever-none chills-occasional. Nausea-no Vomiting-no CVA pain- mild rt side tender History of UTI- Pt states last uti about 3 years.  Gross hematuria-none Cloudy appearance.  Pt has faint ha. But can't remember if she every had faint ha associated with. No associated neurologic type signs or symptoms on review.  Some joint pain but has some arthritis.   Review of Systems  Constitutional: Positive for fatigue and fever. Negative for chills.       Pt has hot flashes though.  Subjective fever.  Cardiovascular: Negative for chest pain and palpitations.  Gastrointestinal: Negative for abdominal distention, blood in stool, constipation, diarrhea, nausea and vomiting.       Decreased appetite.  Faint suprapubic.   Genitourinary: Positive for dysuria, frequency and urgency.  Musculoskeletal: Positive for back pain.  Skin: Negative for rash.  Neurological: Positive for headaches. Negative for dizziness, syncope, facial asymmetry, weakness and numbness.       Faint mild ha.  Hematological: Negative for adenopathy. Does not bruise/bleed easily.   Past Medical History:  Diagnosis Date  . Atypical lobular hyperplasia of left breast   . Heart murmur    ECHO 2012-trivial regurg-all else normal  . History of chicken pox   . Insomnia   . 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 Insomnia, persistent 08/28/2013   Menopausal induced insomnia, early morning awakenings, and snoring.   . Plantar fasciitis   . PONV (postoperative nausea and vomiting)   . Pure hypercholesterolemia   . Raynaud's syndrome        Social History   Social History  . Marital status: Married    Spouse name: [name]  . Number of children: 1  . Years of education: BSN   Occupational History  . Palliative Care Nurse  [employer]   Social History Main Topics  . Smoking status: Former Smoker    Quit date: 10/16/1991  . Smokeless tobacco: Never Used     Comment: smoked 1981-1993, up to 1 ppd  . Alcohol use Yes     Comment: occasionally   . Drug use: No  . 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 Not on file   Social History Narrative   [name]).   Patient has one child.   Patient is employed at [employer]   Patient has a Copywriter, advertising.   Patient drinks two cups of coffee every morning.   Regular exercise- Cory Roughen Do             Past Surgical History:  Procedure Laterality Date  . BREAST SURGERY  11/20/13   left breast; Dr Donne Hazel  . CESAREAN SECTION  1999  . plantar fasciitis surgery  2011, 2007   both feet  . TONSILLECTOMY AND ADENOIDECTOMY  1981  . uterine fibroidectomy     @ c section    Family History  Problem Relation Age of Onset  . Adopted: Yes  .  Heart attack Maternal Uncle 37  . Heart disease Maternal Uncle 37    MI  . Cervical cancer Maternal Grandmother   . Heart Problems Maternal Grandmother     arrhythmia  . Cancer Maternal Grandmother     gyn  . Stroke Mother     in 45s  . Diabetes Mother   . Heart disease Mother   . Hyperlipidemia Mother   . Hyperlipidemia Brother   . Depression Brother     1/2 brother  . Hypertension    . Cancer Maternal Aunt     numerous breast cancers  . Hypertension Paternal Grandmother   . Hypertension Paternal Grandfather     Allergies  Allergen Reactions  . Ultram [Tramadol] Nausea And Vomiting  . Hydrocodone-Acetaminophen     REACTION: NAUSEA  .  Oxycodone-Acetaminophen     REACTION: NAUSEA    Current Outpatient Prescriptions on File Prior to Visit  Medication Sig Dispense Refill  . calcium carbonate (OSCAL) 1500 (600 Ca) MG TABS tablet Take 600 mg of elemental calcium by mouth daily.    . Cyanocobalamin (VITAMIN B-12) 2500 MCG SUBL Place under the tongue daily.    Marland Kitchen ibuprofen (ADVIL,MOTRIN) 200 MG tablet Take 400 mg by mouth every 6 (six) hours as needed.    . Ibuprofen-Diphenhydramine Cit (ADVIL PM PO) Take by mouth at bedtime as needed.     . Multiple Vitamins-Minerals (CENTRUM SILVER PO) Take by mouth daily.    . Probiotic Product (PROBIOTIC DAILY PO) Take by mouth.    . venlafaxine XR (EFFEXOR-XR) 37.5 MG 24 hr capsule Take 1 capsule (37.5 mg total) by mouth at bedtime. 30 capsule 5  . zaleplon (SONATA) 10 MG capsule Take 1 capsule (10 mg total) by mouth at bedtime as needed for sleep. 30 capsule 5   No current facility-administered medications on file prior to visit.     BP 124/78 (BP Location: Left Arm, Patient Position: Sitting, Cuff Size: Normal)   Pulse 78   Temp 98.1 F (36.7 C) (Oral)   Ht 5\' 6"  (1.676 m)   Wt 145 lb 6.4 oz (66 kg)   SpO2 98%   BMI 23.47 kg/m       Objective:   Physical Exam  General Appearance- Not in acute distress.  HEENT Eyes- Scleraeral/Conjuntiva-bilat- Not Yellow. Mouth & Throat- Normal.  Chest and Lung Exam Auscultation: Breath sounds:-Normal. Adventitious sounds:- No Adventitious sounds.  Cardiovascular Auscultation:Rythm - Regular. Heart Sounds -Normal heart sounds.  Abdomen Inspection:-Inspection Normal.  Palpation/Perucssion: Palpation and Percussion of the abdomen reveal- mild faint suprapbic Tender(but also faint rt lower quadrant pain) no heal jar pain, No Rebound tenderness, No rigidity(Guarding) and No Palpable abdominal masses.  Liver:-Normal.  Spleen:- Normal.   Back- no cva tenderness        Assessment & Plan:  You most likely have uti. I will put you  on cipro pending urine culture.   I would also like to do cbc tomorrow in am. You have some RLQ pain on exam. Want to follow this and if worse may consider imaging to evaluate appendix.  We will notify you on your culture and cbc when results are in.  Follow up in 7 days or as needed   Nykole Matos, Percell Miller, [employer]

## 2016-02-11 ENCOUNTER — Other Ambulatory Visit (INDEPENDENT_AMBULATORY_CARE_PROVIDER_SITE_OTHER): Payer: Private Health Insurance - Indemnity

## 2016-02-11 DIAGNOSIS — R1031 Right lower quadrant pain: Secondary | ICD-10-CM

## 2016-02-11 LAB — CBC WITH DIFFERENTIAL/PLATELET
BASOS PCT: 0.4 % (ref 0.0–3.0)
Basophils Absolute: 0 10*3/uL (ref 0.0–0.1)
EOS PCT: 1.3 % (ref 0.0–5.0)
Eosinophils Absolute: 0.1 10*3/uL (ref 0.0–0.7)
HCT: 38.8 % (ref 36.0–46.0)
Hemoglobin: 13.2 g/dL (ref 12.0–15.0)
LYMPHS ABS: 1.2 10*3/uL (ref 0.7–4.0)
Lymphocytes Relative: 21.2 % (ref 12.0–46.0)
MCHC: 33.9 g/dL (ref 30.0–36.0)
MCV: 91.8 fl (ref 78.0–100.0)
MONOS PCT: 13.9 % — AB (ref 3.0–12.0)
Monocytes Absolute: 0.8 10*3/uL (ref 0.1–1.0)
NEUTROS ABS: 3.6 10*3/uL (ref 1.4–7.7)
NEUTROS PCT: 63.2 % (ref 43.0–77.0)
PLATELETS: 193 10*3/uL (ref 150.0–400.0)
RBC: 4.23 Mil/uL (ref 3.87–5.11)
RDW: 12.4 % (ref 11.5–15.5)
WBC: 5.6 10*3/uL (ref 4.0–10.5)

## 2016-02-12 ENCOUNTER — Telehealth: Payer: Self-pay

## 2016-02-12 NOTE — Progress Notes (Signed)
Pt has seen results on MyChart and message also sent for patient to call back if any questions.

## 2016-02-12 NOTE — Telephone Encounter (Signed)
-----   Message from Mackie Pai, PA-C sent at 02/11/2016  8:29 PM EDT ----- WBC is not elevated. This is reassuring but does not exclude appendicitis. But doubt presently. If any increase in Rt lower quadrant region let us know. Same advisement as before.

## 2016-02-13 LAB — URINE CULTURE

## 2016-02-15 NOTE — Progress Notes (Signed)
Pt has seen results on MyChart and message also sent for patient to call back if any questions.

## 2016-02-16 ENCOUNTER — Encounter: Payer: Self-pay | Admitting: Medical

## 2016-02-17 NOTE — Telephone Encounter (Addendum)
Katherine [name] is out of the office this week.  Message routed to covering provider.    Please advise.

## 2016-02-22 ENCOUNTER — Other Ambulatory Visit: Payer: Self-pay | Admitting: Family Medicine

## 2016-02-22 DIAGNOSIS — G4700 Insomnia, unspecified: Secondary | ICD-10-CM

## 2016-02-22 MED FILL — VENLAFAXINE HCL ER 37.5 MG: 37.5 | 30 days supply | Qty: 30 | Fill #3

## 2016-02-22 NOTE — Telephone Encounter (Signed)
Last refill on 08/24/2015  #30 with 5 refills Last Office visit 08/24/2015 No Contract/or UDS

## 2016-02-23 ENCOUNTER — Other Ambulatory Visit: Payer: Self-pay | Admitting: Emergency Medicine

## 2016-02-23 DIAGNOSIS — G4700 Insomnia, unspecified: Secondary | ICD-10-CM

## 2016-02-23 MED ORDER — ZALEPLON 10 MG PO CAPS
10.0000 mg | ORAL_CAPSULE | Freq: Every evening | ORAL | 1 refills | Status: DC | PRN
Start: 2016-02-23 — End: 2016-04-14

## 2016-02-23 MED FILL — ZALEPLON 10 MG CAPSULE: 10 | 30 days supply | Qty: 30 | Fill #0

## 2016-02-23 NOTE — Telephone Encounter (Signed)
Rx sent to [facility] for Zaleplon (Sonata) 10 MG capsule.

## 2016-03-08 ENCOUNTER — Ambulatory Visit (INDEPENDENT_AMBULATORY_CARE_PROVIDER_SITE_OTHER): Payer: Private Health Insurance - Indemnity | Admitting: Family Medicine

## 2016-03-08 ENCOUNTER — Encounter: Payer: Self-pay | Admitting: Family Medicine

## 2016-03-08 VITALS — BP 104/72 | HR 66 | Temp 98.5°F | Resp 16 | Ht 65.0 in | Wt 146.8 lb

## 2016-03-08 DIAGNOSIS — E782 Mixed hyperlipidemia: Secondary | ICD-10-CM | POA: Diagnosis not present

## 2016-03-08 DIAGNOSIS — N390 Urinary tract infection, site not specified: Secondary | ICD-10-CM

## 2016-03-08 DIAGNOSIS — E559 Vitamin D deficiency, unspecified: Secondary | ICD-10-CM

## 2016-03-08 DIAGNOSIS — R002 Palpitations: Secondary | ICD-10-CM

## 2016-03-08 DIAGNOSIS — Z0000 Encounter for general adult medical examination without abnormal findings: Secondary | ICD-10-CM | POA: Diagnosis not present

## 2016-03-08 DIAGNOSIS — G4730 Sleep apnea, unspecified: Secondary | ICD-10-CM

## 2016-03-08 DIAGNOSIS — G4700 Insomnia, unspecified: Secondary | ICD-10-CM

## 2016-03-08 DIAGNOSIS — R7309 Other abnormal glucose: Secondary | ICD-10-CM

## 2016-03-08 DIAGNOSIS — Z23 Encounter for immunization: Secondary | ICD-10-CM | POA: Diagnosis not present

## 2016-03-08 DIAGNOSIS — R011 Cardiac murmur, unspecified: Secondary | ICD-10-CM | POA: Diagnosis not present

## 2016-03-08 DIAGNOSIS — E7800 Pure hypercholesterolemia, unspecified: Secondary | ICD-10-CM

## 2016-03-08 NOTE — Patient Instructions (Addendum)
Vitamin D 2000 IU cap daily Probiotics daily  Preventive Care for Adults, Female A healthy lifestyle and preventive care can promote health and wellness. Preventive health guidelines for women include the following key practices.  A routine yearly physical is a good way to check with your health care provider about your health and preventive screening. It is a chance to share any concerns and updates on your health and to receive a thorough exam.  Visit your dentist for a routine exam and preventive care every 6 months. Brush your teeth twice a day and floss once a day. Good oral hygiene prevents tooth decay and gum disease.  The frequency of eye exams is based on your age, health, family medical history, use of contact lenses, and other factors. Follow your health care provider's recommendations for frequency of eye exams.  Eat a healthy diet. Foods like vegetables, fruits, whole grains, low-fat dairy products, and lean protein foods contain the nutrients you need without too many calories. Decrease your intake of foods high in solid fats, added sugars, and salt. Eat the right amount of calories for you.Get information about a proper diet from your health care provider, if necessary.  Regular physical exercise is one of the most important things you can do for your health. Most adults should get at least 150 minutes of moderate-intensity exercise (any activity that increases your heart rate and causes you to sweat) each week. In addition, most adults need muscle-strengthening exercises on 2 or more days a week.  Maintain a healthy weight. The body mass index (BMI) is a screening tool to identify possible weight problems. It provides an estimate of body fat based on height and weight. Your health care provider can find your BMI and can help you achieve or maintain a healthy weight.For adults 20 years and older:  A BMI below 18.5 is considered underweight.  A BMI of 18.5 to 24.9 is normal.  A  BMI of 25 to 29.9 is considered overweight.  A BMI of 30 and above is considered obese.  Maintain normal blood lipids and cholesterol levels by exercising and minimizing your intake of saturated fat. Eat a balanced diet with plenty of fruit and vegetables. Blood tests for lipids and cholesterol should begin at age 10 and be repeated every 5 years. If your lipid or cholesterol levels are high, you are over 50, or you are at high risk for heart disease, you may need your cholesterol levels checked more frequently.Ongoing high lipid and cholesterol levels should be treated with medicines if diet and exercise are not working.  If you smoke, find out from your health care provider how to quit. If you do not use tobacco, do not start.  Lung cancer screening is recommended for adults aged 52-80 years who are at high risk for developing lung cancer because of a history of smoking. A yearly low-dose CT scan of the lungs is recommended for people who have at least a 30-pack-year history of smoking and are a current smoker or have quit within the past 15 years. A pack year of smoking is smoking an average of 1 pack of cigarettes a day for 1 year (for example: 1 pack a day for 30 years or 2 packs a day for 15 years). Yearly screening should continue until the smoker has stopped smoking for at least 15 years. Yearly screening should be stopped for people who develop a health problem that would prevent them from having lung cancer treatment.  If you  are pregnant, do not drink alcohol. If you are breastfeeding, be very cautious about drinking alcohol. If you are not pregnant and choose to drink alcohol, do not have more than 1 drink per day. One drink is considered to be 12 ounces (355 mL) of beer, 5 ounces (148 mL) of [place], or 1.5 ounces (44 mL) of liquor.  Avoid use of street drugs. Do not share needles with anyone. Ask for help if you need support or instructions about stopping the use of drugs.  High blood  pressure causes heart disease and increases the risk of stroke. Your blood pressure should be checked at least every 1 to 2 years. Ongoing high blood pressure should be treated with medicines if weight loss and exercise do not work.  If you are 52-28 years old, ask your health care provider if you should take aspirin to prevent strokes.  Diabetes screening is done by taking a blood sample to check your blood glucose level after you have not eaten for a certain period of time (fasting). If you are not overweight and you do not have risk factors for diabetes, you should be screened once every 3 years starting at age 15. If you are overweight or obese and you are 66-48 years of age, you should be screened for diabetes every year as part of your cardiovascular risk assessment.  Breast cancer screening is essential preventive care for women. You should practice "breast self-awareness." This means understanding the normal appearance and feel of your breasts and may include breast self-examination. Any changes detected, no matter how small, should be reported to a health care provider. Women in their 29s and 30s should have a clinical breast exam (CBE) by a health care provider as part of a regular health exam every 1 to 3 years. After age 41, women should have a CBE every year. Starting at age 64, women should consider having a mammogram (breast X-ray test) every year. Women who have a family history of breast cancer should talk to their health care provider about genetic screening. Women at a high risk of breast cancer should talk to their health care providers about having an MRI and a mammogram every year.  Breast cancer gene (BRCA)-related cancer risk assessment is recommended for women who have family members with BRCA-related cancers. BRCA-related cancers include breast, ovarian, tubal, and peritoneal cancers. Having family members with these cancers may be associated with an increased risk for harmful  changes (mutations) in the breast cancer genes BRCA1 and BRCA2. Results of the assessment will determine the need for genetic counseling and BRCA1 and BRCA2 testing.  Your health care provider may recommend that you be screened regularly for cancer of the pelvic organs (ovaries, uterus, and vagina). This screening involves a pelvic examination, including checking for microscopic changes to the surface of your cervix (Pap test). You may be encouraged to have this screening done every 3 years, beginning at age 63.  For women ages 82-65, health care providers may recommend pelvic exams and Pap testing every 3 years, or they may recommend the Pap and pelvic exam, combined with testing for human papilloma virus (HPV), every 5 years. Some types of HPV increase your risk of cervical cancer. Testing for HPV may also be done on women of any age with unclear Pap test results.  Other health care providers may not recommend any screening for nonpregnant women who are considered low risk for pelvic cancer and who do not have symptoms. Ask your health care  provider if a screening pelvic exam is right for you.  If you have had past treatment for cervical cancer or a condition that could lead to cancer, you need Pap tests and screening for cancer for at least 20 years after your treatment. If Pap tests have been discontinued, your risk factors (such as having a new sexual partner) need to be reassessed to determine if screening should resume. Some women have medical problems that increase the chance of getting cervical cancer. In these cases, your health care provider may recommend more frequent screening and Pap tests.  Colorectal cancer can be detected and often prevented. Most routine colorectal cancer screening begins at the age of 103 years and continues through age 42 years. However, your health care provider may recommend screening at an earlier age if you have risk factors for colon cancer. On a yearly basis, your  health care provider may provide home test kits to check for hidden blood in the stool. Use of a small camera at the end of a tube, to directly examine the colon (sigmoidoscopy or colonoscopy), can detect the earliest forms of colorectal cancer. Talk to your health care provider about this at age 28, when routine screening begins. Direct exam of the colon should be repeated every 5-10 years through age 60 years, unless early forms of precancerous polyps or small growths are found.  People who are at an increased risk for hepatitis B should be screened for this virus. You are considered at high risk for hepatitis B if:  You were born in a country where hepatitis B occurs often. Talk with your health care provider about which countries are considered high risk.  Your parents were born in a high-risk country and you have not received a shot to protect against hepatitis B (hepatitis B vaccine).  You have HIV or AIDS.  You use needles to inject street drugs.  You live with, or have sex with, someone who has hepatitis B.  You get hemodialysis treatment.  You take certain medicines for conditions like cancer, organ transplantation, and autoimmune conditions.  Hepatitis C blood testing is recommended for all people born from [birth year] through 1965 and any individual with known risks for hepatitis C.  Practice safe sex. Use condoms and avoid high-risk sexual practices to reduce the spread of sexually transmitted infections (STIs). STIs include gonorrhea, chlamydia, syphilis, trichomonas, herpes, HPV, and human immunodeficiency virus (HIV). Herpes, HIV, and HPV are viral illnesses that have no cure. They can result in disability, cancer, and death.  You should be screened for sexually transmitted illnesses (STIs) including gonorrhea and chlamydia if:  You are sexually active and are younger than 24 years.  You are older than 24 years and your health care provider tells you that you are at risk for this  type of infection.  Your sexual activity has changed since you were last screened and you are at an increased risk for chlamydia or gonorrhea. Ask your health care provider if you are at risk.  If you are at risk of being infected with HIV, it is recommended that you take a prescription medicine daily to prevent HIV infection. This is called preexposure prophylaxis (PrEP). You are considered at risk if:  You are sexually active and do not regularly use condoms or know the HIV status of your partner(s).  You take drugs by injection.  You are sexually active with a partner who has HIV.  Talk with your health care provider about whether you are  at high risk of being infected with HIV. If you choose to begin PrEP, you should first be tested for HIV. You should then be tested every 3 months for as long as you are taking PrEP.  Osteoporosis is a disease in which the bones lose minerals and strength with aging. This can result in serious bone fractures or breaks. The risk of osteoporosis can be identified using a bone density scan. Women ages 77 years and over and women at risk for fractures or osteoporosis should discuss screening with their health care providers. Ask your health care provider whether you should take a calcium supplement or vitamin D to reduce the rate of osteoporosis.  Menopause can be associated with physical symptoms and risks. Hormone replacement therapy is available to decrease symptoms and risks. You should talk to your health care provider about whether hormone replacement therapy is right for you.  Use sunscreen. Apply sunscreen liberally and repeatedly throughout the day. You should seek shade when your shadow is shorter than you. Protect yourself by wearing long sleeves, pants, a wide-brimmed hat, and sunglasses year round, whenever you are outdoors.  Once a month, do a whole body skin exam, using a mirror to look at the skin on your back. Tell your health care provider of  new moles, moles that have irregular borders, moles that are larger than a pencil eraser, or moles that have changed in shape or color.  Stay current with required vaccines (immunizations).  Influenza vaccine. All adults should be immunized every year.  Tetanus, diphtheria, and acellular pertussis (Td, Tdap) vaccine. Pregnant women should receive 1 dose of Tdap vaccine during each pregnancy. The dose should be obtained regardless of the length of time since the last dose. Immunization is preferred during the 27th-36th week of gestation. An adult who has not previously received Tdap or who does not know her vaccine status should receive 1 dose of Tdap. This initial dose should be followed by tetanus and diphtheria toxoids (Td) booster doses every 10 years. Adults with an unknown or incomplete history of completing a 3-dose immunization series with Td-containing vaccines should begin or complete a primary immunization series including a Tdap dose. Adults should receive a Td booster every 10 years.  Varicella vaccine. An adult without evidence of immunity to varicella should receive 2 doses or a second dose if she has previously received 1 dose. Pregnant females who do not have evidence of immunity should receive the first dose after pregnancy. This first dose should be obtained before leaving the health care facility. The second dose should be obtained 4-8 weeks after the first dose.  Human papillomavirus (HPV) vaccine. Females aged 13-26 years who have not received the vaccine previously should obtain the 3-dose series. The vaccine is not recommended for use in pregnant females. However, pregnancy testing is not needed before receiving a dose. If a female is found to be pregnant after receiving a dose, no treatment is needed. In that case, the remaining doses should be delayed until after the pregnancy. Immunization is recommended for any person with an immunocompromised condition through the age of 61  years if she did not get any or all doses earlier. During the 3-dose series, the second dose should be obtained 4-8 weeks after the first dose. The third dose should be obtained 24 weeks after the first dose and 16 weeks after the second dose.  Zoster vaccine. One dose is recommended for adults aged 51 years or older unless certain conditions are present.  Measles, mumps, and rubella (MMR) vaccine. Adults born before 75 generally are considered immune to measles and mumps. Adults born in [birth year] or later should have 1 or more doses of MMR vaccine unless there is a contraindication to the vaccine or there is laboratory evidence of immunity to each of the three diseases. A routine second dose of MMR vaccine should be obtained at least 28 days after the first dose for students attending postsecondary schools, health care workers, or international travelers. People who received inactivated measles vaccine or an unknown type of measles vaccine during 1963-1967 should receive 2 doses of MMR vaccine. People who received inactivated mumps vaccine or an unknown type of mumps vaccine before 1979 and are at high risk for mumps infection should consider immunization with 2 doses of MMR vaccine. For females of childbearing age, rubella immunity should be determined. If there is no evidence of immunity, females who are not pregnant should be vaccinated. If there is no evidence of immunity, females who are pregnant should delay immunization until after pregnancy. Unvaccinated health care workers born before [birth year] who lack laboratory evidence of measles, mumps, or rubella immunity or laboratory confirmation of disease should consider measles and mumps immunization with 2 doses of MMR vaccine or rubella immunization with 1 dose of MMR vaccine.  Pneumococcal 13-valent conjugate (PCV13) vaccine. When indicated, a person who is uncertain of his immunization history and has no record of immunization should receive the PCV13 vaccine.  All adults 79 years of age and older should receive this vaccine. An adult aged 68 years or older who has certain medical conditions and has not been previously immunized should receive 1 dose of PCV13 vaccine. This PCV13 should be followed with a dose of pneumococcal polysaccharide (PPSV23) vaccine. Adults who are at high risk for pneumococcal disease should obtain the PPSV23 vaccine at least 8 weeks after the dose of PCV13 vaccine. Adults older than 52 years of age who have normal immune system function should obtain the PPSV23 vaccine dose at least 1 year after the dose of PCV13 vaccine.  Pneumococcal polysaccharide (PPSV23) vaccine. When PCV13 is also indicated, PCV13 should be obtained first. All adults aged 64 years and older should be immunized. An adult younger than age 75 years who has certain medical conditions should be immunized. Any person who resides in a nursing home or long-term care facility should be immunized. An adult smoker should be immunized. People with an immunocompromised condition and certain other conditions should receive both PCV13 and PPSV23 vaccines. People with human immunodeficiency virus (HIV) infection should be immunized as soon as possible after diagnosis. Immunization during chemotherapy or radiation therapy should be avoided. Routine use of PPSV23 vaccine is not recommended for American Indians, [place]atives, or people younger than 65 years unless there are medical conditions that require PPSV23 vaccine. When indicated, people who have unknown immunization and have no record of immunization should receive PPSV23 vaccine. One-time revaccination 5 years after the first dose of PPSV23 is recommended for people aged 19-64 years who have chronic kidney failure, nephrotic syndrome, asplenia, or immunocompromised conditions. People who received 1-2 doses of PPSV23 before age 39 years should receive another dose of PPSV23 vaccine at age 69 years or later if at least 5 years have  passed since the previous dose. Doses of PPSV23 are not needed for people immunized with PPSV23 at or after age 12 years.  Meningococcal vaccine. Adults with asplenia or persistent complement component deficiencies should receive 2 doses of quadrivalent meningococcal  conjugate (MenACWY-D) vaccine. The doses should be obtained at least 2 months apart. Microbiologists working with certain meningococcal bacteria, military recruits, people at risk during an outbreak, and people who travel to or live in countries with a high rate of meningitis should be immunized. A first-year college student up through age 53 years who is living in a residence hall should receive a dose if she did not receive a dose on or after her 16th birthday. Adults who have certain high-risk conditions should receive one or more doses of vaccine.  Hepatitis A vaccine. Adults who wish to be protected from this disease, have certain high-risk conditions, work with hepatitis A-infected animals, work in hepatitis A research labs, or travel to or work in countries with a high rate of hepatitis A should be immunized. Adults who were previously unvaccinated and who anticipate close contact with an international adoptee during the first 60 days after arrival in the [place] from a country with a high rate of hepatitis A should be immunized.  Hepatitis B vaccine. Adults who wish to be protected from this disease, have certain high-risk conditions, may be exposed to blood or other infectious body fluids, are household contacts or sex partners of hepatitis B positive people, are clients or workers in certain care facilities, or travel to or work in countries with a high rate of hepatitis B should be immunized.  Haemophilus influenzae type b (Hib) vaccine. A previously unvaccinated person with asplenia or sickle cell disease or having a scheduled splenectomy should receive 1 dose of Hib vaccine. Regardless of previous immunization, a recipient of  a hematopoietic stem cell transplant should receive a 3-dose series 6-12 months after her successful transplant. Hib vaccine is not recommended for adults with HIV infection. Preventive Services / Frequency Ages 63 to 39 years  Blood pressure check.** / Every 3-5 years.  Lipid and cholesterol check.** / Every 5 years beginning at age 67.  Clinical breast exam.** / Every 3 years for women in their 89s and 30s.  BRCA-related cancer risk assessment.** / For women who have family members with a BRCA-related cancer (breast, ovarian, tubal, or peritoneal cancers).  Pap test.** / Every 2 years from ages 64 through 57. Every 3 years starting at age 66 through age 50 or 26 with a history of 3 consecutive normal Pap tests.  HPV screening.** / Every 3 years from ages 58 through ages 42 to 25 with a history of 3 consecutive normal Pap tests.  Hepatitis C blood test.** / For any individual with known risks for hepatitis C.  Skin self-exam. / Monthly.  Influenza vaccine. / Every year.  Tetanus, diphtheria, and acellular pertussis (Tdap, Td) vaccine.** / Consult your health care provider. Pregnant women should receive 1 dose of Tdap vaccine during each pregnancy. 1 dose of Td every 10 years.  Varicella vaccine.** / Consult your health care provider. Pregnant females who do not have evidence of immunity should receive the first dose after pregnancy.  HPV vaccine. / 3 doses over 6 months, if 26 and younger. The vaccine is not recommended for use in pregnant females. However, pregnancy testing is not needed before receiving a dose.  Measles, mumps, rubella (MMR) vaccine.** / You need at least 1 dose of MMR if you were born in 1957 or later. You may also need a 2nd dose. For females of childbearing age, rubella immunity should be determined. If there is no evidence of immunity, females who are not pregnant should be vaccinated. If there  is no evidence of immunity, females who are pregnant should delay  immunization until after pregnancy.  Pneumococcal 13-valent conjugate (PCV13) vaccine.** / Consult your health care provider.  Pneumococcal polysaccharide (PPSV23) vaccine.** / 1 to 2 doses if you smoke cigarettes or if you have certain conditions.  Meningococcal vaccine.** / 1 dose if you are age 19 to 21 years and a first-year college student living in a residence hall, or have one of several medical conditions, you need to get vaccinated against meningococcal disease. You may also need additional booster doses.  Hepatitis A vaccine.** / Consult your health care provider.  Hepatitis B vaccine.** / Consult your health care provider.  Haemophilus influenzae type b (Hib) vaccine.** / Consult your health care provider. Ages 40 to 64 years  Blood pressure check.** / Every year.  Lipid and cholesterol check.** / Every 5 years beginning at age 20 years.  Lung cancer screening. / Every year if you are aged 55-80 years and have a 30-pack-year history of smoking and currently smoke or have quit within the past 15 years. Yearly screening is stopped once you have quit smoking for at least 15 years or develop a health problem that would prevent you from having lung cancer treatment.  Clinical breast exam.** / Every year after age 40 years.  BRCA-related cancer risk assessment.** / For women who have family members with a BRCA-related cancer (breast, ovarian, tubal, or peritoneal cancers).  Mammogram.** / Every year beginning at age 40 years and continuing for as long as you are in good health. Consult with your health care provider.  Pap test.** / Every 3 years starting at age 30 years through age 65 or 70 years with a history of 3 consecutive normal Pap tests.  HPV screening.** / Every 3 years from ages 30 years through ages 65 to 70 years with a history of 3 consecutive normal Pap tests.  Fecal occult blood test (FOBT) of stool. / Every year beginning at age 50 years and continuing until age  75 years. You may not need to do this test if you get a colonoscopy every 10 years.  Flexible sigmoidoscopy or colonoscopy.** / Every 5 years for a flexible sigmoidoscopy or every 10 years for a colonoscopy beginning at age 50 years and continuing until age 75 years.  Hepatitis C blood test.** / For all people born from 1945 through 1965 and any individual with known risks for hepatitis C.  Skin self-exam. / Monthly.  Influenza vaccine. / Every year.  Tetanus, diphtheria, and acellular pertussis (Tdap/Td) vaccine.** / Consult your health care provider. Pregnant women should receive 1 dose of Tdap vaccine during each pregnancy. 1 dose of Td every 10 years.  Varicella vaccine.** / Consult your health care provider. Pregnant females who do not have evidence of immunity should receive the first dose after pregnancy.  Zoster vaccine.** / 1 dose for adults aged 60 years or older.  Measles, mumps, rubella (MMR) vaccine.** / You need at least 1 dose of MMR if you were born in 1957 or later. You may also need a second dose. For females of childbearing age, rubella immunity should be determined. If there is no evidence of immunity, females who are not pregnant should be vaccinated. If there is no evidence of immunity, females who are pregnant should delay immunization until after pregnancy.  Pneumococcal 13-valent conjugate (PCV13) vaccine.** / Consult your health care provider.  Pneumococcal polysaccharide (PPSV23) vaccine.** / 1 to 2 doses if you smoke cigarettes or if   you have certain conditions.  Meningococcal vaccine.** / Consult your health care provider.  Hepatitis A vaccine.** / Consult your health care provider.  Hepatitis B vaccine.** / Consult your health care provider.  Haemophilus influenzae type b (Hib) vaccine.** / Consult your health care provider. Ages 32 years and over  Blood pressure check.** / Every year.  Lipid and cholesterol check.** / Every 5 years beginning at age 31  years.  Lung cancer screening. / Every year if you are aged 39-80 years and have a 30-pack-year history of smoking and currently smoke or have quit within the past 15 years. Yearly screening is stopped once you have quit smoking for at least 15 years or develop a health problem that would prevent you from having lung cancer treatment.  Clinical breast exam.** / Every year after age 79 years.  BRCA-related cancer risk assessment.** / For women who have family members with a BRCA-related cancer (breast, ovarian, tubal, or peritoneal cancers).  Mammogram.** / Every year beginning at age 35 years and continuing for as long as you are in good health. Consult with your health care provider.  Pap test.** / Every 3 years starting at age 11 years through age 35 or 48 years with 3 consecutive normal Pap tests. Testing can be stopped between 65 and 70 years with 3 consecutive normal Pap tests and no abnormal Pap or HPV tests in the past 10 years.  HPV screening.** / Every 3 years from ages 50 years through ages 4 or 70 years with a history of 3 consecutive normal Pap tests. Testing can be stopped between 65 and 70 years with 3 consecutive normal Pap tests and no abnormal Pap or HPV tests in the past 10 years.  Fecal occult blood test (FOBT) of stool. / Every year beginning at age 73 years and continuing until age 30 years. You may not need to do this test if you get a colonoscopy every 10 years.  Flexible sigmoidoscopy or colonoscopy.** / Every 5 years for a flexible sigmoidoscopy or every 10 years for a colonoscopy beginning at age 3 years and continuing until age 68 years.  Hepatitis C blood test.** / For all people born from [birth year] through 1967 and any individual with known risks for hepatitis C.  Osteoporosis screening.** / A one-time screening for women ages 29 years and over and women at risk for fractures or osteoporosis.  Skin self-exam. / Monthly.  Influenza vaccine. / Every year.  Tetanus,  diphtheria, and acellular pertussis (Tdap/Td) vaccine.** / 1 dose of Td every 10 years.  Varicella vaccine.** / Consult your health care provider.  Zoster vaccine.** / 1 dose for adults aged 8 years or older.  Pneumococcal 13-valent conjugate (PCV13) vaccine.** / Consult your health care provider.  Pneumococcal polysaccharide (PPSV23) vaccine.** / 1 dose for all adults aged 55 years and older.  Meningococcal vaccine.** / Consult your health care provider.  Hepatitis A vaccine.** / Consult your health care provider.  Hepatitis B vaccine.** / Consult your health care provider.  Haemophilus influenzae type b (Hib) vaccine.** / Consult your health care provider. ** Family history and personal history of risk and conditions may change your health care provider's recommendations.   This information is not intended to replace advice given to you by your health care provider. Make sure you discuss any questions you have with your health care provider.   Document Released: 08/30/2001 Document Revised: 07/25/2014 Document Reviewed: 11/29/2010 Elsevier Interactive Patient Education [employer].

## 2016-03-08 NOTE — Progress Notes (Signed)
Pre visit review using our clinic review tool, if applicable. No additional management support is needed unless otherwise documented below in the visit note. 

## 2016-03-10 ENCOUNTER — Other Ambulatory Visit (INDEPENDENT_AMBULATORY_CARE_PROVIDER_SITE_OTHER): Payer: Private Health Insurance - Indemnity

## 2016-03-10 DIAGNOSIS — N390 Urinary tract infection, site not specified: Secondary | ICD-10-CM | POA: Diagnosis not present

## 2016-03-10 DIAGNOSIS — E559 Vitamin D deficiency, unspecified: Secondary | ICD-10-CM

## 2016-03-10 DIAGNOSIS — R011 Cardiac murmur, unspecified: Secondary | ICD-10-CM | POA: Diagnosis not present

## 2016-03-10 DIAGNOSIS — Z0000 Encounter for general adult medical examination without abnormal findings: Secondary | ICD-10-CM

## 2016-03-10 DIAGNOSIS — R7309 Other abnormal glucose: Secondary | ICD-10-CM

## 2016-03-10 DIAGNOSIS — R002 Palpitations: Secondary | ICD-10-CM

## 2016-03-10 DIAGNOSIS — E782 Mixed hyperlipidemia: Secondary | ICD-10-CM | POA: Diagnosis not present

## 2016-03-10 LAB — CBC
HCT: 41.1 % (ref 36.0–46.0)
HEMOGLOBIN: 14 g/dL (ref 12.0–15.0)
MCHC: 34.1 g/dL (ref 30.0–36.0)
MCV: 91.2 fl (ref 78.0–100.0)
PLATELETS: 225 10*3/uL (ref 150.0–400.0)
RBC: 4.51 Mil/uL (ref 3.87–5.11)
RDW: 12.7 % (ref 11.5–15.5)
WBC: 5.6 10*3/uL (ref 4.0–10.5)

## 2016-03-10 LAB — LIPID PANEL
CHOL/HDL RATIO: 2
CHOLESTEROL: 192 mg/dL (ref 0–200)
HDL: 79.9 mg/dL (ref >39.00)
LDL CALC: 100 mg/dL — AB (ref 0–99)
NonHDL: 111.83
TRIGLYCERIDES: 60 mg/dL (ref 0.0–149.0)
VLDL: 12 mg/dL (ref 0.0–40.0)

## 2016-03-10 LAB — COMPREHENSIVE METABOLIC PANEL
ALBUMIN: 4.4 g/dL (ref 3.5–5.2)
ALK PHOS: 78 U/L (ref 39–117)
ALT: 43 U/L — AB (ref 0–35)
AST: 51 U/L — ABNORMAL HIGH (ref 0–37)
BILIRUBIN TOTAL: 0.5 mg/dL (ref 0.2–1.2)
BUN: 12 mg/dL (ref 6–23)
CO2: 29 mEq/L (ref 19–32)
CREATININE: 0.93 mg/dL (ref 0.40–1.20)
Calcium: 9 mg/dL (ref 8.4–10.5)
Chloride: 102 mEq/L (ref 96–112)
GFR: 67.27 mL/min (ref >60.00)
GLUCOSE: 97 mg/dL (ref 70–99)
POTASSIUM: 4.2 meq/L (ref 3.5–5.1)
SODIUM: 138 meq/L (ref 135–145)
TOTAL PROTEIN: 7.6 g/dL (ref 6.0–8.3)

## 2016-03-10 LAB — URINALYSIS
Bilirubin Urine: NEGATIVE
Hgb urine dipstick: NEGATIVE
Ketones, ur: NEGATIVE
LEUKOCYTES UA: NEGATIVE
NITRITE: NEGATIVE
PH: 6 (ref 5.0–8.0)
Total Protein, Urine: NEGATIVE
UROBILINOGEN UA: 0.2 (ref 0.0–1.0)
Urine Glucose: NEGATIVE

## 2016-03-10 LAB — TSH: TSH: 1.01 u[IU]/mL (ref 0.35–4.50)

## 2016-03-10 LAB — T4, FREE: FREE T4: 0.74 ng/dL (ref 0.60–1.60)

## 2016-03-10 LAB — VITAMIN D 25 HYDROXY (VIT D DEFICIENCY, FRACTURES): VITD: 45.79 ng/mL (ref 30.00–100.00)

## 2016-03-11 LAB — HEPATITIS C ANTIBODY: HCV AB: NEGATIVE

## 2016-03-12 LAB — URINE CULTURE: Organism ID, Bacteria: NO GROWTH

## 2016-03-21 ENCOUNTER — Encounter: Payer: Self-pay | Admitting: Family Medicine

## 2016-03-21 DIAGNOSIS — Z0000 Encounter for general adult medical examination without abnormal findings: Secondary | ICD-10-CM

## 2016-03-21 HISTORY — DX: Encounter for general adult medical examination without abnormal findings: Z00.00

## 2016-03-21 NOTE — Assessment & Plan Note (Signed)
Encouraged heart healthy diet, increase exercise, avoid trans fats, consider a krill oil cap daily 

## 2016-03-21 NOTE — Progress Notes (Signed)
Patient ID: Katherine [name], female   DOB: 05-Jan-1966, 50 y.o.   MRN: [id number]   Subjective:    Patient ID: Katherine [name], female    DOB: 1966/05/16, 50 y.o.   MRN: [id number]  Chief Complaint  Patient presents with  . Annual Exam    HPI Patient is in today for annual preventative exam. No recent illness or acute concerns. Does note some neck discomfort on left discomfort. No falls or injury. Has noted some stress recently. Denies CP/palp/SOB/HA/congestion/fevers/GI or GU c/o. Taking meds as prescribed Past Medical History:  Diagnosis Date  . Atypical lobular hyperplasia of left breast   . Heart murmur    ECHO 2012-trivial regurg-all else normal  . History of chicken pox   . Insomnia   . 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 Insomnia, persistent 08/28/2013   Menopausal induced insomnia, early morning awakenings, and snoring.   . Plantar fasciitis   . PONV (postoperative nausea and vomiting)   . Preventative health care 03/21/2016  . Pure hypercholesterolemia   . Raynaud's syndrome     Past Surgical History:  Procedure Laterality Date  . BREAST SURGERY  11/20/13   left breast; Dr Donne Hazel  . CESAREAN SECTION  1999  . plantar fasciitis surgery  2011, 2007   both feet  . TONSILLECTOMY AND ADENOIDECTOMY  1981  . uterine fibroidectomy     @ c section    Family History  Problem Relation Age of Onset  . Adopted: Yes  . Heart attack Maternal Uncle 37  . Heart disease Maternal Uncle 37    MI  . Cervical cancer Maternal Grandmother   . Heart Problems Maternal Grandmother     arrhythmia  . Cancer Maternal Grandmother     gyn  . Stroke Mother     in 65s  . Diabetes Mother   . Heart disease Mother   . Hyperlipidemia Mother   . Hyperlipidemia Brother   . Depression Brother     1/2 brother  . Hypertension    . Cancer Maternal Aunt     numerous breast cancers  . Hypertension Paternal Grandmother   . Hypertension Paternal Grandfather      Social History   Social History  . Marital status: Married    Spouse name: [name]  . Number of children: 1  . Years of education: BSN   Occupational History  . Palliative Care Nurse  [place]   Social History Main Topics  . Smoking status: Former Smoker    Quit date: 10/16/1991  . Smokeless tobacco: Never Used     Comment: smoked 1981-1993, up to 1 ppd  . Alcohol use Yes     Comment: occasionally   . Drug use: No  . 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 Not on file   Social History Narrative   [name]).   Patient has one child.   Patient is employed at [employer].   Patient has a Copywriter, advertising.   Patient drinks two cups of coffee every morning.   Regular exercise- Cory Roughen Do             Outpatient Medications Prior to Visit  Medication Sig Dispense Refill  . calcium carbonate (OSCAL) 1500 (600 Ca) MG TABS  tablet Take 600 mg of elemental calcium by mouth daily.    . Cyanocobalamin (VITAMIN B-12) 2500 MCG SUBL Place under the tongue daily.    Marland Kitchen ibuprofen (ADVIL,MOTRIN) 200 MG tablet Take 400 mg by mouth every 6 (six) hours as needed.    . Multiple Vitamins-Minerals (CENTRUM SILVER PO) Take by mouth daily.    . Probiotic Product (PROBIOTIC DAILY PO) Take by mouth.    . venlafaxine XR (EFFEXOR-XR) 37.5 MG 24 hr capsule Take 1 capsule (37.5 mg total) by mouth at bedtime. 30 capsule 5  . zaleplon (SONATA) 10 MG capsule Take 1 capsule (10 mg total) by mouth at bedtime as needed for sleep. 30 capsule 1  . ciprofloxacin (CIPRO) 500 MG tablet Take 1 tablet (500 mg total) by mouth 2 (two) times daily. 14 tablet 0  . Ibuprofen-Diphenhydramine Cit (ADVIL PM PO) Take by mouth at bedtime as needed.      No facility-administered medications prior to visit.     Allergies  Allergen Reactions  . Ultram  [Tramadol] Nausea And Vomiting  . Hydrocodone-Acetaminophen     REACTION: NAUSEA  . Oxycodone-Acetaminophen     REACTION: NAUSEA    Review of Systems  Constitutional: Negative for chills, fever and malaise/fatigue.  HENT: Negative for congestion and hearing loss.   Eyes: Negative for discharge.  Respiratory: Negative for cough, sputum production and shortness of breath.   Cardiovascular: Negative for chest pain, palpitations and leg swelling.  Gastrointestinal: Negative for abdominal pain, blood in stool, constipation, diarrhea, heartburn, nausea and vomiting.  Genitourinary: Negative for dysuria, frequency, hematuria and urgency.  Musculoskeletal: Negative for back pain, falls and myalgias.  Skin: Negative for rash.  Neurological: Negative for dizziness, sensory change, loss of consciousness, weakness and headaches.  Endo/Heme/Allergies: Negative for environmental allergies. Does not bruise/bleed easily.  Psychiatric/Behavioral: Negative for depression and suicidal ideas. The patient is not nervous/anxious and does not have insomnia.        Objective:    Physical Exam  Constitutional: She is oriented to person, place, and time. She appears well-developed and well-nourished. No distress.  HENT:  Head: Normocephalic and atraumatic.  Eyes: Conjunctivae are normal.  Neck: Neck supple. No thyromegaly present.  Cardiovascular: Normal rate, regular rhythm and normal heart sounds.   No murmur heard. Pulmonary/Chest: Effort normal and breath sounds normal. No respiratory distress.  Abdominal: Soft. Bowel sounds are normal. She exhibits no distension and no mass. There is no tenderness.  Musculoskeletal: She exhibits no edema.  Lymphadenopathy:    She has no cervical adenopathy.  Neurological: She is alert and oriented to person, place, and time.  Skin: Skin is warm and dry.  Psychiatric: She has a normal mood and affect. Her behavior is normal.    BP 104/72 (BP Location: Left Arm,  Patient Position: Sitting, Cuff Size: Normal)   Pulse 66   Temp 98.5 F (36.9 C) (Oral)   Resp 16   Ht 5' 5"  (1.651 m)   Wt 146 lb 12.8 oz (66.6 kg)   SpO2 98%   BMI 24.43 kg/m  Wt Readings from Last 3 Encounters:  03/08/16 146 lb 12.8 oz (66.6 kg)  02/10/16 145 lb 6.4 oz (66 kg)  01/28/16 145 lb 3.2 oz (65.9 kg)     Lab Results  Component Value Date   WBC 5.6 03/10/2016   HGB 14.0 03/10/2016   HCT 41.1 03/10/2016   PLT 225.0 03/10/2016   GLUCOSE 97 03/10/2016   CHOL 192 03/10/2016   TRIG  60.0 03/10/2016   HDL 79.90 03/10/2016   LDLCALC 100 (H) 03/10/2016   ALT 43 (H) 03/10/2016   AST 51 (H) 03/10/2016   NA 138 03/10/2016   K 4.2 03/10/2016   CL 102 03/10/2016   CREATININE 0.93 03/10/2016   BUN 12 03/10/2016   CO2 29 03/10/2016   TSH 1.01 03/10/2016   HGBA1C 5.3 12/24/2013    Lab Results  Component Value Date   TSH 1.01 03/10/2016   Lab Results  Component Value Date   WBC 5.6 03/10/2016   HGB 14.0 03/10/2016   HCT 41.1 03/10/2016   MCV 91.2 03/10/2016   PLT 225.0 03/10/2016   Lab Results  Component Value Date   NA 138 03/10/2016   K 4.2 03/10/2016   CHLORIDE 105 01/05/2015   CO2 29 03/10/2016   GLUCOSE 97 03/10/2016   BUN 12 03/10/2016   CREATININE 0.93 03/10/2016   BILITOT 0.5 03/10/2016   ALKPHOS 78 03/10/2016   AST 51 (H) 03/10/2016   ALT 43 (H) 03/10/2016   PROT 7.6 03/10/2016   ALBUMIN 4.4 03/10/2016   CALCIUM 9.0 03/10/2016   ANIONGAP 9 01/05/2015   EGFR 63 (L) 01/05/2015   GFR 67.27 03/10/2016   Lab Results  Component Value Date   CHOL 192 03/10/2016   Lab Results  Component Value Date   HDL 79.90 03/10/2016   Lab Results  Component Value Date   LDLCALC 100 (H) 03/10/2016   Lab Results  Component Value Date   TRIG 60.0 03/10/2016   Lab Results  Component Value Date   CHOLHDL 2 03/10/2016   Lab Results  Component Value Date   HGBA1C 5.3 12/24/2013       Assessment & Plan:   Problem List Items Addressed This  Visit    PURE HYPERCHOLESTEROLEMIA    Encouraged heart healthy diet, increase exercise, avoid trans fats, consider a krill oil cap daily      PALPITATIONS   Relevant Orders   TSH (Completed)   EKG 12-Lead (Completed)   Insomnia w/ sleep apnea   Other abnormal glucose   Relevant Orders   Comprehensive metabolic panel (Completed)   Vitamin D deficiency    Level WNL, continue current supplements.       Relevant Orders   VITAMIN D 25 Hydroxy (Vit-D Deficiency, Fractures) (Completed)   Preventative health care    Patient encouraged to maintain heart healthy diet, regular exercise, adequate sleep. Consider daily probiotics. Take medications as prescribed. Given and reviewed copy of ACP documents from [employer] and encouraged to complete and return. Labs reviewed      Relevant Orders   Comprehensive metabolic panel (Completed)   TSH (Completed)   CBC (Completed)   Lipid panel (Completed)   Hepatitis C Antibody (Completed)    Other Visit Diagnoses    Urinary tract infection without hematuria, site unspecified    -  Primary   Relevant Orders   Urinalysis (Completed)   Urine culture (Completed)   Hyperlipidemia, mixed       Relevant Orders   TSH (Completed)   T4, free (Completed)   Lipid panel (Completed)   Heart murmur       Relevant Orders   CBC (Completed)   EKG 12-Lead (Completed)   Need for Tdap vaccination       Relevant Orders   Tdap vaccine greater than or equal to 7yo IM (Completed)      I have discontinued Ms. Goren's Ibuprofen-Diphenhydramine Cit (ADVIL PM PO)  and ciprofloxacin. I am also having her maintain her Multiple Vitamins-Minerals (CENTRUM SILVER PO), Vitamin B-12, calcium carbonate, venlafaxine XR, Probiotic Product (PROBIOTIC DAILY PO), ibuprofen, zaleplon, and Misc Natural Products (NARCOSOFT HERBAL LAX PO).  Meds ordered this encounter  Medications  . Misc Natural Products (NARCOSOFT HERBAL LAX PO)    Sig: Take by mouth.     Penni Homans, MD

## 2016-03-21 NOTE — Assessment & Plan Note (Signed)
Level WNL, continue current supplements.

## 2016-03-21 NOTE — Assessment & Plan Note (Signed)
Patient encouraged to maintain heart healthy diet, regular exercise, adequate sleep. Consider daily probiotics. Take medications as prescribed. Given and reviewed copy of ACP documents from [place] Secretary of State and encouraged to complete and return. Labs reviewed.  

## 2016-03-23 MED FILL — ZALEPLON 10 MG CAPSULE: 10 | 30 days supply | Qty: 30 | Fill #1

## 2016-03-23 MED FILL — VENLAFAXINE HCL ER 37.5 MG: 37.5 | 30 days supply | Qty: 30 | Fill #1

## 2016-04-14 ENCOUNTER — Other Ambulatory Visit: Payer: Self-pay | Admitting: Family Medicine

## 2016-04-14 ENCOUNTER — Encounter: Payer: Self-pay | Admitting: Family Medicine

## 2016-04-14 DIAGNOSIS — G4700 Insomnia, unspecified: Secondary | ICD-10-CM

## 2016-04-14 MED ORDER — VENLAFAXINE HCL ER 37.5 MG PO CP24: 37.5000 mg | ORAL_CAPSULE | Freq: Every day | ORAL | 5 refills | Status: DC

## 2016-04-14 MED ORDER — ZALEPLON 10 MG PO CAPS: 10.0000 mg | ORAL_CAPSULE | Freq: Every evening | ORAL | 5 refills | Status: DC | PRN

## 2016-04-18 MED FILL — ZALEPLON 10 MG CAPSULE: 10 | 30 days supply | Qty: 30 | Fill #0

## 2016-04-22 MED FILL — VENLAFAXINE HCL ER 37.5 MG: 37.5 | 30 days supply | Qty: 30 | Fill #0

## 2016-05-02 ENCOUNTER — Encounter: Payer: Self-pay | Admitting: Family Medicine

## 2016-05-02 ENCOUNTER — Ambulatory Visit (INDEPENDENT_AMBULATORY_CARE_PROVIDER_SITE_OTHER): Payer: Managed Care, Other (non HMO) | Admitting: Family Medicine

## 2016-05-02 VITALS — BP 122/86 | HR 62 | Temp 97.7°F | Ht 66.0 in | Wt 147.4 lb

## 2016-05-02 DIAGNOSIS — M26629 Arthralgia of temporomandibular joint, unspecified side: Secondary | ICD-10-CM | POA: Diagnosis not present

## 2016-05-02 MED ORDER — CYCLOBENZAPRINE HCL 5 MG PO TABS: 5.0000 mg | ORAL_TABLET | Freq: Three times a day (TID) | ORAL | 1 refills | Status: DC | PRN

## 2016-05-02 MED ORDER — NAPROXEN 500 MG PO TABS: 500.0000 mg | ORAL_TABLET | Freq: Two times a day (BID) | ORAL | 0 refills | Status: DC

## 2016-05-02 MED FILL — CYCLOBENZAPRINE 5 MG TABLET: 5 | 10 days supply | Qty: 30 | Fill #0

## 2016-05-02 MED FILL — NAPROXEN 500 MG TABLET: 500 | 10 days supply | Qty: 20 | Fill #0

## 2016-05-02 NOTE — Patient Instructions (Addendum)

## 2016-05-02 NOTE — Progress Notes (Signed)
Chief Complaint  Patient presents with  . Otitis Media    Pt reports Left ear pain x4 days since friday/ No draining congestion but notices scratchiness in her throat    Pt is here for left ear pain. Sharp, worse when opening mouth.  Does have a hx of clenching jaw during day and at night. Duration: 4 days Progression: better Associated symptoms: neck pain Denies: sore throat, congestion and low grade fevers, bleeding, or discharge from ear Treatment to date: None  ROS:  HEENT: +ear pain Costitutional: Denies fevers  Past Medical History:  Diagnosis Date  . Atypical lobular hyperplasia of left breast   . Heart murmur    ECHO 2012-trivial regurg-all else normal  . History of chicken pox   . Insomnia   . 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 Insomnia, persistent 08/28/2013   Menopausal induced insomnia, early morning awakenings, and snoring.   . Plantar fasciitis   . PONV (postoperative nausea and vomiting)   . Preventative health care 03/21/2016  . Pure hypercholesterolemia   . Raynaud's syndrome    Family History  Problem Relation Age of Onset  . Adopted: Yes  . Heart attack Maternal Uncle 37  . Heart disease Maternal Uncle 37    MI  . Cervical cancer Maternal Grandmother   . Heart Problems Maternal Grandmother     arrhythmia  . Cancer Maternal Grandmother     gyn  . Stroke Mother     in 43s  . Diabetes Mother   . Heart disease Mother   . Hyperlipidemia Mother   . Hyperlipidemia Brother   . Depression Brother     1/2 brother  . Hypertension    . Cancer Maternal Aunt     numerous breast cancers  . Hypertension Paternal Grandmother   . Hypertension Paternal Grandfather    Past Surgical History:  Procedure Laterality Date  . BREAST SURGERY  11/20/13   left breast; Dr Donne Hazel  . CESAREAN SECTION  1999  . plantar fasciitis surgery  2011, 2007   both feet  . TONSILLECTOMY AND ADENOIDECTOMY  1981  . uterine  fibroidectomy     @ c section    BP 122/86 (BP Location: Left Arm, Patient Position: Sitting, Cuff Size: Small)   Pulse 62   Temp 97.7 F (36.5 C) (Oral)   Ht 5\' 6"  (1.676 m)   Wt 147 lb 6.4 oz (66.9 kg)   SpO2 94%   BMI 23.79 kg/m  General: Awake, alert, appearing stated age HEENT:  L ear- Canal patent without drainage or erythema, TM is neg R ear- canal patent without drainage or erythema, TM is neg Nose- nares patent and without discharge Mouth- Lips, gums and dentition unremarkable, pharynx is without erythema or exudate Neck: No adenopathy Lungs: Normal effort, no accessory muscle use Psych: Age appropriate judgment and insight, normal mood and affect  TMJ syndrome - Plan: cyclobenzaprine (FLEXERIL) 5 MG tablet, naproxen (NAPROSYN) 500 MG tablet  Orders as above. F/u prn. Try to avoid activities with heavy chewing and clenching when possible. Pt voiced understanding and agreement to the plan.  [place], DO 05/02/16 4:27 PM

## 2016-05-02 NOTE — Progress Notes (Signed)
Pre visit review using our clinic review tool, if applicable. No additional management support is needed unless otherwise documented below in the visit note. 

## 2016-05-30 MED FILL — VENLAFAXINE HCL ER 37.5 MG: 37.5 | 30 days supply | Qty: 30 | Fill #1

## 2016-06-12 ENCOUNTER — Other Ambulatory Visit: Payer: Self-pay | Admitting: Oncology

## 2016-06-12 DIAGNOSIS — N6092 Unspecified benign mammary dysplasia of left breast: Secondary | ICD-10-CM

## 2016-06-15 ENCOUNTER — Telehealth: Payer: Self-pay | Admitting: Oncology

## 2016-06-15 ENCOUNTER — Ambulatory Visit: Payer: Private Health Insurance - Indemnity

## 2016-06-15 NOTE — Telephone Encounter (Signed)
11/30 Appointment canceled per patient request. Patient called to cancel appointment. She does not wish to reschedule at this time.

## 2016-06-16 ENCOUNTER — Other Ambulatory Visit: Payer: Private Health Insurance - Indemnity

## 2016-06-16 ENCOUNTER — Ambulatory Visit: Payer: Private Health Insurance - Indemnity | Admitting: Oncology

## 2016-06-27 ENCOUNTER — Ambulatory Visit: Payer: Managed Care, Other (non HMO)

## 2016-06-29 ENCOUNTER — Other Ambulatory Visit: Payer: Self-pay | Admitting: Oncology

## 2016-06-29 ENCOUNTER — Ambulatory Visit
Admission: RE | Admit: 2016-06-29 | Discharge: 2016-06-29 | Disposition: A | Discharge status: Home / Self Care | Payer: Managed Care, Other (non HMO) | Source: Ambulatory Visit | Attending: Oncology | Admitting: Oncology

## 2016-06-29 DIAGNOSIS — Z1231 Encounter for screening mammogram for malignant neoplasm of breast: Secondary | ICD-10-CM

## 2016-07-01 MED FILL — VENLAFAXINE HCL ER 37.5 MG: 37.5 | 30 days supply | Qty: 30 | Fill #2 | Status: TO

## 2016-07-01 MED FILL — ZALEPLON 10 MG CAPSULE: 10 | 30 days supply | Qty: 30 | Fill #1 | Status: TO

## 2016-08-26 MED FILL — VENLAFAXINE HCL ER 37.5 MG: 37.5 | 30 days supply | Qty: 30 | Fill #0

## 2016-08-26 MED FILL — ZALEPLON 10 MG CAPSULE: 10 | 30 days supply | Qty: 30 | Fill #0

## 2016-09-15 ENCOUNTER — Other Ambulatory Visit: Payer: Self-pay | Admitting: Obstetrics and Gynecology

## 2016-09-15 DIAGNOSIS — Z01419 Encounter for gynecological examination (general) (routine) without abnormal findings: Secondary | ICD-10-CM | POA: Diagnosis not present

## 2016-09-15 DIAGNOSIS — Z6823 Body mass index (BMI) 23.0-23.9, adult: Secondary | ICD-10-CM | POA: Diagnosis not present

## 2016-09-15 DIAGNOSIS — N951 Menopausal and female climacteric states: Secondary | ICD-10-CM | POA: Diagnosis not present

## 2016-09-15 DIAGNOSIS — Z124 Encounter for screening for malignant neoplasm of cervix: Secondary | ICD-10-CM | POA: Diagnosis not present

## 2016-09-19 LAB — CYTOLOGY - PAP

## 2016-10-05 MED FILL — VENLAFAXINE HCL ER 37.5 MG: 37.5 | 30 days supply | Qty: 30 | Fill #1

## 2016-10-05 MED FILL — ZALEPLON 10 MG CAPSULE: 10 | 30 days supply | Qty: 30 | Fill #1

## 2016-10-06 ENCOUNTER — Other Ambulatory Visit (HOSPITAL_COMMUNITY): Payer: Self-pay | Admitting: Obstetrics and Gynecology

## 2016-10-06 DIAGNOSIS — N950 Postmenopausal bleeding: Secondary | ICD-10-CM

## 2016-10-26 ENCOUNTER — Telehealth: Payer: Self-pay | Admitting: Oncology

## 2016-10-26 NOTE — Telephone Encounter (Signed)
Called patient re setting up f/u with YF or [facility] per [facility] request. Not able to reach patient or leave message.

## 2016-11-04 ENCOUNTER — Ambulatory Visit (HOSPITAL_COMMUNITY)
Admission: RE | Admit: 2016-11-04 | Discharge: 2016-11-04 | Disposition: A | Discharge status: Home / Self Care | Payer: 59 | Source: Ambulatory Visit | Attending: Obstetrics and Gynecology | Admitting: Obstetrics and Gynecology

## 2016-11-04 DIAGNOSIS — N950 Postmenopausal bleeding: Secondary | ICD-10-CM | POA: Diagnosis not present

## 2016-11-04 DIAGNOSIS — R938 Abnormal findings on diagnostic imaging of other specified body structures: Secondary | ICD-10-CM | POA: Diagnosis not present

## 2016-11-16 ENCOUNTER — Other Ambulatory Visit: Payer: Self-pay | Admitting: Family Medicine

## 2016-11-16 DIAGNOSIS — G4700 Insomnia, unspecified: Secondary | ICD-10-CM

## 2016-11-16 NOTE — Telephone Encounter (Signed)
Last zaleplon RX: 04/13/16, #30 x 5 refills Last OV: 04/2016, Acute. Last with PCP 02/2016 and advised 1 yr f/u Next OV: due 02/2017 UDS: No previous UDS or CSC on file. Rx printed and placed in PCP red folder for signature. Attached CSC with Rx for pt to pick up and provide UDS as well as complete CSC.

## 2016-11-17 NOTE — Telephone Encounter (Signed)
PCP signed hardcopy Called the patient informed to pickup hardcopy/complete contract/uds. Called left message to call bak

## 2016-11-18 NOTE — Telephone Encounter (Signed)
Called the patient informed ready for pickup/do contract/uds---she agree/understood all instructions.

## 2016-11-22 ENCOUNTER — Encounter: Payer: Self-pay | Admitting: Family Medicine

## 2016-11-22 DIAGNOSIS — Z79899 Other long term (current) drug therapy: Secondary | ICD-10-CM | POA: Diagnosis not present

## 2016-11-22 MED FILL — ZALEPLON 10 MG CAPSULE: 10 | 30 days supply | Qty: 30 | Fill #0

## 2016-11-30 MED FILL — VENLAFAXINE HCL ER 37.5 MG: 37.5 | 30 days supply | Qty: 30 | Fill #2

## 2016-12-16 ENCOUNTER — Telehealth: Payer: Self-pay | Admitting: Oncology

## 2016-12-16 NOTE — Telephone Encounter (Signed)
Patient returned my call re scheduling f/u with Dr Lindi Adie or Dr. Burr Medico at Dr. Mariana Kaufman request. Per patient she is scheduled for a bx procedure with her gyn 6/19 and will call back to schedule after the procedure/findings. Per patient she thinks it best to wait until after the procedure as her situation may change and she may needs someone specializing in gyn onc. Will await call from patient. Patient has my information, including my direct number.

## 2017-01-03 DIAGNOSIS — N840 Polyp of corpus uteri: Secondary | ICD-10-CM | POA: Diagnosis not present

## 2017-01-03 DIAGNOSIS — N841 Polyp of cervix uteri: Secondary | ICD-10-CM | POA: Diagnosis not present

## 2017-01-03 DIAGNOSIS — N938 Other specified abnormal uterine and vaginal bleeding: Secondary | ICD-10-CM | POA: Diagnosis not present

## 2017-01-03 DIAGNOSIS — N950 Postmenopausal bleeding: Secondary | ICD-10-CM | POA: Diagnosis not present

## 2017-01-03 DIAGNOSIS — Z3202 Encounter for pregnancy test, result negative: Secondary | ICD-10-CM | POA: Diagnosis not present

## 2017-01-10 ENCOUNTER — Other Ambulatory Visit: Payer: Self-pay | Admitting: Family Medicine

## 2017-01-10 MED FILL — ZALEPLON 10 MG CAPSULE: 10 | 30 days supply | Qty: 30 | Fill #1

## 2017-01-11 MED FILL — VENLAFAXINE HCL ER 37.5 MG: 37.5 | 30 days supply | Qty: 30 | Fill #0

## 2017-02-20 MED FILL — ZALEPLON 10 MG CAPSULE: 10 | 30 days supply | Qty: 30 | Fill #2

## 2017-03-10 MED FILL — VENLAFAXINE HCL ER 37.5 MG: 37.5 | 30 days supply | Qty: 30 | Fill #1

## 2017-04-06 MED FILL — VENLAFAXINE HCL ER 37.5 MG: 37.5 | 30 days supply | Qty: 30 | Fill #2

## 2017-04-06 MED FILL — ZALEPLON 10 MG CAPSULE: 10 | 30 days supply | Qty: 30 | Fill #3

## 2017-05-09 ENCOUNTER — Ambulatory Visit (INDEPENDENT_AMBULATORY_CARE_PROVIDER_SITE_OTHER): Payer: 59 | Admitting: Family Medicine

## 2017-05-09 ENCOUNTER — Encounter: Payer: Self-pay | Admitting: Family Medicine

## 2017-05-09 DIAGNOSIS — R202 Paresthesia of skin: Secondary | ICD-10-CM

## 2017-05-09 DIAGNOSIS — E559 Vitamin D deficiency, unspecified: Secondary | ICD-10-CM

## 2017-05-09 DIAGNOSIS — I7300 Raynaud's syndrome without gangrene: Secondary | ICD-10-CM

## 2017-05-09 DIAGNOSIS — R7309 Other abnormal glucose: Secondary | ICD-10-CM | POA: Diagnosis not present

## 2017-05-09 DIAGNOSIS — G4700 Insomnia, unspecified: Secondary | ICD-10-CM | POA: Diagnosis not present

## 2017-05-09 DIAGNOSIS — M545 Low back pain: Secondary | ICD-10-CM

## 2017-05-09 DIAGNOSIS — B001 Herpesviral vesicular dermatitis: Secondary | ICD-10-CM | POA: Diagnosis not present

## 2017-05-09 DIAGNOSIS — E7800 Pure hypercholesterolemia, unspecified: Secondary | ICD-10-CM

## 2017-05-09 HISTORY — DX: Paresthesia of skin: R20.2

## 2017-05-09 MED ORDER — VENLAFAXINE HCL ER 37.5 MG PO CP24: 37.5000 mg | ORAL_CAPSULE | Freq: Every day | ORAL | 5 refills | Status: DC

## 2017-05-09 MED ORDER — ZALEPLON 10 MG PO CAPS: ORAL_CAPSULE | ORAL | 5 refills | Status: DC

## 2017-05-09 MED ORDER — ACYCLOVIR 5 % EX CREA: TOPICAL_CREAM | CUTANEOUS | 1 refills | Status: DC

## 2017-05-09 NOTE — Assessment & Plan Note (Signed)
hgba1c acceptable, minimize simple carbs. Increase exercise as tolerated.  

## 2017-05-09 NOTE — Progress Notes (Signed)
Subjective:  I acted as a Education administrator for Dr. Charlett Blake. [place], [place]  Patient ID: Katherine [name], female    DOB: 1965/12/10, 51 y.o.   MRN: [id number]  No chief complaint on file.   HPI  Patient is in today for a follow up. She has a long history of insomnia snd she continues to use Sonata with good results and no concerning side effects. She feels well today. No recent febrile illness or hospitalizations. She is noting some right shoulder pain and low back pain but no recent trauma or falls. She is noting a long history of Raynaud's phenomenon and now is noting a sense of tingling in both hands and feet at times, not necessarily when she is cold. Denies CP/palp/SOB/HA/congestion/fevers/GI or GU c/o. Taking meds as prescribed  Patient Care Team: Mosie Lukes, MD as PCP - General (Family Medicine) Olga Millers, MD as Consulting Physician (Obstetrics and Gynecology) Ronald Lobo, MD as Consulting Physician (Gastroenterology)   Past Medical History:  Diagnosis Date  . Atypical lobular hyperplasia of left breast   . Heart murmur    ECHO 2012-trivial regurg-all else normal  . History of chicken pox   . Insomnia   . 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 Insomnia, persistent 08/28/2013   Menopausal induced insomnia, early morning awakenings, and snoring.   . Low back pain 05/10/2017  . Paresthesias 05/09/2017  . Plantar fasciitis   . PONV (postoperative nausea and vomiting)   . Preventative health care 03/21/2016  . Pure hypercholesterolemia   . Raynaud's syndrome   . 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 BREAST SURGERY  11/20/13   left breast; Dr Donne Hazel  . CESAREAN SECTION  1999  . plantar fasciitis surgery  2011, 2007   both feet  . TONSILLECTOMY AND ADENOIDECTOMY  1981  . uterine fibroidectomy     @ c section    Family History  Problem Relation Age of Onset  . Adopted: Yes    . Heart attack Maternal Uncle 37  . Heart disease Maternal Uncle 37       MI  . Cervical cancer Maternal Grandmother   . Heart Problems Maternal Grandmother        arrhythmia  . Cancer Maternal Grandmother        gyn  . Stroke Mother        in 54s  . Diabetes Mother   . Heart disease Mother   . Hyperlipidemia Mother   . Hyperlipidemia Brother   . Depression Brother        1/2 brother  . Hypertension Unknown   . Cancer Maternal Aunt        numerous breast cancers  . Hypertension Paternal Grandmother   . Hypertension Paternal Grandfather     Social History   Social History  . Marital status: Married    Spouse name: [name]  . Number of children: 1  . Years of education: BSN   Occupational History  . Palliative Care Nurse  [employer]   Social History Main Topics  . Smoking status: Former Smoker    Quit date: 10/16/1991  . Smokeless tobacco: Never Used     Comment: smoked 1981-1993, up to 1 ppd  . Alcohol use Yes     Comment: occasionally   . Drug use: No  . 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 Not on file   Social History Narrative   [name]).   Patient has one child.   Patient is employed at [employer].   Patient has a Copywriter, advertising.   Patient drinks two cups of coffee every morning.   Regular exercise- Cory Roughen Do             Outpatient Medications Prior to Visit  Medication Sig Dispense Refill  . calcium carbonate (OSCAL) 1500 (600 Ca) MG TABS tablet Take 600 mg of elemental calcium by mouth daily.    . Cyanocobalamin (VITAMIN B-12) 2500 MCG SUBL Place under the tongue daily.    Marland Kitchen ibuprofen (ADVIL,MOTRIN) 200 MG tablet Take 400 mg by mouth every 6 (six) hours as needed.    . Misc Natural Products (NARCOSOFT HERBAL LAX PO) Take by mouth.    . Multiple Vitamins-Minerals (CENTRUM  SILVER PO) Take by mouth daily.    . Probiotic Product (PROBIOTIC DAILY PO) Take by mouth.    . venlafaxine XR (EFFEXOR-XR) 37.5 MG 24 hr capsule TAKE 1 CAPSULE BY MOUTH AT BEDTIME 30 capsule 2  . zaleplon (SONATA) 10 MG capsule TAKE 1 CAPSULE BY MOUTH AT BEDTIME AS NEEDED FOR SLEEP 30 capsule 3  . cyclobenzaprine (FLEXERIL) 5 MG tablet Take 1 tablet (5 mg total) by mouth 3 (three) times daily as needed for muscle spasms. 30 tablet 1  . naproxen (NAPROSYN) 500 MG tablet Take 1 tablet (500 mg total) by mouth 2 (two) times daily with a meal. 20 tablet 0   No facility-administered medications prior to visit.     Allergies  Allergen Reactions  . Ultram [Tramadol] Nausea And Vomiting  . Hydrocodone-Acetaminophen     REACTION: NAUSEA  . Oxycodone-Acetaminophen     REACTION: NAUSEA    Review of Systems  Constitutional: Negative for fever and malaise/fatigue.  HENT: Negative for congestion.   Eyes: Negative for blurred vision.  Respiratory: Negative for shortness of breath.   Cardiovascular: Negative for chest pain, palpitations and leg swelling.  Gastrointestinal: Negative for abdominal pain, blood in stool and nausea.  Genitourinary: Negative for dysuria and frequency.  Musculoskeletal: Positive for back pain and joint pain. Negative for falls.  Skin: Negative for rash.  Neurological: Positive for sensory change. Negative for dizziness, loss of consciousness and headaches.  Endo/Heme/Allergies: Negative for environmental allergies.  Psychiatric/Behavioral: Negative for depression. The patient has insomnia. The patient is not nervous/anxious.        Objective:    Physical Exam  Constitutional: She is oriented to person, place, and time. She appears well-developed and well-nourished. No distress.  HENT:  Head: Normocephalic and atraumatic.  Nose: Nose normal.  Eyes: Right eye exhibits no discharge. Left eye exhibits no discharge.  Neck: Normal range of motion. Neck supple.   Cardiovascular: Normal rate and regular rhythm.   No murmur heard. Pulmonary/Chest: Effort normal and breath sounds normal.  Abdominal: Soft. Bowel sounds are normal. There is no tenderness.  Musculoskeletal: She exhibits no edema.  Neurological: She is alert and oriented to person, place, and time.  Skin: Skin is warm and dry.  Psychiatric: She has a normal mood and affect.  Nursing note and vitals reviewed.   BP 112/80 (BP Location: Left Arm, Patient Position: Sitting, Cuff Size: Normal)   Pulse (!) 59   Temp 97.6 F (36.4  C) (Oral)   Resp 18   Wt 142 lb 6.4 oz (64.6 kg)   SpO2 97%   BMI 22.98 kg/m  Wt Readings from Last 3 Encounters:  05/09/17 142 lb 6.4 oz (64.6 kg)  05/02/16 147 lb 6.4 oz (66.9 kg)  03/08/16 146 lb 12.8 oz (66.6 kg)   BP Readings from Last 3 Encounters:  05/09/17 112/80  05/02/16 122/86  03/08/16 104/72     Immunization History  Administered Date(s) Administered  . Influenza Nasal 04/09/2014  . Influenza Split 04/16/2015  . Influenza Whole 03/18/2012  . Tdap 03/08/2016    Health Maintenance  Topic Date Due  . HIV Screening  02/20/1979  . INFLUENZA VACCINE  02/15/2017  . MAMMOGRAM  06/29/2018  . PAP SMEAR  09/16/2019  . COLONOSCOPY  01/08/2025  . TETANUS/TDAP  03/08/2026  . Hepatitis C Screening  Completed    Lab Results  Component Value Date   WBC 5.6 03/10/2016   HGB 14.0 03/10/2016   HCT 41.1 03/10/2016   PLT 225.0 03/10/2016   GLUCOSE 97 03/10/2016   CHOL 192 03/10/2016   TRIG 60.0 03/10/2016   HDL 79.90 03/10/2016   LDLCALC 100 (H) 03/10/2016   ALT 43 (H) 03/10/2016   AST 51 (H) 03/10/2016   NA 138 03/10/2016   K 4.2 03/10/2016   CL 102 03/10/2016   CREATININE 0.93 03/10/2016   BUN 12 03/10/2016   CO2 29 03/10/2016   TSH 1.01 03/10/2016   HGBA1C 5.3 12/24/2013    Lab Results  Component Value Date   TSH 1.01 03/10/2016   Lab Results  Component Value Date   WBC 5.6 03/10/2016   HGB 14.0 03/10/2016   HCT 41.1  03/10/2016   MCV 91.2 03/10/2016   PLT 225.0 03/10/2016   Lab Results  Component Value Date   NA 138 03/10/2016   K 4.2 03/10/2016   CHLORIDE 105 01/05/2015   CO2 29 03/10/2016   GLUCOSE 97 03/10/2016   BUN 12 03/10/2016   CREATININE 0.93 03/10/2016   BILITOT 0.5 03/10/2016   ALKPHOS 78 03/10/2016   AST 51 (H) 03/10/2016   ALT 43 (H) 03/10/2016   PROT 7.6 03/10/2016   ALBUMIN 4.4 03/10/2016   CALCIUM 9.0 03/10/2016   ANIONGAP 9 01/05/2015   EGFR 63 (L) 01/05/2015   GFR 67.27 03/10/2016   Lab Results  Component Value Date   CHOL 192 03/10/2016   Lab Results  Component Value Date   HDL 79.90 03/10/2016   Lab Results  Component Value Date   LDLCALC 100 (H) 03/10/2016   Lab Results  Component Value Date   TRIG 60.0 03/10/2016   Lab Results  Component Value Date   CHOLHDL 2 03/10/2016   Lab Results  Component Value Date   HGBA1C 5.3 12/24/2013         Assessment & Plan:   Problem List Items Addressed This Visit    PURE HYPERCHOLESTEROLEMIA    Encouraged heart healthy diet, increase exercise, avoid trans fats, consider a krill oil cap daily      Relevant Orders   Lipid panel   RAYNAUDS SYNDROME    Encouraged SmartWool socks      Insomnia, persistent    Encouraged good sleep hygiene such as dark, quiet room. No blue/green glowing lights such as computer screens in bedroom. No alcohol or stimulants in evening. Cut down on caffeine as able. Regular exercise is helpful but not just prior to bed time. Given a refill on Sonata  Relevant Medications   zaleplon (SONATA) 10 MG capsule   Other Relevant Orders   TSH   Other abnormal glucose    hgba1c acceptable, minimize simple carbs. Increase exercise as tolerated.       Relevant Orders   Comprehensive metabolic panel   Lipid panel   TSH   Vitamin D deficiency    Check level encouraged daily supplements      Relevant Orders   VITAMIN D 25 Hydroxy (Vit-D Deficiency, Fractures)   Paresthesias      Labs checked if no cause identified can consider referral to neurology if worsens.       Relevant Orders   Vitamin B12   Folate   Vitamin B1   CBC   TSH   Low back pain    Encouraged moist heat and gentle stretching as tolerated. May try NSAIDs and prescription meds as directed and report if symptoms worsen or seek immediate care      Recurrent cold sores    Given rx for Zovirax cream prn      Relevant Medications   acyclovir cream (ZOVIRAX) 5 %      I have discontinued Ms. Galka's cyclobenzaprine and naproxen. I have also changed her venlafaxine XR. Additionally, I am having her start on acyclovir cream. Lastly, I am having her maintain her Multiple Vitamins-Minerals (CENTRUM SILVER PO), Vitamin B-12, calcium carbonate, Probiotic Product (PROBIOTIC DAILY PO), ibuprofen, Misc Natural Products (NARCOSOFT HERBAL LAX PO), and zaleplon.  Meds ordered this encounter  Medications  . venlafaxine XR (EFFEXOR-XR) 37.5 MG 24 hr capsule    Sig: Take 1 capsule (37.5 mg total) by mouth at bedtime.    Dispense:  30 capsule    Refill:  5  . zaleplon (SONATA) 10 MG capsule    Sig: TAKE 1 CAPSULE BY MOUTH AT BEDTIME AS NEEDED FOR SLEEP    Dispense:  30 capsule    Refill:  5  . acyclovir cream (ZOVIRAX) 5 %    Sig: Apply topically 5 x daily x 4 days prn lesion    Dispense:  15 g    Refill:  1    CMA served as scribe during this visit. History, Physical and Plan performed by medical provider. Documentation and orders reviewed and attested to.  Penni Homans, MD

## 2017-05-09 NOTE — Assessment & Plan Note (Addendum)
Check level encouraged daily supplements

## 2017-05-09 NOTE — Assessment & Plan Note (Addendum)
Labs checked if no cause identified can consider referral to neurology if worsens.

## 2017-05-09 NOTE — Patient Instructions (Addendum)
[facility] or [facility] on [address] Paresthesia Paresthesia is a burning or prickling feeling. This feeling can happen in any part of the body. It often happens in the hands, arms, legs, or feet. Usually, it is not painful. In most cases, the feeling goes away in a short time and is not a sign of a serious problem. Follow these instructions at home:  Avoid drinking alcohol.  Try massage or needle therapy (acupuncture) to help with your problems.  Keep all follow-up visits as told by your doctor. This is important. Contact a doctor if:  You keep on having episodes of paresthesia.  Your burning or prickling feeling gets worse when you walk.  You have pain or cramps.  You feel dizzy.  You have a rash. Get help right away if:  You feel weak.  You have trouble walking or moving.  You have problems speaking, understanding, or seeing.  You feel confused.  You cannot control when you pee (urinate) or poop (bowel movement).  You lose feeling (numbness) after an injury.  You pass out (faint). This information is not intended to replace advice given to you by your health care provider. Make sure you discuss any questions you have with your health care provider. Document Released: 06/16/2008 Document Revised: 12/10/2015 Document Reviewed: 06/30/2014 Elsevier Interactive Patient Education  [employer].

## 2017-05-09 NOTE — Assessment & Plan Note (Signed)
Encouraged heart healthy diet, increase exercise, avoid trans fats, consider a krill oil cap daily 

## 2017-05-10 ENCOUNTER — Encounter: Payer: Self-pay | Admitting: Family Medicine

## 2017-05-10 DIAGNOSIS — B001 Herpesviral vesicular dermatitis: Secondary | ICD-10-CM | POA: Insufficient documentation

## 2017-05-10 DIAGNOSIS — M545 Low back pain, unspecified: Secondary | ICD-10-CM

## 2017-05-10 HISTORY — DX: Low back pain, unspecified: M54.50

## 2017-05-10 HISTORY — DX: Herpesviral vesicular dermatitis: B00.1

## 2017-05-10 MED FILL — ZOVIRAX 5% CREAM: 5 | 30 days supply | Qty: 15 | Fill #0

## 2017-05-10 MED FILL — VENLAFAXINE HCL ER 37.5 MG: 37.5 | 30 days supply | Qty: 30 | Fill #0

## 2017-05-10 NOTE — Assessment & Plan Note (Signed)
Encouraged SmartWool socks

## 2017-05-10 NOTE — Assessment & Plan Note (Signed)
Given rx for Zovirax cream prn

## 2017-05-10 NOTE — Assessment & Plan Note (Signed)
Encouraged moist heat and gentle stretching as tolerated. May try NSAIDs and prescription meds as directed and report if symptoms worsen or seek immediate care 

## 2017-05-10 NOTE — Assessment & Plan Note (Addendum)
Encouraged good sleep hygiene such as dark, quiet room. No blue/green glowing lights such as computer screens in bedroom. No alcohol or stimulants in evening. Cut down on caffeine as able. Regular exercise is helpful but not just prior to bed time. Given a refill on Sonata

## 2017-05-11 ENCOUNTER — Other Ambulatory Visit (INDEPENDENT_AMBULATORY_CARE_PROVIDER_SITE_OTHER): Payer: 59

## 2017-05-11 ENCOUNTER — Telehealth: Payer: Self-pay | Admitting: *Deleted

## 2017-05-11 DIAGNOSIS — R7309 Other abnormal glucose: Secondary | ICD-10-CM

## 2017-05-11 DIAGNOSIS — E7800 Pure hypercholesterolemia, unspecified: Secondary | ICD-10-CM

## 2017-05-11 DIAGNOSIS — G4700 Insomnia, unspecified: Secondary | ICD-10-CM

## 2017-05-11 DIAGNOSIS — R202 Paresthesia of skin: Secondary | ICD-10-CM

## 2017-05-11 DIAGNOSIS — Z0000 Encounter for general adult medical examination without abnormal findings: Secondary | ICD-10-CM

## 2017-05-11 DIAGNOSIS — E559 Vitamin D deficiency, unspecified: Secondary | ICD-10-CM

## 2017-05-11 LAB — CBC
HCT: 43.2 % (ref 36.0–46.0)
HEMOGLOBIN: 14.6 g/dL (ref 12.0–15.0)
MCHC: 33.8 g/dL (ref 30.0–36.0)
MCV: 94.2 fl (ref 78.0–100.0)
PLATELETS: 233 10*3/uL (ref 150.0–400.0)
RBC: 4.59 Mil/uL (ref 3.87–5.11)
RDW: 12.9 % (ref 11.5–15.5)
WBC: 5.3 10*3/uL (ref 4.0–10.5)

## 2017-05-11 LAB — COMPREHENSIVE METABOLIC PANEL
ALBUMIN: 4.9 g/dL (ref 3.5–5.2)
ALT: 14 U/L (ref 0–35)
AST: 22 U/L (ref 0–37)
Alkaline Phosphatase: 78 U/L (ref 39–117)
BUN: 14 mg/dL (ref 6–23)
CALCIUM: 10.2 mg/dL (ref 8.4–10.5)
CHLORIDE: 102 meq/L (ref 96–112)
CO2: 29 meq/L (ref 19–32)
Creatinine, Ser: 0.95 mg/dL (ref 0.40–1.20)
GFR: 65.35 mL/min (ref >60.00)
GLUCOSE: 107 mg/dL — AB (ref 70–99)
Potassium: 4.4 mEq/L (ref 3.5–5.1)
Sodium: 140 mEq/L (ref 135–145)
TOTAL PROTEIN: 8.1 g/dL (ref 6.0–8.3)
Total Bilirubin: 0.5 mg/dL (ref 0.2–1.2)

## 2017-05-11 LAB — LIPID PANEL
CHOL/HDL RATIO: 2
CHOLESTEROL: 219 mg/dL — AB (ref 0–200)
HDL: 94.9 mg/dL (ref >39.00)
LDL CALC: 113 mg/dL — AB (ref 0–99)
NONHDL: 124.5
Triglycerides: 56 mg/dL (ref 0.0–149.0)
VLDL: 11.2 mg/dL (ref 0.0–40.0)

## 2017-05-11 LAB — HEMOGLOBIN A1C: Hgb A1c MFr Bld: 5.4 % (ref 4.6–6.5)

## 2017-05-11 LAB — VITAMIN B12: VITAMIN B 12: 546 pg/mL (ref 211–911)

## 2017-05-11 LAB — FOLATE: Folate: 19.3 ng/mL (ref >5.9)

## 2017-05-11 LAB — TSH: TSH: 1.25 u[IU]/mL (ref 0.35–4.50)

## 2017-05-11 LAB — VITAMIN D 25 HYDROXY (VIT D DEFICIENCY, FRACTURES): VITD: 52.05 ng/mL (ref 30.00–100.00)

## 2017-05-11 MED FILL — ZALEPLON 10 MG CAPSULE: 10 | 30 days supply | Qty: 30 | Fill #0

## 2017-05-11 NOTE — Telephone Encounter (Signed)
Copied from [place] [id number]. Topic: Quick Communication - See Telephone Encounter >> May 11, 2017  9:37 AM Eli Phillips, NT wrote: CRM for notification. See Telephone encounter for:  05/11/17.  Toya from lab called and states the patient would like a A1C drawn ,. Patient states she talked to Dr. Charlett Blake about it at her last appt and she has not had one drawn in awhile . Carolee Rota needs a order placed. The lab contact number is [phone]. Please contact if you have questions

## 2017-05-11 NOTE — Telephone Encounter (Signed)
A1c placed

## 2017-05-16 DIAGNOSIS — H524 Presbyopia: Secondary | ICD-10-CM | POA: Diagnosis not present

## 2017-05-16 DIAGNOSIS — H5213 Myopia, bilateral: Secondary | ICD-10-CM | POA: Diagnosis not present

## 2017-05-16 LAB — VITAMIN B1: VITAMIN B1 (THIAMINE): 8 nmol/L (ref 8–30)

## 2017-06-13 MED FILL — ZALEPLON 10 MG CAPSULE: 10 | 30 days supply | Qty: 30 | Fill #1

## 2017-06-19 ENCOUNTER — Other Ambulatory Visit: Payer: Self-pay | Admitting: Obstetrics and Gynecology

## 2017-06-19 DIAGNOSIS — Z1231 Encounter for screening mammogram for malignant neoplasm of breast: Secondary | ICD-10-CM

## 2017-07-10 MED FILL — ZALEPLON 10 MG CAPSULE: 10 | 30 days supply | Qty: 30 | Fill #2

## 2017-07-10 MED FILL — VENLAFAXINE HCL ER 37.5 MG: 37.5 | 30 days supply | Qty: 30 | Fill #1

## 2017-07-28 ENCOUNTER — Ambulatory Visit: Payer: 59

## 2017-07-28 ENCOUNTER — Ambulatory Visit
Admission: RE | Admit: 2017-07-28 | Discharge: 2017-07-28 | Disposition: A | Discharge status: Home / Self Care | Payer: 59 | Source: Ambulatory Visit | Attending: Obstetrics and Gynecology | Admitting: Obstetrics and Gynecology

## 2017-07-28 DIAGNOSIS — Z1231 Encounter for screening mammogram for malignant neoplasm of breast: Secondary | ICD-10-CM

## 2017-08-18 MED FILL — ZALEPLON 10 MG CAPSULE: 10 | 30 days supply | Qty: 30 | Fill #3

## 2017-09-13 ENCOUNTER — Encounter: Payer: Self-pay | Admitting: Family Medicine

## 2017-09-13 ENCOUNTER — Other Ambulatory Visit: Payer: Self-pay | Admitting: Family Medicine

## 2017-09-13 DIAGNOSIS — M79642 Pain in left hand: Principal | ICD-10-CM

## 2017-09-13 DIAGNOSIS — M79641 Pain in right hand: Secondary | ICD-10-CM

## 2017-09-19 MED FILL — ZALEPLON 10 MG CAPSULE: 10 | 30 days supply | Qty: 30 | Fill #4

## 2017-09-19 MED FILL — VENLAFAXINE HCL ER 37.5 MG: 37.5 | 30 days supply | Qty: 30 | Fill #2

## 2017-11-06 ENCOUNTER — Telehealth: Payer: Self-pay | Admitting: Family Medicine

## 2017-11-06 DIAGNOSIS — G4700 Insomnia, unspecified: Secondary | ICD-10-CM

## 2017-11-08 NOTE — Telephone Encounter (Signed)
Requesting: SONATA 10 MG Contract: 11/21/16 UDS:  11/21/16 Low risk Last OV:05/09/17 Next OV:  11/20/17 Last Refill:  05/09/17  #30   Please advise in absents of Dr. Charlett Blake

## 2017-11-09 MED FILL — ZALEPLON 10 MG CAPSULE: 10 | 30 days supply | Qty: 30 | Fill #0

## 2017-11-09 NOTE — Telephone Encounter (Signed)
Sent!

## 2017-11-13 ENCOUNTER — Telehealth: Payer: Self-pay

## 2017-11-13 NOTE — Telephone Encounter (Signed)
Okay for patient to transfer from Dr. Charlett Blake to Dr. Deborra Medina?       Copied from [place] [id number]. Topic: Appointment Scheduling - Scheduling Inquiry for Clinic >> Nov 13, 2017  1:24 PM Lennox Solders wrote: Reason for CRM: pt would like to switch to dr Deborra Medina due to location closer location than [place] [facility]

## 2017-11-13 NOTE — Telephone Encounter (Signed)
OK with me if OK with Dr Deborra Medina

## 2017-11-14 DIAGNOSIS — M2550 Pain in unspecified joint: Secondary | ICD-10-CM | POA: Diagnosis not present

## 2017-11-14 DIAGNOSIS — Z6824 Body mass index (BMI) 24.0-24.9, adult: Secondary | ICD-10-CM | POA: Diagnosis not present

## 2017-11-14 DIAGNOSIS — I7300 Raynaud's syndrome without gangrene: Secondary | ICD-10-CM | POA: Diagnosis not present

## 2017-11-14 NOTE — Telephone Encounter (Signed)
Okay with me 

## 2017-11-14 NOTE — Telephone Encounter (Signed)
Okay to schedule patient.  Thanks 

## 2017-11-14 NOTE — Telephone Encounter (Signed)
LMOVM for pt stating that both providers agree in pt switching to Dr. Alger Memos that she call to schedule an appt/PEC plz sched if she calls/thx dmf

## 2017-11-17 LAB — BASIC METABOLIC PANEL WITH GFR
BUN: 17 (ref 4–21)
Creatinine: 0.9 (ref 0.5–1.1)
Glucose: 95
Potassium: 4.2 (ref 3.4–5.3)
Sodium: 144 (ref 137–147)

## 2017-11-17 LAB — CBC AND DIFFERENTIAL
HCT: 42 (ref 36–46)
Hemoglobin: 14.1 (ref 12.0–16.0)
NEUTROS ABS: 3
PLATELETS: 247 (ref 150–399)
WBC: 4.6

## 2017-11-17 LAB — HEPATIC FUNCTION PANEL
ALT: 14 (ref 7–35)
AST: 23 (ref 13–35)
Alkaline Phosphatase: 85 (ref 25–125)
Bilirubin, Total: 0.3

## 2017-11-20 ENCOUNTER — Encounter: Payer: 59 | Admitting: Family Medicine

## 2017-11-23 DIAGNOSIS — I7300 Raynaud's syndrome without gangrene: Secondary | ICD-10-CM | POA: Diagnosis not present

## 2017-11-23 DIAGNOSIS — M2550 Pain in unspecified joint: Secondary | ICD-10-CM | POA: Diagnosis not present

## 2017-11-23 DIAGNOSIS — M359 Systemic involvement of connective tissue, unspecified: Secondary | ICD-10-CM | POA: Diagnosis not present

## 2017-11-23 DIAGNOSIS — Z6823 Body mass index (BMI) 23.0-23.9, adult: Secondary | ICD-10-CM | POA: Diagnosis not present

## 2017-11-23 MED FILL — HYDROXYCHLOROQUINE 200 MG T: 200 | 30 days supply | Qty: 60 | Fill #0

## 2017-12-12 DIAGNOSIS — Z124 Encounter for screening for malignant neoplasm of cervix: Secondary | ICD-10-CM | POA: Diagnosis not present

## 2017-12-12 DIAGNOSIS — Z01419 Encounter for gynecological examination (general) (routine) without abnormal findings: Secondary | ICD-10-CM | POA: Diagnosis not present

## 2017-12-18 MED FILL — VENLAFAXINE HCL ER 37.5 MG: 37.5 | 30 days supply | Qty: 30 | Fill #3

## 2017-12-18 MED FILL — ZALEPLON 10 MG CAPSULE: 10 | 30 days supply | Qty: 30 | Fill #1

## 2018-01-29 ENCOUNTER — Ambulatory Visit: Payer: 59 | Admitting: Family Medicine

## 2018-01-31 MED FILL — ZALEPLON 10 MG CAPSULE: 10 | 30 days supply | Qty: 30 | Fill #2

## 2018-02-06 ENCOUNTER — Encounter: Payer: Self-pay | Admitting: Family Medicine

## 2018-02-06 ENCOUNTER — Ambulatory Visit (INDEPENDENT_AMBULATORY_CARE_PROVIDER_SITE_OTHER): Payer: 59 | Admitting: Family Medicine

## 2018-02-06 VITALS — BP 124/76 | HR 76 | Temp 98.4°F | Ht 66.0 in | Wt 145.6 lb

## 2018-02-06 DIAGNOSIS — G4700 Insomnia, unspecified: Secondary | ICD-10-CM

## 2018-02-06 DIAGNOSIS — M359 Systemic involvement of connective tissue, unspecified: Secondary | ICD-10-CM | POA: Insufficient documentation

## 2018-02-06 MED ORDER — TRAZODONE HCL 50 MG PO TABS: 25.0000 mg | ORAL_TABLET | Freq: Every evening | ORAL | 3 refills | Status: DC | PRN

## 2018-02-06 MED FILL — traZODone HCL 50 MG TABS: 50 | 30 days supply | Qty: 30 | Fill #0

## 2018-02-06 NOTE — Progress Notes (Signed)
Subjective:   Patient ID: Katherine [name], female    DOB: January 02, 1966, 52 y.o.   MRN: [id number]  Katherine [name] is a pleasant 52 y.o. year old female who presents to clinic today with Transfer of Care (Patient is here today to tranfer care from Dr. Charlett [name] to Dr. Deborra [name].  She sees Dr. Marijean [name] for Undifferentiated Connetive Tissue Dz and was prescribed Plaquanil 200mg  1bid but is unsure if she will be continuing this due to the dizziness. She is not currently fasting.  She brought outside labs that were completed by [facility].  She states that she takes the Venlafaxine PRN for hot flashes but is unsure if it is truly helpful.)  on 02/06/2018  HPI: Here to establish care from Dr. Charlett [name].  Chart reviewed prior to patient's appointment.  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 Amil [name]).  Admits that she only took plaquenil 200 mg twice daily for a week or less because it caused dizziness.  She is not sure if it helped with her hand, hip or knee pain.  Insomnia- chronic issue.  Difficulty falling and staying asleep.  Started on Ambien years ago by Dr. Linna [name].  Dr. Charlett [name] helped her to wean off of this and to something less habit forming- has been taking Sonata but would like to consider even less habit forming options and discuss this today.  Current Outpatient Medications on File Prior to Visit  Medication Sig Dispense Refill  . acyclovir cream (ZOVIRAX) 5 % Apply topically 5 x daily x 4 days prn lesion 15 g 1  . calcium carbonate (OSCAL) 1500 (600 Ca) MG TABS tablet Take 600 mg of elemental calcium by mouth daily.    . Cyanocobalamin (VITAMIN B-12) 2500 MCG SUBL Place under the tongue daily.    . hydroxychloroquine (PLAQUENIL) 200 MG tablet Take 1 tablet by mouth 2 (two) times daily.  3  . ibuprofen (ADVIL,MOTRIN) 200 MG tablet Take 400 mg by mouth every 6 (six) hours as needed.    . Misc Natural Products (NARCOSOFT HERBAL LAX PO) Take by mouth.      . Multiple Vitamins-Minerals (CENTRUM SILVER PO) Take by mouth daily.    . Probiotic Product (PROBIOTIC DAILY PO) Take by mouth.    . venlafaxine XR (EFFEXOR-XR) 37.5 MG 24 hr capsule Take 1 capsule (37.5 mg total) by mouth at bedtime. 30 capsule 5  . zaleplon (SONATA) 10 MG capsule TAKE 1 CAPSULE BY MOUTH AT BEDTIME AS NEEDED FOR SLEEP 30 capsule 5   No current facility-administered medications on file prior to visit.     Allergies  Allergen Reactions  . Ultram [Tramadol] Nausea And Vomiting  . Codeine Itching and Nausea And Vomiting  . Hydrocodone-Acetaminophen     REACTION: NAUSEA  . Oxycodone-Acetaminophen     REACTION: NAUSEA    Past Medical History:  Diagnosis Date  . Atypical lobular hyperplasia of left breast   . Heart murmur    ECHO 2012-trivial regurg-all else normal  . History of chicken pox   . Insomnia   . 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 Insomnia, persistent 08/28/2013   Menopausal induced insomnia, early morning awakenings, and snoring.   . Low back pain 05/10/2017  . Paresthesias 05/09/2017  . Plantar fasciitis   . PONV (postoperative nausea and vomiting)   . Preventative health care 03/21/2016  . Pure hypercholesterolemia   . Raynaud's syndrome   . Recurrent  cold sores 05/10/2017    Past Surgical History:  Procedure Laterality Date  . BREAST EXCISIONAL BIOPSY Left   . BREAST SURGERY  11/20/13   left breast; Dr Donne [name]  . CESAREAN SECTION  1999  . plantar fasciitis surgery  2011, 2007   both feet  . TONSILLECTOMY AND ADENOIDECTOMY  1981  . uterine fibroidectomy     @ c section    Family History  Adopted: Yes  Problem Relation Age of Onset  . Heart attack Maternal Uncle 37  . Heart disease Maternal Uncle 37       MI  . Cervical cancer Maternal Grandmother   . Heart Problems Maternal Grandmother        arrhythmia  . Cancer Maternal Grandmother        gyn  . Stroke Mother        in 82s  .  Diabetes Mother   . Heart disease Mother   . Hyperlipidemia Mother   . Hyperlipidemia Brother   . Cancer Maternal Aunt        numerous breast cancers  . Depression Brother        1/2 brother  . Hypertension Unknown   . Hypertension Paternal Grandmother   . Hypertension Paternal Grandfather     Social History   Socioeconomic History  . Marital status: Married    Spouse name: Katherine [name]  . Number of children: 1  . Years of education: BSN  . Highest education level: Not on file  Occupational History  . Occupation: Palliative Care Nurse     Employer: [employer]  . Financial resource strain: Not on file  . Food insecurity:    Worry: Not on file    Inability: Not on file  . Transportation needs:    Medical: Not on file    Non-medical: Not on file  Tobacco Use  . Smoking status: Former Smoker    Last attempt to quit: 10/16/1991    Years since quitting: 26.3  . Smokeless tobacco: Never Used  . Tobacco comment: smoked 1981-1993, up to 1 ppd  Substance and Sexual Activity  . Alcohol use: Yes    Comment: occasionally   . Drug use: No  . 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 Physical activity:    Days per week: Not on file    Minutes per session: Not on file  . Stress: Not on file  Relationships  . Social connections:    Talks on phone: Not on file    Gets together: Not on file    Attends religious service: Not on file    Active member of club or organization: Not on file    Attends meetings of clubs or organizations: Not on file    Relationship status: Not on file  . Intimate partner violence:    Fear of current or ex partner: Not on file    Emotionally abused: Not on file    Physically abused: Not on file    Forced sexual activity: Not on file  Other Topics Concern  . Not on file  Social History Narrative   Patient  is married Katherine [name]).   Patient has one child.   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      Patient drinks two cups of coffee every morning.   Regular exercise-  Cory Roughen Do         The PMH, PSH, Social History, Family History, Medications, and allergies have been reviewed in [facility], and have been updated if relevant.  Review of Systems  Constitutional: Positive for fatigue.  Cardiovascular: Negative.   Gastrointestinal: Negative.   Musculoskeletal: Positive for arthralgias.  Neurological: Negative.   Psychiatric/Behavioral: Positive for sleep disturbance. Negative for dysphoric mood and suicidal ideas.  All other systems reviewed and are negative.      Objective:    BP 124/76 (BP Location: Left Arm, Patient Position: Sitting, Cuff Size: Normal)   Pulse 76   Temp 98.4 F (36.9 C) (Oral)   Ht 5\' 6"  (1.676 m)   Wt 145 lb 9.6 oz (66 kg)   SpO2 98%   BMI 23.50 kg/m    Physical Exam        Assessment & Plan:   Connective tissue disease, undifferentiated (HCC)  Insomnia, persistent No follow-ups on file.

## 2018-02-06 NOTE — Patient Instructions (Addendum)
Great to meet you.  Please restart plaquenil and reschedule your appointment with rheumatology.  Start trazodone 1/2 -1 full tablet nightly.    Please schedule a physical in 1 month.

## 2018-02-06 NOTE — Assessment & Plan Note (Signed)
>  25 minutes spent in face to face time with patient, >50% spent in counselling or coordination of care discussing insomnia and her recent rheum diagnosis.  We did discuss that plaquenil is likely the lesser of the evils in terms of long term consequences associated with medications given for rheum disease.  She plan on trying to take it again for more than a week and keeping me updated.  I also advised that she let Dr. Amil Amen know- perhaps they could try a lower dose like 100 mg twice daily instead of 200 mg twice daily. The patient indicates understanding of these issues and agrees with the plan.

## 2018-02-06 NOTE — Assessment & Plan Note (Signed)
She would like to try to wean off of sonata.  Discussed tx options.  She has never tried trazodone. erX sent for trazodone 25-50 mg nightly for insomnia.  Explained to pt that this is a low dose and she may need to titrate up dose for effectiveness and to keep me updated rather than stop taking it if she is not receiving desired results. The patient indicates understanding of these issues and agrees with the plan.

## 2018-02-13 ENCOUNTER — Encounter: Payer: Self-pay | Admitting: Family Medicine

## 2018-02-13 NOTE — Progress Notes (Signed)
[facility]/thx dmf

## 2018-03-01 ENCOUNTER — Telehealth: Payer: 59 | Admitting: Family

## 2018-03-01 DIAGNOSIS — N390 Urinary tract infection, site not specified: Secondary | ICD-10-CM

## 2018-03-01 MED ORDER — CEPHALEXIN 500 MG PO CAPS: 500.0000 mg | ORAL_CAPSULE | Freq: Two times a day (BID) | ORAL | 0 refills | Status: DC

## 2018-03-01 MED FILL — CEPHALEXIN 500 MG CAPSULE: 500 | 7 days supply | Qty: 14 | Fill #0

## 2018-03-01 NOTE — Progress Notes (Signed)

## 2018-03-06 MED FILL — ZALEPLON 10 MG CAPSULE: 10 | 30 days supply | Qty: 30 | Fill #3

## 2018-03-07 MED FILL — VENLAFAXINE HCL ER 37.5 MG: 37.5 | 30 days supply | Qty: 30 | Fill #4

## 2018-03-07 MED FILL — HYDROXYCHLOROQUINE SULFATE: 200 | 30 days supply | Qty: 60 | Fill #1

## 2018-03-15 ENCOUNTER — Encounter: Payer: Self-pay | Admitting: Family Medicine

## 2018-03-15 ENCOUNTER — Ambulatory Visit (INDEPENDENT_AMBULATORY_CARE_PROVIDER_SITE_OTHER): Payer: 59 | Admitting: Family Medicine

## 2018-03-15 VITALS — BP 114/80 | HR 66 | Temp 97.6°F | Ht 65.0 in | Wt 143.8 lb

## 2018-03-15 DIAGNOSIS — E7800 Pure hypercholesterolemia, unspecified: Secondary | ICD-10-CM | POA: Diagnosis not present

## 2018-03-15 DIAGNOSIS — M359 Systemic involvement of connective tissue, unspecified: Secondary | ICD-10-CM

## 2018-03-15 DIAGNOSIS — F411 Generalized anxiety disorder: Secondary | ICD-10-CM

## 2018-03-15 DIAGNOSIS — Z0000 Encounter for general adult medical examination without abnormal findings: Secondary | ICD-10-CM | POA: Diagnosis not present

## 2018-03-15 DIAGNOSIS — E559 Vitamin D deficiency, unspecified: Secondary | ICD-10-CM | POA: Diagnosis not present

## 2018-03-15 DIAGNOSIS — R55 Syncope and collapse: Secondary | ICD-10-CM | POA: Diagnosis not present

## 2018-03-15 DIAGNOSIS — G4700 Insomnia, unspecified: Secondary | ICD-10-CM

## 2018-03-15 LAB — LIPID PANEL
CHOL/HDL RATIO: 2
Cholesterol: 196 mg/dL (ref 0–200)
HDL: 91.9 mg/dL (ref >39.00)
LDL Cholesterol: 92 mg/dL (ref 0–99)
NONHDL: 103.89
Triglycerides: 57 mg/dL (ref 0.0–149.0)
VLDL: 11.4 mg/dL (ref 0.0–40.0)

## 2018-03-15 LAB — VITAMIN D 25 HYDROXY (VIT D DEFICIENCY, FRACTURES): VITD: 46.76 ng/mL (ref 30.00–100.00)

## 2018-03-15 LAB — TSH: TSH: 1.52 u[IU]/mL (ref 0.35–4.50)

## 2018-03-15 MED ORDER — TRAZODONE HCL 50 MG PO TABS: ORAL_TABLET | ORAL | 3 refills | Status: DC

## 2018-03-15 NOTE — Patient Instructions (Signed)
Great to see you. I will call you with your lab results from today and you can view them online.   Let's try taking Trazodone 75 mg -100 mg nightly (1 and 1/2 tablets - 2 tablet nightly). Please update me.

## 2018-03-15 NOTE — Assessment & Plan Note (Signed)
Reviewed preventive care protocols, scheduled due services, and updated immunizations Discussed nutrition, exercise, diet, and healthy lifestyle.  Orders Placed This Encounter  Procedures  . Lipid panel  . TSH  . Vitamin D (25 hydroxy)

## 2018-03-15 NOTE — Assessment & Plan Note (Signed)
Effexor working well for this.  Only taking it twice a week at most.

## 2018-03-15 NOTE — Assessment & Plan Note (Signed)
Continue Effexor.

## 2018-03-15 NOTE — Assessment & Plan Note (Signed)
Check lipid panel today 

## 2018-03-15 NOTE — Assessment & Plan Note (Signed)
Trazodone was only mildly effective.  She is willing to try a higher dose- she will try 75- 100 mg nightly (currently taking 50 mg nightly) and update me in a few weeks.

## 2018-03-15 NOTE — Progress Notes (Signed)
Subjective:   Patient ID: Katherine [name], female    DOB: 07-22-1965, 51 y.o.   MRN: [id number]  Katherine [name] is a pleasant 52 y.o. year old female who presents to clinic today with Annual Exam (Patient is here today for a CPE.  Last PAP 3.5.19 @ [facility].  Last Mammogram on 1.11.19 WNL RTN in 1 year.  Last Colonoscopy 6.24.16 with Diverticulosis RTN 6.24.26.  Immunizations are UTD.  She is currently fasting.  She declines HIV lab draw as it was done on 5.1.1998 when she was pregnant.  )  on 03/15/2018  HPI: Patient established care with me on 02/06/18. Note reviewed.  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 Amil Amen). When she established care with me last month, she admitted that she only tried plaquenil for a week or less because it caused dizziness. She was willing to try it again and told me she would restart it and let me know how it made her feel today. She did restart it and has an appointment with Dr. Amil Amen on 10/4. Less dizziness this time- tolerating it okay but has not noticed any improvement in hard pain.  Insomnia- chronic issue.  Had been weaned off of ambien and to a less habit forming alternative, Sonata which was working well.  When she established with me last month, she wanted to discussed an even less habit forming option.  I therefore started her on trazodone 25-50 mg nightly as needed for insomnia.  Trazodone made her a little sleepy but not "good sleep."  Sonata does seem to be more effective.  Health Maintenance  Topic Date Due  . INFLUENZA VACCINE  04/15/2018 (Originally 02/15/2018)  . MAMMOGRAM  07/29/2019  . PAP SMEAR  09/16/2019  . COLONOSCOPY  01/08/2025  . TETANUS/TDAP  03/08/2026  . Hepatitis C Screening  Completed  . HIV Screening  Completed   Well woman- pap smear done on 09/19/17 by GYN ([facility]) and was wnl. Last mammogram 07/28/17. Colonoscopy 12/2014 - 10 year  recall. Immunizations UTD.  Current Outpatient Medications on File Prior to Visit  Medication Sig Dispense Refill  . acyclovir cream (ZOVIRAX) 5 % Apply topically 5 x daily x 4 days prn lesion 15 g 1  . calcium carbonate (OSCAL) 1500 (600 Ca) MG TABS tablet Take 600 mg of elemental calcium by mouth daily.    . Cyanocobalamin (VITAMIN B-12) 2500 MCG SUBL Place under the tongue daily.    . hydroxychloroquine (PLAQUENIL) 200 MG tablet Take 1 tablet by mouth 2 (two) times daily.  3  . ibuprofen (ADVIL,MOTRIN) 200 MG tablet Take 400 mg by mouth every 6 (six) hours as needed.    . Misc Natural Products (NARCOSOFT HERBAL LAX PO) Take by mouth.    . Multiple Vitamins-Minerals (CENTRUM SILVER PO) Take by mouth daily.    . Probiotic Product (PROBIOTIC DAILY PO) Take by mouth.    . traZODone (DESYREL) 50 MG tablet Take 0.5-1 tablets (25-50 mg total) by mouth at bedtime as needed for sleep. 30 tablet 3  . venlafaxine XR (EFFEXOR-XR) 37.5 MG 24 hr capsule Take 1 capsule (37.5 mg total) by mouth at bedtime. 30 capsule 5  . zaleplon (SONATA) 10 MG capsule TAKE 1 CAPSULE BY MOUTH AT BEDTIME AS NEEDED FOR SLEEP 30 capsule 5   No current facility-administered medications on file prior to visit.     Allergies  Allergen Reactions  . Ultram [Tramadol] Nausea And  Vomiting  . Codeine Itching and Nausea And Vomiting  . Hydrocodone-Acetaminophen     REACTION: NAUSEA  . Oxycodone-Acetaminophen     REACTION: NAUSEA    Past Medical History:  Diagnosis Date  . Atypical lobular hyperplasia of left breast   . Heart murmur    ECHO 2012-trivial regurg-all else normal  . History of chicken pox   . Insomnia   . 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 Insomnia, persistent 08/28/2013   Menopausal induced insomnia, early morning awakenings, and snoring.   . Low back pain 05/10/2017  . Paresthesias 05/09/2017  . Plantar fasciitis   . PONV (postoperative nausea and  vomiting)   . Preventative health care 03/21/2016  . Pure hypercholesterolemia   . Raynaud's syndrome   . 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 BREAST EXCISIONAL BIOPSY Left   . BREAST SURGERY  11/20/13   left breast; Dr Donne Hazel  . CESAREAN SECTION  1999  . plantar fasciitis surgery  2011, 2007   both feet  . TONSILLECTOMY AND ADENOIDECTOMY  1981  . uterine fibroidectomy     @ c section    Family History  Adopted: Yes  Problem Relation Age of Onset  . Heart attack Maternal Uncle 37  . Heart disease Maternal Uncle 37       MI  . Cervical cancer Maternal Grandmother   . Heart Problems Maternal Grandmother        arrhythmia  . Cancer Maternal Grandmother        gyn  . Stroke Mother        in 75s  . Diabetes Mother   . Heart disease Mother   . Hyperlipidemia Mother   . Hyperlipidemia Brother   . Cancer Maternal Aunt        numerous breast cancers  . Depression Brother        1/2 brother  . Hypertension Unknown   . Hypertension Paternal Grandmother   . Hypertension Paternal Grandfather     Social History   Socioeconomic History  . Marital status: Married    Spouse name: [name]  . Number of children: 1  . Years of education: BSN  . Highest education level: Not on file  Occupational History  . Occupation: Palliative Care Nurse     Employer: [employer]  . Financial resource strain: Not on file  . Food insecurity:    Worry: Not on file    Inability: Not on file  . Transportation needs:    Medical: Not on file    Non-medical: Not on file  Tobacco Use  . Smoking status: Former Smoker    Last attempt to quit: 10/16/1991    Years since quitting: 26.4  . Smokeless tobacco: Never Used  . Tobacco comment: smoked 1981-1993, up to 1 ppd  Substance and Sexual Activity  . Alcohol use: Yes    Comment: occasionally   . Drug use: No  . 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 Physical activity:    Days per week: Not on file    Minutes per session: Not on file  . Stress: Not on file  Relationships  . Social connections:    Talks on phone: Not on file  Gets together: Not on file    Attends religious service: Not on file    Active member of club or organization: Not on file    Attends meetings of clubs or organizations: Not on file    Relationship status: Not on file  . Intimate partner violence:    Fear of current or ex partner: Not on file    Emotionally abused: Not on file    Physically abused: Not on file    Forced sexual activity: Not on file  Other Topics Concern  . Not on file  Social History Narrative   [name]).   Patient has one child.   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      Patient drinks two cups of coffee every morning.   Regular exercise- Tae Kwon Do         The PMH, PSH, Social History, Family History, Medications, and allergies have been reviewed in [facility], and have been updated if relevant.  Review of Systems  Constitutional: Positive for fatigue.  HENT: Negative.   Eyes: Negative.   Respiratory: Negative.   Cardiovascular: Negative.   Gastrointestinal: Negative.   Endocrine: Negative.   Genitourinary: Negative.   Musculoskeletal: Positive for arthralgias and myalgias.  Allergic/Immunologic: Negative.   Neurological: Negative.   Hematological: Negative.   Psychiatric/Behavioral: Negative.   All other systems reviewed and are negative.      Objective:    BP 114/80 (BP Location: Left Arm, Patient Position: Sitting, Cuff Size: Normal)   Pulse 66   Temp 97.6 F (36.4 C) (Oral)   Ht 5\' 5"  (1.651 m)   Wt 143 lb 12.8 oz (65.2 kg)   SpO2 99%   BMI 23.93 kg/m    Physical Exam  Constitutional: She is oriented to person, place, and time. She appears well-developed and  well-nourished. No distress.  HENT:  Head: Normocephalic and atraumatic.  Eyes: EOM are normal.  Neck: Normal range of motion.  Cardiovascular: Normal rate and regular rhythm.  Pulmonary/Chest: Effort normal and breath sounds normal.  Abdominal: Soft. Bowel sounds are normal.  Musculoskeletal: Normal range of motion. She exhibits no edema.  Neurological: She is alert and oriented to person, place, and time. No cranial nerve deficit.  Skin: Skin is warm and dry. She is not diaphoretic.  Psychiatric: She has a normal mood and affect. Her behavior is normal. Judgment and thought content normal.  Nursing note and vitals reviewed.         Assessment & Plan:   Vitamin D deficiency  Well woman exam without gynecological exam  Pure hypercholesterolemia  Insomnia, persistent  Anxiety state No follow-ups on file.

## 2018-03-15 NOTE — Assessment & Plan Note (Signed)
Followed by rheumatology. She has restarted plaquenil and is tolerating this okay so far.

## 2018-03-15 NOTE — Assessment & Plan Note (Signed)
Recheck Vit D today.  If low, could be contributing to her joint pain. The patient indicates understanding of these issues and agrees with the plan.

## 2018-04-10 ENCOUNTER — Encounter: Payer: Self-pay | Admitting: Family Medicine

## 2018-04-11 ENCOUNTER — Other Ambulatory Visit: Payer: Self-pay

## 2018-04-19 MED FILL — ZALEPLON 10 MG CAPSULE: 10 | 30 days supply | Qty: 30 | Fill #4

## 2018-04-19 MED FILL — traZODone HCL 50 MG TABS: 50 | 30 days supply | Qty: 30 | Fill #1

## 2018-05-09 MED FILL — HYDROXYCHLOROQUINE SULFATE: 200 | 30 days supply | Qty: 60 | Fill #2

## 2018-05-17 DIAGNOSIS — M2550 Pain in unspecified joint: Secondary | ICD-10-CM | POA: Diagnosis not present

## 2018-05-17 DIAGNOSIS — Z6823 Body mass index (BMI) 23.0-23.9, adult: Secondary | ICD-10-CM | POA: Diagnosis not present

## 2018-05-17 DIAGNOSIS — I7300 Raynaud's syndrome without gangrene: Secondary | ICD-10-CM | POA: Diagnosis not present

## 2018-05-17 DIAGNOSIS — M359 Systemic involvement of connective tissue, unspecified: Secondary | ICD-10-CM | POA: Diagnosis not present

## 2018-06-08 DIAGNOSIS — H52201 Unspecified astigmatism, right eye: Secondary | ICD-10-CM | POA: Diagnosis not present

## 2018-06-08 DIAGNOSIS — M359 Systemic involvement of connective tissue, unspecified: Secondary | ICD-10-CM | POA: Diagnosis not present

## 2018-06-08 DIAGNOSIS — H00025 Hordeolum internum left lower eyelid: Secondary | ICD-10-CM | POA: Diagnosis not present

## 2018-06-08 DIAGNOSIS — H5213 Myopia, bilateral: Secondary | ICD-10-CM | POA: Diagnosis not present

## 2018-06-08 DIAGNOSIS — Z79899 Other long term (current) drug therapy: Secondary | ICD-10-CM | POA: Diagnosis not present

## 2018-06-08 DIAGNOSIS — H524 Presbyopia: Secondary | ICD-10-CM | POA: Diagnosis not present

## 2018-06-08 MED FILL — OFLOXACIN 0.3% EYE DROPS: 0.3 | 7 days supply | Qty: 5 | Fill #0

## 2018-06-12 ENCOUNTER — Other Ambulatory Visit: Payer: Self-pay | Admitting: Internal Medicine

## 2018-06-12 DIAGNOSIS — G4700 Insomnia, unspecified: Secondary | ICD-10-CM

## 2018-06-12 MED FILL — ZALEPLON 10 MG CAPSULE: 10 | 30 days supply | Qty: 30 | Fill #0

## 2018-06-13 MED FILL — HYDROXYCHLOROQUINE SULFATE: 200 | 30 days supply | Qty: 60 | Fill #3

## 2018-06-13 MED FILL — traZODone HCL 50 MG TABS: 50 | 30 days supply | Qty: 30 | Fill #2

## 2018-08-03 ENCOUNTER — Other Ambulatory Visit: Payer: Self-pay | Admitting: Obstetrics and Gynecology

## 2018-08-03 DIAGNOSIS — Z1231 Encounter for screening mammogram for malignant neoplasm of breast: Secondary | ICD-10-CM

## 2018-08-03 MED FILL — HYDROXYCHLOROQUINE SULFATE: 200 | 30 days supply | Qty: 60 | Fill #0

## 2018-08-03 MED FILL — ZALEPLON 10 MG CAPSULE: 10 | 30 days supply | Qty: 30 | Fill #1

## 2018-08-30 DIAGNOSIS — I7300 Raynaud's syndrome without gangrene: Secondary | ICD-10-CM | POA: Diagnosis not present

## 2018-08-30 DIAGNOSIS — M359 Systemic involvement of connective tissue, unspecified: Secondary | ICD-10-CM | POA: Diagnosis not present

## 2018-08-30 DIAGNOSIS — M2550 Pain in unspecified joint: Secondary | ICD-10-CM | POA: Diagnosis not present

## 2018-08-30 DIAGNOSIS — G629 Polyneuropathy, unspecified: Secondary | ICD-10-CM | POA: Diagnosis not present

## 2018-08-30 DIAGNOSIS — Z6824 Body mass index (BMI) 24.0-24.9, adult: Secondary | ICD-10-CM | POA: Diagnosis not present

## 2018-08-31 ENCOUNTER — Ambulatory Visit
Admission: RE | Admit: 2018-08-31 | Discharge: 2018-08-31 | Disposition: A | Discharge status: Home / Self Care | Payer: 59 | Source: Ambulatory Visit | Attending: Obstetrics and Gynecology | Admitting: Obstetrics and Gynecology

## 2018-08-31 DIAGNOSIS — Z1231 Encounter for screening mammogram for malignant neoplasm of breast: Secondary | ICD-10-CM

## 2018-09-25 MED FILL — HYDROXYCHLOROQUINE SULFATE: 200 | 30 days supply | Qty: 60 | Fill #1

## 2018-09-25 MED FILL — traZODone HCL 50 MG TABS: 50 | 30 days supply | Qty: 30 | Fill #3

## 2018-09-25 MED FILL — ZALEPLON 10 MG CAPS: 10 | 30 days supply | Qty: 30 | Fill #2

## 2018-11-05 MED FILL — HYDROXYCHLOROQUINE SULFATE: 200 | 30 days supply | Qty: 60 | Fill #2

## 2018-11-05 MED FILL — ZALEPLON 10 MG CAPS: 10 | 30 days supply | Qty: 30 | Fill #3

## 2018-11-23 ENCOUNTER — Telehealth: Payer: Self-pay | Admitting: Family Medicine

## 2018-11-23 NOTE — Telephone Encounter (Signed)
Called to see if pt would like to schedule CPE out for August, left message

## 2018-11-27 DIAGNOSIS — G629 Polyneuropathy, unspecified: Secondary | ICD-10-CM | POA: Diagnosis not present

## 2018-11-27 DIAGNOSIS — M359 Systemic involvement of connective tissue, unspecified: Secondary | ICD-10-CM | POA: Diagnosis not present

## 2018-11-27 DIAGNOSIS — I7300 Raynaud's syndrome without gangrene: Secondary | ICD-10-CM | POA: Diagnosis not present

## 2018-11-27 DIAGNOSIS — M2550 Pain in unspecified joint: Secondary | ICD-10-CM | POA: Diagnosis not present

## 2018-11-27 MED FILL — predniSONE 5 MG TABS: 5 | 12 days supply | Qty: 48 | Fill #0

## 2018-12-05 ENCOUNTER — Telehealth: Payer: 59 | Admitting: Family

## 2018-12-05 DIAGNOSIS — R399 Unspecified symptoms and signs involving the genitourinary system: Secondary | ICD-10-CM

## 2018-12-05 MED ORDER — CEPHALEXIN 500 MG PO CAPS: 500.0000 mg | ORAL_CAPSULE | Freq: Two times a day (BID) | ORAL | 0 refills | Status: DC

## 2018-12-05 NOTE — Progress Notes (Signed)

## 2018-12-09 ENCOUNTER — Telehealth: Payer: 59 | Admitting: Family

## 2018-12-09 DIAGNOSIS — R300 Dysuria: Secondary | ICD-10-CM

## 2018-12-09 NOTE — Progress Notes (Signed)
Based on what you shared with me, I feel your condition warrants further evaluation and I recommend that you be seen for a face to face office visit.     NOTE: If you entered your credit card information for this eVisit, you will not be charged. You may see a "hold" on your card for the $35 but that hold will drop off and you will not have a charge processed.  If you are having a true medical emergency please call 911.  If you need an urgent face to face visit, [facility] has four urgent care centers for your convenience.    PLEASE NOTE: THE [facility] LOCATIONS AND URGENT CARE CLINICS DO NOT HAVE THE TESTING FOR CORONAVIRUS COVID19 AVAILABLE.  IF YOU FEEL YOU NEED THIS TEST YOU MUST GO TO A TRIAGE LOCATION AT [facility]   [email or web address] to reserve your spot online an avoid wait times  [facility] [address] [place], [place] [address] Modified hours of operation: Monday-Friday, 12 PM to 6 PM  Saturday & Sunday 10 AM to 4 PM *Across the street from [facility] (New Address!) [address] [place], [place] [address] *Just off [address], across the road from [place] hours of operation: Monday-Friday, 12 PM to 6 PM  Closed Saturday & Sunday  [facility]'s modified hours of operation will be in effect from May 1 until May 31   The following sites will take your insurance:  . [facility] [place] a Provider at this Location  [address] [place], [place] [address] . 10 am to 8 pm Monday-Friday . 12 pm to 8 pm Saturday-Sunday   . [facility] [place] a Provider at this Location  [address] [place], [place], [place] [address] . 8 am to 8 pm Monday-Friday . 9 am to 6 pm Saturday . 11 am to 6 pm Sunday   . [facility]  Health Urgent [facility] at [place] Get Driving Directions  [address] Suite [place], [place] [address] . 8 am to 8 pm Monday-Friday . 8 am to 4 pm Saturday-Sunday   Your e-visit answers were reviewed by a board certified advanced clinical practitioner to complete your personal care plan.  Thank you for using e-Visits.  Greater than 5 minutes, yet less than 10 minutes of time have been spent researching, coordinating, and implementing care for this patient today.  Thank you for the details you included in the comment boxes. Those details are very helpful in determining the best course of treatment for you and help [place] to provide the best care.

## 2018-12-11 ENCOUNTER — Telehealth: Payer: Self-pay | Admitting: Family Medicine

## 2018-12-11 DIAGNOSIS — M069 Rheumatoid arthritis, unspecified: Secondary | ICD-10-CM | POA: Diagnosis not present

## 2018-12-11 DIAGNOSIS — M359 Systemic involvement of connective tissue, unspecified: Secondary | ICD-10-CM | POA: Diagnosis not present

## 2018-12-11 DIAGNOSIS — M329 Systemic lupus erythematosus, unspecified: Secondary | ICD-10-CM | POA: Diagnosis not present

## 2018-12-11 DIAGNOSIS — Z79899 Other long term (current) drug therapy: Secondary | ICD-10-CM | POA: Diagnosis not present

## 2018-12-11 NOTE — Telephone Encounter (Signed)
Pt called to schedule an appt, I set up for tomorrow at 12 for a mychart visit, I let her know the nurse would contact her before hand and she told me she will be seeing a pt tomorrow between 11 and 12 and said she might not be able to answer in between those times so wasn't sure if you wanted to call her earlier or not

## 2018-12-12 ENCOUNTER — Other Ambulatory Visit (INDEPENDENT_AMBULATORY_CARE_PROVIDER_SITE_OTHER): Payer: 59

## 2018-12-12 ENCOUNTER — Telehealth (INDEPENDENT_AMBULATORY_CARE_PROVIDER_SITE_OTHER): Payer: 59 | Admitting: Family Medicine

## 2018-12-12 DIAGNOSIS — R300 Dysuria: Secondary | ICD-10-CM | POA: Diagnosis not present

## 2018-12-12 DIAGNOSIS — R102 Pelvic and perineal pain: Secondary | ICD-10-CM

## 2018-12-12 DIAGNOSIS — N3000 Acute cystitis without hematuria: Secondary | ICD-10-CM | POA: Diagnosis not present

## 2018-12-12 DIAGNOSIS — R1024 Suprapubic pain: Secondary | ICD-10-CM

## 2018-12-12 DIAGNOSIS — N390 Urinary tract infection, site not specified: Secondary | ICD-10-CM | POA: Insufficient documentation

## 2018-12-12 LAB — URINALYSIS, ROUTINE W REFLEX MICROSCOPIC
Bilirubin Urine: NEGATIVE
Hgb urine dipstick: NEGATIVE
Ketones, ur: NEGATIVE
Leukocytes,Ua: NEGATIVE
Nitrite: NEGATIVE
RBC / HPF: NONE SEEN (ref 0–?)
Specific Gravity, Urine: <=1.005 — AB (ref 1.000–1.030)
Total Protein, Urine: NEGATIVE
Urine Glucose: NEGATIVE
Urobilinogen, UA: 0.2 (ref 0.0–1.0)
pH: 5.5 (ref 5.0–8.0)

## 2018-12-12 NOTE — Assessment & Plan Note (Signed)
Overall symptoms are resolving.  I do want to get a UA and urine cx to make sure that we don't have a resistant strain although Keflex does appear to be effective.  Orders entered- she works at [employer] and will have them done there today. Push fluids. Call or send my chart message prn if these symptoms worsen or fail to improve as anticipated. The patient indicates understanding of these issues and agrees with the plan. Orders Placed This Encounter  Procedures  . Urine Culture  . Urinalysis, Routine w reflex microscopic

## 2018-12-12 NOTE — Progress Notes (Signed)
Virtual Visit via Video   Due to the COVID-19 pandemic, this visit was completed with telemedicine (audio/video) technology to reduce patient and provider exposure as well as to preserve personal protective equipment.   I connected with Katherine [name] by a video enabled telemedicine application and verified that I am speaking with the correct person using two identifiers. Location patient: Home Location provider: [facility], Office Persons participating in the virtual visit: [name], Arnette Norris, MD   I discussed the limitations of evaluation and management by telemedicine and the availability of in person appointments. The patient expressed understanding and agreed to proceed.  Care Team   Patient Care Team: Lucille Passy, MD as PCP - General (Family Medicine) Olga Millers, MD as Consulting Physician (Obstetrics and Gynecology) Ronald Lobo, MD as Consulting Physician (Gastroenterology)  Subjective:   HPI:  ?UTI- Dysuria, urgency, frequency, pelvic pain between 2-7d but afebrile. Had an e-visit with Dutch Quint, NFP on 5.20.20 and Dx with UTI and Rx'ed Cephalexin 500mg  bid and on 5.24.20 she still had 3 days remaining and stated "I don't believe this is taking care of it." She was advised that she needed more in depth visit.   She did finish the dose.  Some of her symptoms have resolved.  She still does have some urgency.   Has not fevers or nausea.   Review of Systems  Constitutional: Negative.   Genitourinary: Positive for frequency and urgency. Negative for dysuria, flank pain and hematuria.  Musculoskeletal: Negative.   Neurological: Negative.   Psychiatric/Behavioral: Negative.   All other systems reviewed and are negative.    Patient Active Problem List   Diagnosis Date Noted  . Well woman exam without gynecological exam 03/15/2018  . Connective tissue disease, undifferentiated ([place]) 02/06/2018  . 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 Paresthesias 05/09/2017   . Dense breast tissue 01/30/2016  . Estrogen deficiency 07/11/2015  . Vasomotor instability 07/11/2015  . History of chicken pox   . Atypical lobular hyperplasia of left breast   . Vitamin D deficiency 01/01/2014  . Other abnormal glucose 12/24/2013  . 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  . Insomnia, persistent 08/28/2013  . FATIGUE 03/03/2009  . PALPITATIONS 03/03/2009  . Anxiety state 11/30/2007  . PURE HYPERCHOLESTEROLEMIA 11/29/2007  . 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  . [place], [place] 11/29/2007  . HEART MURMUR, HX OF 11/29/2007    Social History   Tobacco Use  . Smoking status: Former Smoker    Last attempt to quit: 10/16/1991    Years since quitting: 27.1  . Smokeless tobacco: Never Used  . Tobacco comment: smoked 1981-1993, up to 1 ppd  Substance Use Topics  . Alcohol use: Yes    Comment: occasionally     Current Outpatient Medications:  .  acyclovir cream (ZOVIRAX) 5 %, Apply topically 5 x daily x 4 days prn lesion, Disp: 15 g, Rfl: 1 .  calcium carbonate (OSCAL) 1500 (600 Ca) MG TABS tablet, Take 600 mg of elemental calcium by mouth daily., Disp: , Rfl:  .  cephALEXin (KEFLEX) 500 MG capsule, Take 1 capsule (500 mg total) by mouth 2 (two) times daily., Disp: 14 capsule, Rfl: 0 .  Cyanocobalamin (VITAMIN B-12) 2500 MCG SUBL, Place under the tongue daily., Disp: , Rfl:  .  hydroxychloroquine (PLAQUENIL) 200 MG tablet, Take 1 tablet by mouth 2 (two) times daily., Disp: , Rfl: 3 .  ibuprofen (ADVIL,MOTRIN) 200 MG tablet, Take 400 mg by mouth every 6 (six) hours  as needed., Disp: , Rfl:  .  Misc Natural Products (NARCOSOFT HERBAL LAX PO), Take by mouth., Disp: , Rfl:  .  Multiple Vitamins-Minerals (CENTRUM SILVER PO), Take by mouth daily., Disp: , Rfl:  .  Probiotic Product (PROBIOTIC DAILY PO), Take by mouth., Disp: , Rfl:  .  traZODone (DESYREL) 50 MG tablet, 1.5 tablets - 2 tablets nightly as needed for insomnia, Disp: 60 tablet, Rfl: 3 .  zaleplon (SONATA) 10 MG  capsule, TAKE 1 CAPSULE BY MOUTH AT BEDTIME AS NEEDED FOR SLEEP, Disp: 30 capsule, Rfl: 5  Allergies  Allergen Reactions  . Ultram [Tramadol] Nausea And Vomiting  . Codeine Itching and Nausea And Vomiting  . Hydrocodone-Acetaminophen     REACTION: NAUSEA  . Oxycodone-Acetaminophen     REACTION: NAUSEA    Objective:   VITALS: Per patient if applicable, see vitals. GENERAL: Alert, appears well and in no acute distress. HEENT: Atraumatic, conjunctiva clear, no obvious abnormalities on inspection of external nose and ears. NECK: Normal movements of the head and neck. CARDIOPULMONARY: No increased WOB. Speaking in clear sentences. I:E ratio WNL.  MS: Moves all visible extremities without noticeable abnormality. PSYCH: Pleasant and cooperative, well-groomed. Speech normal rate and rhythm. Affect is appropriate. Insight and judgement are appropriate. Attention is focused, linear, and appropriate.  NEURO: CN grossly intact. Oriented as arrived to appointment on time with no prompting. Moves both UE equally.  SKIN: No obvious lesions, wounds, erythema, or cyanosis noted on face or hands.  Depression screen PHQ 2/9 02/06/2018  Decreased Interest 0  Down, Depressed, Hopeless 0  PHQ - 2 Score 0    Assessment and Plan:   Maridel was seen today for dysuria.  Diagnoses and all orders for this visit:  Dysuria -     Urinalysis, Routine w reflex microscopic; Future -     Urine Culture; Future  Hypogastric pain -     Urinalysis, Routine w reflex microscopic; Future -     Urine Culture; Future    . COVID-19 Education: The signs and symptoms of COVID-19 were discussed with the patient and how to seek care for testing if needed. The importance of social distancing was discussed today. . Reviewed expectations re: course of current medical issues. . Discussed self-management of symptoms. . Outlined signs and symptoms indicating need for more acute intervention. . Patient verbalized  understanding and all questions were answered. Marland Kitchen Health Maintenance issues including appropriate healthy diet, exercise, and smoking avoidance were discussed with patient. . See orders for this visit as documented in the electronic medical record.  Arnette Norris, MD  Records requested if needed. Time spent: 15 minutes, of which >50% was spent in obtaining information about her symptoms, reviewing her previous labs, evaluations, and treatments, counseling her about her condition (please see the discussed topics above), and developing a plan to further investigate it; she had a number of questions which I addressed.

## 2018-12-13 ENCOUNTER — Other Ambulatory Visit: Payer: Self-pay | Admitting: Family Medicine

## 2018-12-13 DIAGNOSIS — G4700 Insomnia, unspecified: Secondary | ICD-10-CM

## 2018-12-13 LAB — URINE CULTURE
MICRO NUMBER:: 509998
Result:: NO GROWTH
SPECIMEN QUALITY:: ADEQUATE

## 2018-12-13 MED FILL — HYDROXYCHLOROQUINE 200 MG: 200 | 30 days supply | Qty: 60 | Fill #0

## 2018-12-13 MED FILL — ZALEPLON 10 MG CAPS: 10 | 30 days supply | Qty: 30 | Fill #0

## 2019-01-04 ENCOUNTER — Telehealth: Payer: Self-pay | Admitting: Family Medicine

## 2019-01-04 NOTE — Telephone Encounter (Signed)
Patient would like a call back to schedule an annual CPE with pcp Deborra Medina. Please call patient back, last CPE was 03/15/18.

## 2019-01-07 NOTE — Telephone Encounter (Signed)
Tried to call had to leave message

## 2019-01-14 NOTE — Telephone Encounter (Signed)
We still haven't received anything back from pt so I tried to call again but voicemail is full this time

## 2019-01-20 MED FILL — ZALEPLON 10 MG CAPS: 10 | 30 days supply | Qty: 30 | Fill #1

## 2019-01-21 DIAGNOSIS — Z124 Encounter for screening for malignant neoplasm of cervix: Secondary | ICD-10-CM | POA: Diagnosis not present

## 2019-01-21 DIAGNOSIS — Z01419 Encounter for gynecological examination (general) (routine) without abnormal findings: Secondary | ICD-10-CM | POA: Diagnosis not present

## 2019-01-21 MED FILL — HYDROXYCHLOROQUINE 200 MG T: 200 | 30 days supply | Qty: 60 | Fill #1

## 2019-01-31 MED FILL — HYDROXYCHLOROQUINE 200 MG T: 200 | 30 days supply | Qty: 60 | Fill #1

## 2019-01-31 MED FILL — ZALEPLON 10 MG CAPS: 10 | 30 days supply | Qty: 30 | Fill #1

## 2019-03-05 DIAGNOSIS — I7300 Raynaud's syndrome without gangrene: Secondary | ICD-10-CM | POA: Diagnosis not present

## 2019-03-05 DIAGNOSIS — M2550 Pain in unspecified joint: Secondary | ICD-10-CM | POA: Diagnosis not present

## 2019-03-05 DIAGNOSIS — Z6823 Body mass index (BMI) 23.0-23.9, adult: Secondary | ICD-10-CM | POA: Diagnosis not present

## 2019-03-05 DIAGNOSIS — M7989 Other specified soft tissue disorders: Secondary | ICD-10-CM | POA: Diagnosis not present

## 2019-03-05 DIAGNOSIS — G629 Polyneuropathy, unspecified: Secondary | ICD-10-CM | POA: Diagnosis not present

## 2019-03-05 DIAGNOSIS — M359 Systemic involvement of connective tissue, unspecified: Secondary | ICD-10-CM | POA: Diagnosis not present

## 2019-03-05 MED FILL — predniSONE 5 MG TABS: 5 | 30 days supply | Qty: 30 | Fill #0

## 2019-03-07 MED FILL — GABAPENTIN 100 MG CAPSULE: 100 | 45 days supply | Qty: 90 | Fill #0

## 2019-03-07 MED FILL — ZALEPLON 10 MG CAPS: 10 | 30 days supply | Qty: 30 | Fill #2

## 2019-04-08 MED FILL — ZALEPLON 10 MG CAPS: 10 | 30 days supply | Qty: 30 | Fill #3

## 2019-04-08 MED FILL — HYDROXYCHLOROQUINE 200 MG T: 200 | 30 days supply | Qty: 60 | Fill #2

## 2019-04-10 ENCOUNTER — Other Ambulatory Visit: Payer: Self-pay

## 2019-04-10 MED ORDER — PREDNISONE 50 MG PO TABS: ORAL_TABLET | ORAL | 0 refills | Status: DC

## 2019-04-10 MED FILL — predniSONE 50 MG TABS: 50 | 5 days supply | Qty: 5 | Fill #0

## 2019-04-16 ENCOUNTER — Encounter: Payer: Self-pay | Admitting: Family Medicine

## 2019-04-16 ENCOUNTER — Ambulatory Visit (INDEPENDENT_AMBULATORY_CARE_PROVIDER_SITE_OTHER): Payer: 59 | Admitting: Family Medicine

## 2019-04-16 ENCOUNTER — Other Ambulatory Visit: Payer: Self-pay

## 2019-04-16 VITALS — BP 114/78 | HR 76 | Temp 98.0°F | Ht 65.0 in | Wt 144.6 lb

## 2019-04-16 DIAGNOSIS — Z0000 Encounter for general adult medical examination without abnormal findings: Secondary | ICD-10-CM

## 2019-04-16 DIAGNOSIS — M359 Systemic involvement of connective tissue, unspecified: Secondary | ICD-10-CM

## 2019-04-16 DIAGNOSIS — G4700 Insomnia, unspecified: Secondary | ICD-10-CM | POA: Diagnosis not present

## 2019-04-16 DIAGNOSIS — N6092 Unspecified benign mammary dysplasia of left breast: Secondary | ICD-10-CM

## 2019-04-16 DIAGNOSIS — C50912 Malignant neoplasm of unspecified site of left female breast: Secondary | ICD-10-CM | POA: Diagnosis not present

## 2019-04-16 DIAGNOSIS — G4730 Sleep apnea, unspecified: Secondary | ICD-10-CM

## 2019-04-16 DIAGNOSIS — Z79899 Other long term (current) drug therapy: Secondary | ICD-10-CM

## 2019-04-16 DIAGNOSIS — E7800 Pure hypercholesterolemia, unspecified: Secondary | ICD-10-CM | POA: Diagnosis not present

## 2019-04-16 DIAGNOSIS — E559 Vitamin D deficiency, unspecified: Secondary | ICD-10-CM

## 2019-04-16 NOTE — Assessment & Plan Note (Signed)
Due for labs today. 

## 2019-04-16 NOTE — Assessment & Plan Note (Addendum)
Followed by rheumatology, Dr. Amil Amen. On plaquenil but does seem to have significant changes in her hands since I last saw her.  Multiple trigger fingers, joint swelling, stiffness, cannot fully bend her hand. I did advise her to start MTX/folate as I am concerned about permanent loss of function.  She also requests referral for a second opinion which I feel is very reasonable. Referral placed.

## 2019-04-16 NOTE — Patient Instructions (Signed)
Great to see you. Talk to Dr. Tamala Julian about what we discussed today. I did go ahead and place an urgent referral for a second opinion from another rheumatologist.

## 2019-04-16 NOTE — Assessment & Plan Note (Signed)
Mammogram UTD- finished course of tamoxifen in 2018.

## 2019-04-16 NOTE — Assessment & Plan Note (Signed)
Reviewed preventive care protocols, scheduled due services, and updated immunizations Discussed nutrition, exercise, diet, and healthy lifestyle.  

## 2019-04-16 NOTE — Progress Notes (Signed)
Subjective:   Patient ID: Katherine [name], female    DOB: 05-25-1966, 53 y.o.   MRN: [id number]  Katherine [name] is a pleasant 53 y.o. year old female who presents to clinic today with Annual Exam (Pt is here today for a CPE without PAP.  She declines flu shot from work. She is not currently fasting and if orders are placed she will get them done at work. She had PAP April by Dr. Freda Munro. She will need referral for January for Mammogram at "[facility].")  on 04/16/2019  HPI: Patient established care with me on 02/06/18. Note reviewed.  Depression screen PHQ 2/9 02/06/2018  Decreased Interest 0  Down, Depressed, Hopeless 0  PHQ - 2 Score 0    Health Maintenance  Topic Date Due   INFLUENZA VACCINE  04/23/2019 (Originally 02/16/2019)   PAP SMEAR-Modifier  09/16/2019   MAMMOGRAM  08/31/2020   COLONOSCOPY  01/08/2025   TETANUS/TDAP  03/08/2026   Hepatitis C Screening  Completed   HIV Screening  Completed   Well woman- pap smear done on 01/21/19 GYN ([facility]) and was wnl. Last mammogram 08/31/18.  H/o of breast CA- completed tamoxifen in 2018.  Colonoscopy 12/2014 - 10 year recall.  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 Amil Amen).  Was last seen on 03/06/19  by Dr. Amil Amen.     On Plaquenil 200 mg - 1 tablet twice daily.  She could not tolerate prednisone ("makes me crazy.").  Has not had an EKG done. He did offer MTX.  Per pt, he was told hand xray was normal.   She is having worsening hand pain and stiffness.     Insomnia- chronic issue.  Had been weaned off of ambien and to a less habit forming alternative, Sonata which was working well.  When she established with me last year, she wanted to discussed an even less habit forming option.  I therefore started her on trazodone 25-50 mg nightly as needed for insomnia.  Trazodone made her a little sleepy but not "good sleep."  Sonata  does seem to be more effective so she stopped taking trazodone.  Lab Results  Component Value Date   CHOL 196 03/15/2018   HDL 91.90 03/15/2018   LDLCALC 92 03/15/2018   TRIG 57.0 03/15/2018   CHOLHDL 2 03/15/2018   Lab Results  Component Value Date   ALT 14 11/17/2017   AST 23 11/17/2017   ALKPHOS 85 11/17/2017   BILITOT 0.5 05/11/2017   Lab Results  Component Value Date   WBC 4.6 11/14/2017   HGB 14.1 11/14/2017   HCT 42 11/14/2017   MCV 94.2 05/11/2017   PLT 247 11/14/2017   Lab Results  Component Value Date   TSH 1.52 03/15/2018    Current Outpatient Medications on File Prior to Visit  Medication Sig Dispense Refill   acyclovir cream (ZOVIRAX) 5 % Apply topically 5 x daily x 4 days prn lesion 15 g 1   calcium carbonate (OSCAL) 1500 (600 Ca) MG TABS tablet Take 600 mg of elemental calcium by mouth daily.     Cyanocobalamin (VITAMIN B-12) 2500 MCG SUBL Place under the tongue daily.     gabapentin (NEURONTIN) 100 MG capsule 100 mg. Take 1-2qhs prn     hydroxychloroquine (PLAQUENIL) 200 MG tablet Take 1 tablet by mouth 2 (two) times daily.  3   ibuprofen (ADVIL,MOTRIN) 200 MG tablet Take 400 mg by mouth  every 6 (six) hours as needed.     Misc Natural Products (NARCOSOFT HERBAL LAX PO) Take by mouth.     Multiple Vitamins-Minerals (CENTRUM SILVER PO) Take by mouth daily.     Probiotic Product (PROBIOTIC DAILY PO) Take by mouth.     zaleplon (SONATA) 10 MG capsule TAKE 1 CAPSULE BY MOUTH AT BEDTIME AS NEEDED FOR SLEEP 30 capsule 5   No current facility-administered medications on file prior to visit.     Allergies  Allergen Reactions   Ultram [Tramadol] Nausea And Vomiting   Codeine Itching and Nausea And Vomiting   Hydrocodone-Acetaminophen     REACTION: NAUSEA   Oxycodone-Acetaminophen     REACTION: NAUSEA    Past Medical History:  Diagnosis Date   Atypical lobular hyperplasia of left breast    Heart murmur    ECHO 2012-trivial regurg-all  else normal   History of chicken pox    Insomnia    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    Past Surgical History:  Procedure Laterality Date   BREAST EXCISIONAL BIOPSY Left    BREAST SURGERY  11/20/13   left breast; Dr Lillia Corporal SECTION  1999   plantar fasciitis surgery  2011, 2007   both feet   TONSILLECTOMY AND ADENOIDECTOMY  1981   uterine fibroidectomy     @ c section    Family History  Adopted: Yes  Problem Relation Age of Onset   Heart attack Maternal Uncle 48   Heart disease Maternal Uncle 37       MI   Cervical cancer Maternal Grandmother    Heart Problems Maternal Grandmother        arrhythmia   Cancer Maternal Grandmother        gyn   Stroke Mother        in 32s   Diabetes Mother    Heart disease Mother    Hyperlipidemia Mother    Hyperlipidemia Brother    Cancer Maternal Aunt        numerous breast cancers   Depression Brother        1/2 brother   Hypertension Other    Hypertension Paternal Grandmother    Hypertension Paternal Grandfather     Social History   Socioeconomic History   Marital status: Married    Spouse name: [name]   Number of children: 1   Years of education: BSN   Highest education level: Not on file  Occupational History   Occupation: Palliative Care Nurse     Employer: [employer]  Social Needs   Financial resource strain: Not on file   Food insecurity    Worry: Not on file    Inability: Not on file   Transportation needs    Medical: Not on file    Non-medical: Not on file  Tobacco  Use   Smoking status: Former Smoker    Quit date: 10/16/1991    Years since quitting: 27.5   Smokeless tobacco: Never Used   Tobacco comment: smoked 1981-1993, up to 1 ppd  Substance and Sexual Activity  Alcohol use: Yes    Comment: occasionally    Drug use: No   Sexual activity: Not on file    Comment: 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, no major dietary restrictions, lives with husband and son, minimize dairy  Lifestyle   Physical activity    Days per week: Not on file    Minutes per session: Not on file   Stress: Not on file  Relationships   Social connections    Talks on phone: Not on file    Gets together: Not on file    Attends religious service: Not on file    Active member of club or organization: Not on file    Attends meetings of clubs or organizations: Not on file    Relationship status: Not on file   Intimate partner violence    Fear of current or ex partner: Not on file    Emotionally abused: Not on file    Physically abused: Not on file    Forced sexual activity: Not on file  Other Topics Concern   Not on file  Social History Narrative   [name]).   Patient has one child.   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      Patient drinks two cups of coffee every morning.   Regular exercise- Tae Kwon Do         The PMH, PSH, Social History, Family History, Medications, and allergies have been reviewed in [facility], and have been updated if relevant.  Review of Systems  Constitutional: Negative for fatigue.  HENT: Negative.   Eyes: Negative.   Respiratory: Negative.   Cardiovascular: Negative.   Gastrointestinal: Negative.   Endocrine: Negative.   Genitourinary: Negative.   Musculoskeletal: Positive for arthralgias. Negative for myalgias.  Skin: Negative.   Allergic/Immunologic: Negative.   Neurological: Negative.  Negative for numbness.  Hematological: Negative.   Psychiatric/Behavioral: Negative.   All  other systems reviewed and are negative.      Objective:    BP 114/78 (BP Location: Left Arm, Patient Position: Sitting, Cuff Size: Normal)    Pulse 76    Temp 98 F (36.7 C) (Oral)    Ht 5\' 5"  (1.651 m)    Wt 144 lb 9.6 oz (65.6 kg)    LMP 10/11/2013    SpO2 98%    BMI 24.06 kg/m    Physical Exam Vitals signs and nursing note reviewed.  Constitutional:      General: She is not in acute distress.    Appearance: She is well-developed. She is not diaphoretic.  HENT:     Head: Normocephalic and atraumatic.  Neck:     Musculoskeletal: Normal range of motion.  Cardiovascular:     Rate and Rhythm: Normal rate and regular rhythm.  Pulmonary:     Effort: Pulmonary effort is normal.     Breath sounds: Normal breath sounds.  Abdominal:     General: Bowel sounds are normal.     Palpations: Abdomen is soft.  Musculoskeletal:     Right hand: She exhibits decreased range of motion. Decreased strength noted.     Left hand: She exhibits decreased range of motion and bony tenderness.  Skin:    General: Skin is warm and dry.  Neurological:     Mental Status: She is alert and oriented to person, place, and time.     Cranial Nerves: No cranial nerve deficit.  Psychiatric:  Behavior: Behavior normal.        Thought Content: Thought content normal.        Judgment: Judgment normal.           Assessment & Plan:   Well woman exam without gynecological exam  Vitamin D deficiency - Plan: Vitamin D (25 hydroxy)  Pure hypercholesterolemia - Plan: CBC with Differential/Platelet, Comprehensive metabolic panel, Lipid panel, TSH  Insomnia w/ sleep apnea  Connective tissue disease, undifferentiated (HCC)  Malignant neoplasm of left female breast, unspecified estrogen receptor status, unspecified site of breast ([place]), Chronic No follow-ups on file.

## 2019-04-19 ENCOUNTER — Other Ambulatory Visit: Payer: 59

## 2019-04-19 DIAGNOSIS — E7800 Pure hypercholesterolemia, unspecified: Secondary | ICD-10-CM

## 2019-04-19 DIAGNOSIS — E559 Vitamin D deficiency, unspecified: Secondary | ICD-10-CM

## 2019-04-19 LAB — COMPREHENSIVE METABOLIC PANEL
ALT: 16 U/L (ref 0–35)
AST: 23 U/L (ref 0–37)
Albumin: 5 g/dL (ref 3.5–5.2)
Alkaline Phosphatase: 76 U/L (ref 39–117)
BUN: 16 mg/dL (ref 6–23)
CO2: 27 mEq/L (ref 19–32)
Calcium: 10.2 mg/dL (ref 8.4–10.5)
Chloride: 100 mEq/L (ref 96–112)
Creatinine, Ser: 0.87 mg/dL (ref 0.40–1.20)
GFR: 67.56 mL/min (ref >60.00)
Glucose, Bld: 102 mg/dL — ABNORMAL HIGH (ref 70–99)
Potassium: 4.2 mEq/L (ref 3.5–5.1)
Sodium: 139 mEq/L (ref 135–145)
Total Bilirubin: 0.7 mg/dL (ref 0.2–1.2)
Total Protein: 7.9 g/dL (ref 6.0–8.3)

## 2019-04-19 LAB — CBC WITH DIFFERENTIAL/PLATELET
Basophils Absolute: 0 10*3/uL (ref 0.0–0.1)
Basophils Relative: 0.7 % (ref 0.0–3.0)
Eosinophils Absolute: 0.1 10*3/uL (ref 0.0–0.7)
Eosinophils Relative: 1 % (ref 0.0–5.0)
HCT: 42.6 % (ref 36.0–46.0)
Hemoglobin: 14.6 g/dL (ref 12.0–15.0)
Lymphocytes Relative: 15.6 % (ref 12.0–46.0)
Lymphs Abs: 1 10*3/uL (ref 0.7–4.0)
MCHC: 34.2 g/dL (ref 30.0–36.0)
MCV: 91 fl (ref 78.0–100.0)
Monocytes Absolute: 0.7 10*3/uL (ref 0.1–1.0)
Monocytes Relative: 10.3 % (ref 3.0–12.0)
Neutro Abs: 4.7 10*3/uL (ref 1.4–7.7)
Neutrophils Relative %: 72.4 % (ref 43.0–77.0)
Platelets: 234 10*3/uL (ref 150.0–400.0)
RBC: 4.68 Mil/uL (ref 3.87–5.11)
RDW: 12.6 % (ref 11.5–15.5)
WBC: 6.5 10*3/uL (ref 4.0–10.5)

## 2019-04-19 LAB — TSH: TSH: 1.03 u[IU]/mL (ref 0.35–4.50)

## 2019-04-19 LAB — LIPID PANEL
Cholesterol: 213 mg/dL — ABNORMAL HIGH (ref 0–200)
HDL: 99.6 mg/dL (ref >39.00)
LDL Cholesterol: 101 mg/dL — ABNORMAL HIGH (ref 0–99)
NonHDL: 113.48
Total CHOL/HDL Ratio: 2
Triglycerides: 60 mg/dL (ref 0.0–149.0)
VLDL: 12 mg/dL (ref 0.0–40.0)

## 2019-04-19 LAB — VITAMIN D 25 HYDROXY (VIT D DEFICIENCY, FRACTURES): VITD: 48.7 ng/mL (ref 30.00–100.00)

## 2019-04-21 NOTE — Progress Notes (Signed)
[name] [address] [place], [place] [address] Phone: [phone] Subjective:      CC: Bilateral hand pain  [id number]    I, Katherine [name], LAT, ATC, am serving as scribe for Dr. Hulan Saas.  04/22/19: Katherine [name] is a 53 y.o. female coming in with complaint of B hand pain R>L.  Pt states that her hands have been bothering her years w/ no specific MOI.  Pt states that she has been seeing Dr. Amil Amen and does have + antibodies for Sjoren's, RA and Lupus.  She reports pain, swelling and a sensation of her fingers getting "stuck" on both hands.  She rates her baseline pain as a 2/10 and when pain flares she will get some radiation into her B wrists and forearms.  Onset- years ago Location: B hands Character- acing Aggravating factors- more activity  Therapies tried-  Severity- 8/10     Past Medical History:  Diagnosis Date  . Atypical lobular hyperplasia of left breast   . Heart murmur    ECHO 2012-trivial regurg-all else normal  . History of chicken pox   . Insomnia   . 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 Insomnia, persistent 08/28/2013   Menopausal induced insomnia, early morning awakenings, and snoring.   . Low back pain 05/10/2017  . Paresthesias 05/09/2017  . Plantar fasciitis   . PONV (postoperative nausea and vomiting)   . Preventative health care 03/21/2016  . Pure hypercholesterolemia   . Raynaud's syndrome   . 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 BREAST EXCISIONAL BIOPSY Left   . BREAST SURGERY  11/20/13   left breast; Dr Donne Hazel  . CESAREAN SECTION  1999  . plantar fasciitis surgery  2011, 2007   both feet  . TONSILLECTOMY AND ADENOIDECTOMY  1981  . uterine fibroidectomy     @ c section   Social History   Socioeconomic History  . Marital status: Married    Spouse name: [name]  . Number of children: 1  . Years of  education: BSN  . Highest education level: Not on file  Occupational History  . Occupation: Palliative Care Nurse     Employer: [employer]  . Financial resource strain: Not on file  . Food insecurity    Worry: Not on file    Inability: Not on file  . Transportation needs    Medical: Not on file    Non-medical: Not on file  Tobacco Use  . Smoking status: Former Smoker    Quit date: 10/16/1991    Years since quitting: 27.5  . Smokeless tobacco: Never Used  . Tobacco comment: smoked 1981-1993, up to 1 ppd  Substance and Sexual Activity  . Alcohol use: Yes    Comment: occasionally   . Drug use: No  . 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 Physical activity    Days per week: Not on file    Minutes per session: Not on file  . Stress: Not on file  Relationships  . Social Herbalist on phone: Not on file    Gets together: Not on file    Attends religious service: Not on file    Active member of club  or organization: Not on file    Attends meetings of clubs or organizations: Not on file    Relationship status: Not on file  Other Topics Concern  . Not on file  Social History Narrative   [name]).   Patient has one child.   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      Patient drinks two cups of coffee every morning.   Regular exercise- Tae Kwon Do         Allergies  Allergen Reactions  . Ultram [Tramadol] Nausea And Vomiting  . Codeine Itching and Nausea And Vomiting  . Hydrocodone-Acetaminophen     REACTION: NAUSEA  . Oxycodone-Acetaminophen     REACTION: NAUSEA   Family History  Adopted: Yes  Problem Relation Age of Onset  . Heart attack Maternal Uncle 37  . Heart disease Maternal Uncle 37       MI  . Cervical cancer Maternal Grandmother   . Heart Problems Maternal  Grandmother        arrhythmia  . Cancer Maternal Grandmother        gyn  . Stroke Mother        in 18s  . Diabetes Mother   . Heart disease Mother   . Hyperlipidemia Mother   . Hyperlipidemia Brother   . Cancer Maternal Aunt        numerous breast cancers  . Depression Brother        1/2 brother  . Hypertension Other   . Hypertension Paternal Grandmother   . Hypertension Paternal Grandfather      Current Outpatient Medications (Cardiovascular):  .  amLODipine (NORVASC) 5 MG tablet, Take 1 tablet (5 mg total) by mouth daily.   Current Outpatient Medications (Analgesics):  .  ibuprofen (ADVIL,MOTRIN) 200 MG tablet, Take 400 mg by mouth every 6 (six) hours as needed.  Current Outpatient Medications (Hematological):  Marland Kitchen  Cyanocobalamin (VITAMIN B-12) 2500 MCG SUBL, Place under the tongue daily.  Current Outpatient Medications (Other):  .  acyclovir cream (ZOVIRAX) 5 %, Apply topically 5 x daily x 4 days prn lesion .  calcium carbonate (OSCAL) 1500 (600 Ca) MG TABS tablet, Take 600 mg of elemental calcium by mouth daily. Marland Kitchen  gabapentin (NEURONTIN) 100 MG capsule, 100 mg. Take 1-2qhs prn .  hydroxychloroquine (PLAQUENIL) 200 MG tablet, Take 1 tablet by mouth 2 (two) times daily. .  Misc Natural Products (NARCOSOFT HERBAL LAX PO), Take by mouth. .  Multiple Vitamins-Minerals (CENTRUM SILVER PO), Take by mouth daily. .  Probiotic Product (PROBIOTIC DAILY PO), Take by mouth. .  zaleplon (SONATA) 10 MG capsule, TAKE 1 CAPSULE BY MOUTH AT BEDTIME AS NEEDED FOR SLEEP .  omeprazole (PRILOSEC) 40 MG capsule, Take 1 capsule (40 mg total) by mouth 2 (two) times daily. Take in the morning and at dinnertime.    Past medical history, social, surgical and family history all reviewed in electronic medical record.  No pertanent information unless stated regarding to the chief complaint.   Review of Systems:  No headache, visual changes, nausea, vomiting, diarrhea, constipation, dizziness,  abdominal pain, skin rash, fevers, chills, night sweats, weight loss, swollen lymph nodes, body aches, joint swelling,  chest pain, shortness of breath, mood changes.  Positive muscle aches   Objective  Blood pressure 120/78, height 5\' 5"  (1.651 m), weight 147 lb 9.6 oz (67 kg), last menstrual period 10/11/2013.    General: No apparent distress alert and oriented x3 mood and affect  normal, dressed appropriately.  HEENT: Pupils equal, extraocular movements intact  Respiratory: Patient's speak in full sentences and does not appear short of breath  Cardiovascular: No lower extremity edema, tender, no erythema  Skin: Warm dry intact with no signs of infection or rash on extremities or on axial skeleton.  Abdomen: Soft nontender  Neuro: Cranial nerves II through XII are intact, neurovascularly intact in all extremities with 2+ DTRs and 2+ pulses.  Lymph: No lymphadenopathy of posterior or anterior cervical chain or axillae bilaterally.  Gait normal with good balance and coordination.  MSK:  tender with mild lower back pain range of motion stability and symmetric strength and tone of shoulders, elbows, wrist, hip, knee and ankles bilaterally.  Patient is  TTP seen in the left lower spine  Number nodes in the hands.  Slow capillary refill.      Impression and Recommendations:     This case required medical decision making of moderate complexity. The above documentation has been reviewed and is accurate and complete Lyndal Pulley, DO       Note: This dictation was prepared with Dragon dictation along with smaller phrase technology. Any transcriptional errors that result from this process are unintentional.

## 2019-04-22 ENCOUNTER — Ambulatory Visit (INDEPENDENT_AMBULATORY_CARE_PROVIDER_SITE_OTHER): Payer: 59 | Admitting: Family Medicine

## 2019-04-22 ENCOUNTER — Ambulatory Visit: Payer: Self-pay

## 2019-04-22 ENCOUNTER — Encounter: Payer: Self-pay | Admitting: Family Medicine

## 2019-04-22 ENCOUNTER — Other Ambulatory Visit: Payer: Self-pay

## 2019-04-22 ENCOUNTER — Encounter: Payer: Self-pay | Admitting: Physical Therapy

## 2019-04-22 VITALS — BP 120/78 | Ht 65.0 in | Wt 147.6 lb

## 2019-04-22 DIAGNOSIS — R55 Syncope and collapse: Secondary | ICD-10-CM | POA: Diagnosis not present

## 2019-04-22 DIAGNOSIS — M79641 Pain in right hand: Secondary | ICD-10-CM

## 2019-04-22 DIAGNOSIS — M359 Systemic involvement of connective tissue, unspecified: Secondary | ICD-10-CM | POA: Diagnosis not present

## 2019-04-22 DIAGNOSIS — I7300 Raynaud's syndrome without gangrene: Secondary | ICD-10-CM | POA: Diagnosis not present

## 2019-04-22 DIAGNOSIS — M79642 Pain in left hand: Secondary | ICD-10-CM | POA: Diagnosis not present

## 2019-04-22 MED ORDER — AMLODIPINE BESYLATE 5 MG PO TABS: 5.0000 mg | ORAL_TABLET | Freq: Every day | ORAL | 3 refills | Status: DC

## 2019-04-22 MED ORDER — OMEPRAZOLE 40 MG PO CPDR: 40.0000 mg | DELAYED_RELEASE_CAPSULE | Freq: Two times a day (BID) | ORAL | 11 refills | Status: DC

## 2019-04-22 MED FILL — OMEPRAZOLE 40 MG CPDR: 40 | 30 days supply | Qty: 60 | Fill #0

## 2019-04-22 MED FILL — AMLODIPINE BESYLATE 5 MG TA: 5 | 90 days supply | Qty: 90 | Fill #0

## 2019-04-22 NOTE — Patient Instructions (Signed)
Amlodipine 5mg  daily; If too much swelling, decrease to 2.5 mg  Prilosec bid x 2 weeks  MK-7 Look at  [employer]  Let's hold on methotrexate  Glove liners at night  Stop by and let me know how you're doing in 2 weeks

## 2019-04-22 NOTE — Assessment & Plan Note (Signed)
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  We discussed significant amount of tissue disorder.  Discussed which activities doing which wants to avoid.  Concern for the possibility of crest syndrome.  Discussed vitamin D supplementation, patient has what appears to be for nonuse.  Given amlodipine at a low dose.  Discussed different other changes she can make such as sleeping in a glove liners agree beneficial before we consider the possibility of addition of the methotrexate.  Patient will send a message in the next 2 weeks otherwise

## 2019-04-22 NOTE — Assessment & Plan Note (Signed)
Started on amlodipine

## 2019-05-15 MED FILL — ZALEPLON 10 MG CAPS: 10 | 30 days supply | Qty: 30 | Fill #4

## 2019-05-15 MED FILL — HYDROXYCHLOROQUINE 200 MG T: 200 | 30 days supply | Qty: 60 | Fill #3

## 2019-06-21 MED FILL — HYDROXYCHLOROQUINE 200 MG T: 200 | 30 days supply | Qty: 60 | Fill #0

## 2019-07-26 ENCOUNTER — Other Ambulatory Visit: Payer: Self-pay | Admitting: Family Medicine

## 2019-07-26 ENCOUNTER — Encounter: Payer: Self-pay | Admitting: Family Medicine

## 2019-07-26 DIAGNOSIS — G4700 Insomnia, unspecified: Secondary | ICD-10-CM

## 2019-07-26 MED ORDER — ZALEPLON 10 MG PO CAPS: ORAL_CAPSULE | ORAL | 5 refills | Status: DC

## 2019-07-26 MED FILL — ZALEPLON 10 MG CAPS: 10 | 30 days supply | Qty: 30 | Fill #0

## 2019-07-26 NOTE — Telephone Encounter (Signed)
Last OV 04/16/19 Last fill 12/13/18  #30/5

## 2019-09-03 ENCOUNTER — Other Ambulatory Visit: Payer: Self-pay | Admitting: Obstetrics and Gynecology

## 2019-09-03 ENCOUNTER — Telehealth: Payer: Self-pay | Admitting: General Practice

## 2019-09-03 DIAGNOSIS — Z1231 Encounter for screening mammogram for malignant neoplasm of breast: Secondary | ICD-10-CM

## 2019-09-03 NOTE — Telephone Encounter (Signed)
Patient is calling and requesting a TOC to Dr. Bryan Lemma. Pt was a patient of Dr. Deborra Medina. [phone]

## 2019-09-10 NOTE — Telephone Encounter (Signed)
MC-Plz see pt req to schedule TOC visit with you/plz advise/thx dmf

## 2019-09-11 DIAGNOSIS — G629 Polyneuropathy, unspecified: Secondary | ICD-10-CM | POA: Diagnosis not present

## 2019-09-11 DIAGNOSIS — M2550 Pain in unspecified joint: Secondary | ICD-10-CM | POA: Diagnosis not present

## 2019-09-11 DIAGNOSIS — M359 Systemic involvement of connective tissue, unspecified: Secondary | ICD-10-CM | POA: Diagnosis not present

## 2019-09-11 DIAGNOSIS — I7300 Raynaud's syndrome without gangrene: Secondary | ICD-10-CM | POA: Diagnosis not present

## 2019-09-11 NOTE — Telephone Encounter (Signed)
Ok with me 

## 2019-09-11 NOTE — Telephone Encounter (Signed)
Plz call pt to sched TOC visit with Dr. C/thx dmf 

## 2019-09-13 NOTE — Telephone Encounter (Signed)
Lvm for pt to call back and schedule.  

## 2019-10-04 ENCOUNTER — Other Ambulatory Visit: Payer: Self-pay

## 2019-10-04 ENCOUNTER — Ambulatory Visit
Admission: RE | Admit: 2019-10-04 | Discharge: 2019-10-04 | Disposition: A | Discharge status: Home / Self Care | Payer: 59 | Source: Ambulatory Visit | Attending: Obstetrics and Gynecology | Admitting: Obstetrics and Gynecology

## 2019-10-04 DIAGNOSIS — Z1231 Encounter for screening mammogram for malignant neoplasm of breast: Secondary | ICD-10-CM | POA: Diagnosis not present

## 2020-01-24 IMAGING — MG DIGITAL SCREENING BILATERAL MAMMOGRAM WITH TOMO AND CAD
8 series · 9 of 24 positions shown · non-contrast
Comparison: Previous exam(s).

CLINICAL DATA: Screening.

EXAM:
DIGITAL SCREENING BILATERAL MAMMOGRAM WITH TOMO AND CAD

[L CC synth-2D]
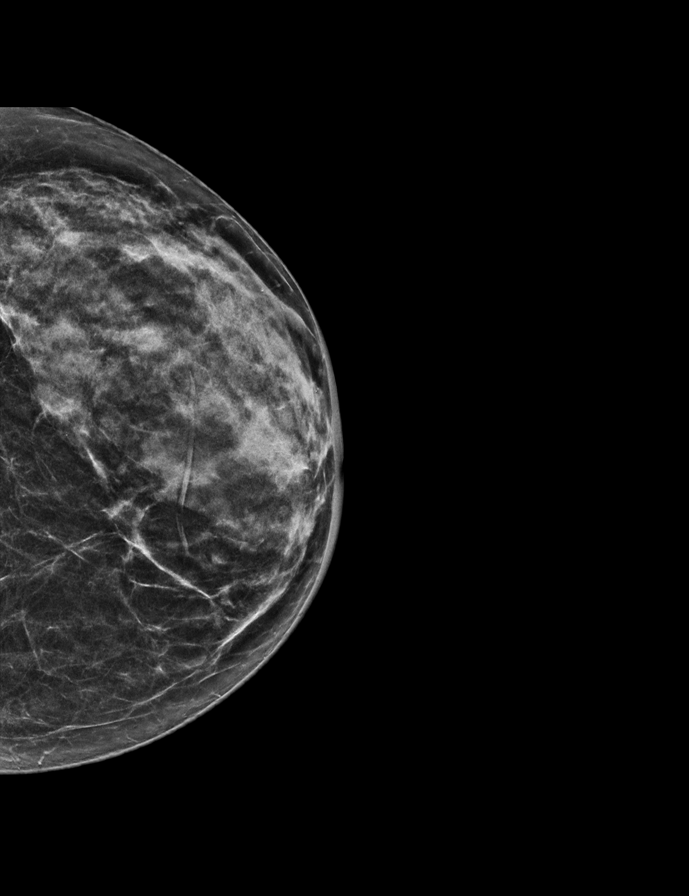

[R MLO synth-2D]
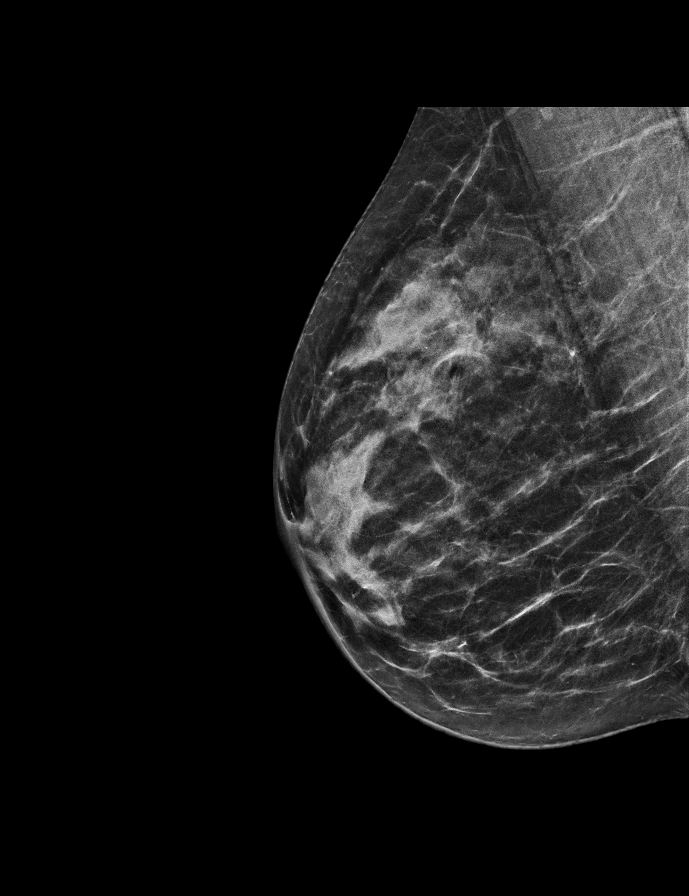

[R CC synth-2D]
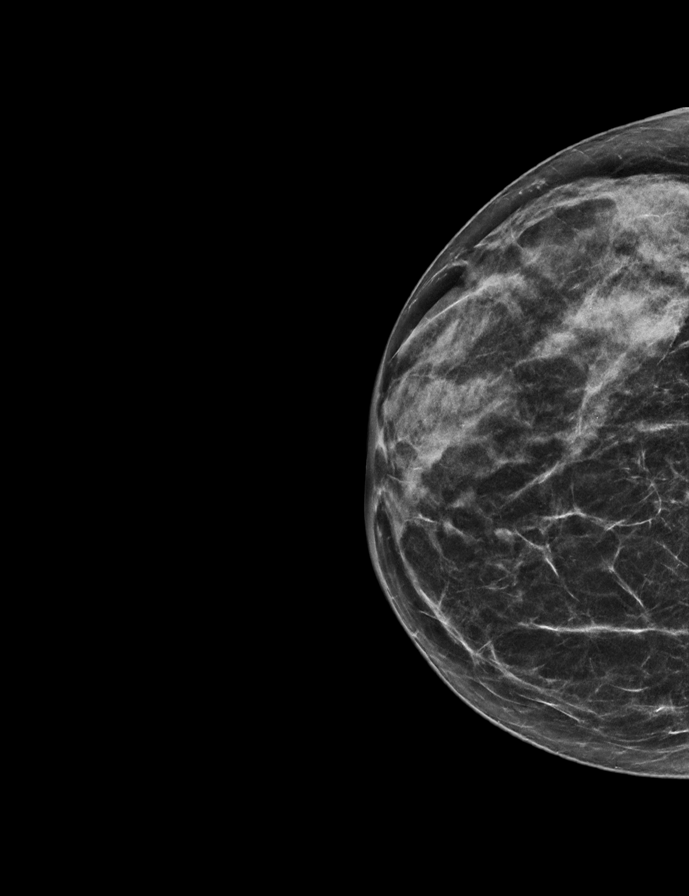

[L MLO synth-2D]
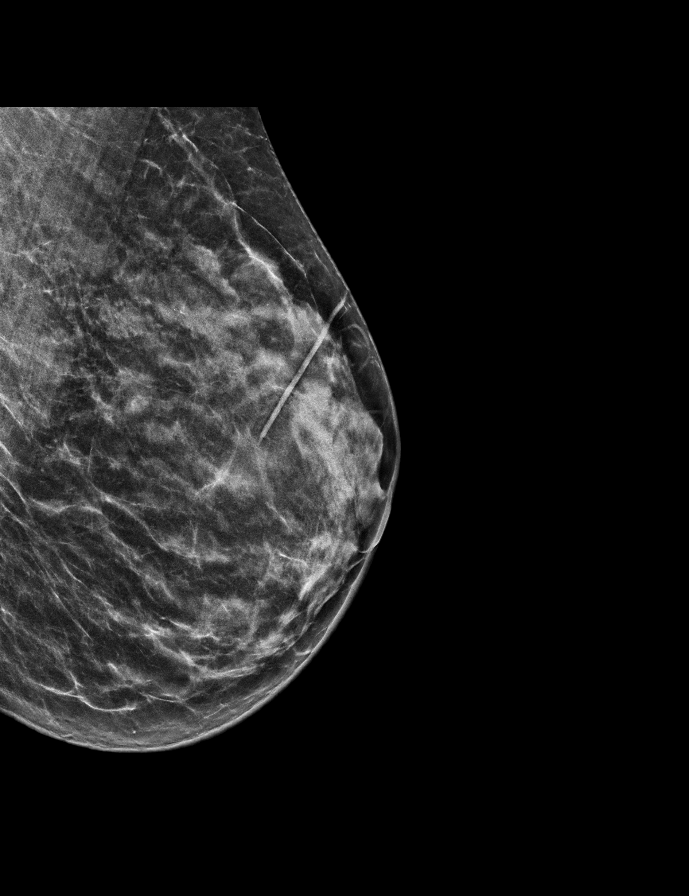

[R CC tomo · 2 of 53 frames shown]
[frame 18/53]
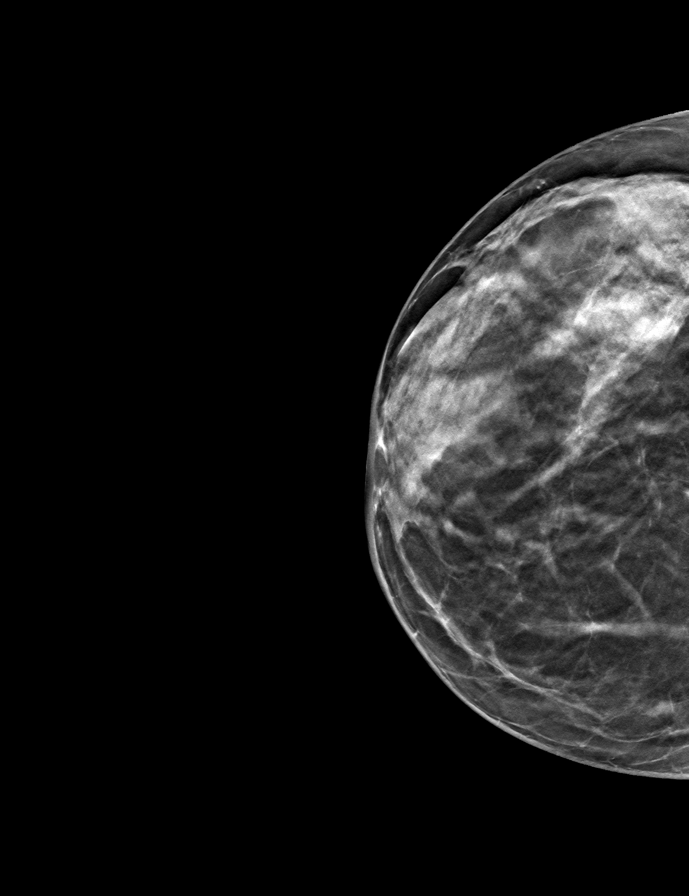
[frame 27/53]
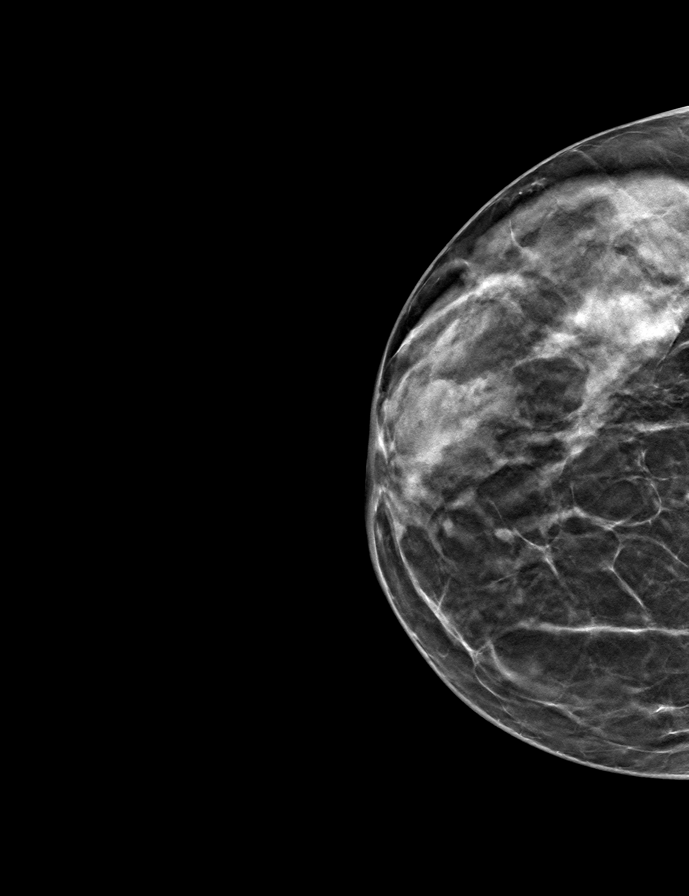

[L MLO tomo · tomo slice 27/54.0]
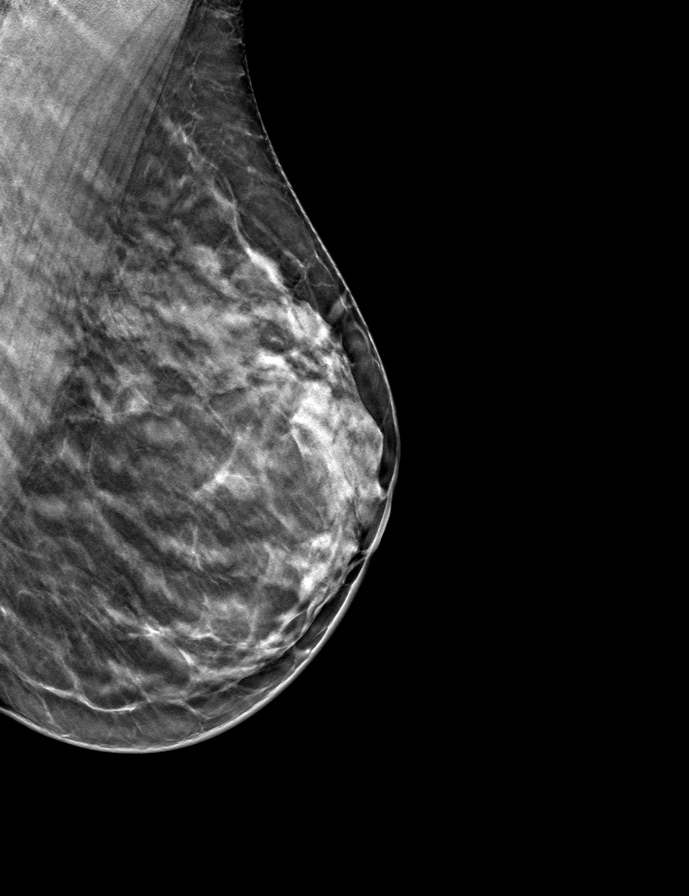

[L CC tomo · tomo slice 27/53.0]
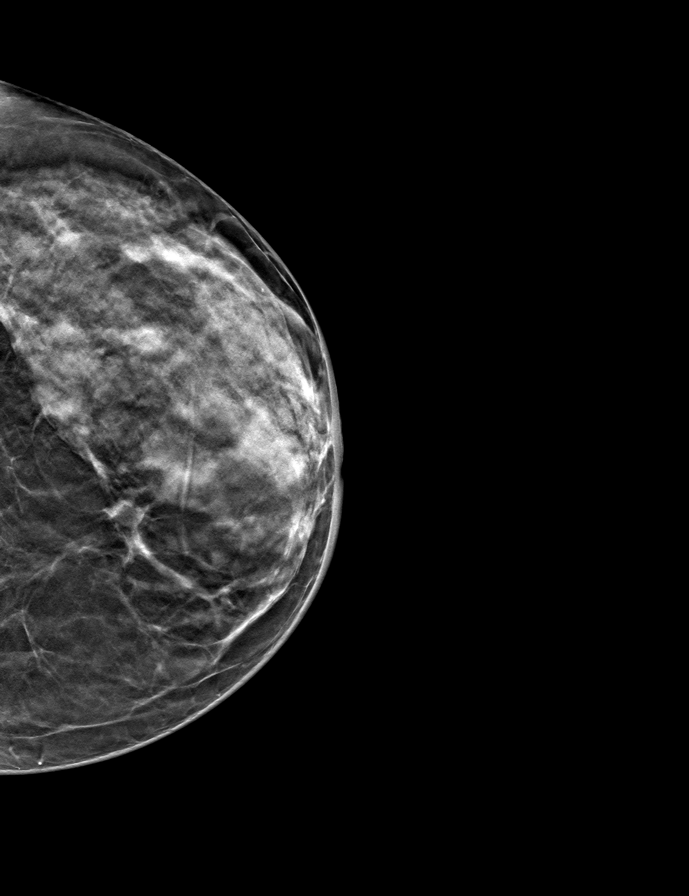

[R MLO tomo · tomo slice 28/55.0]
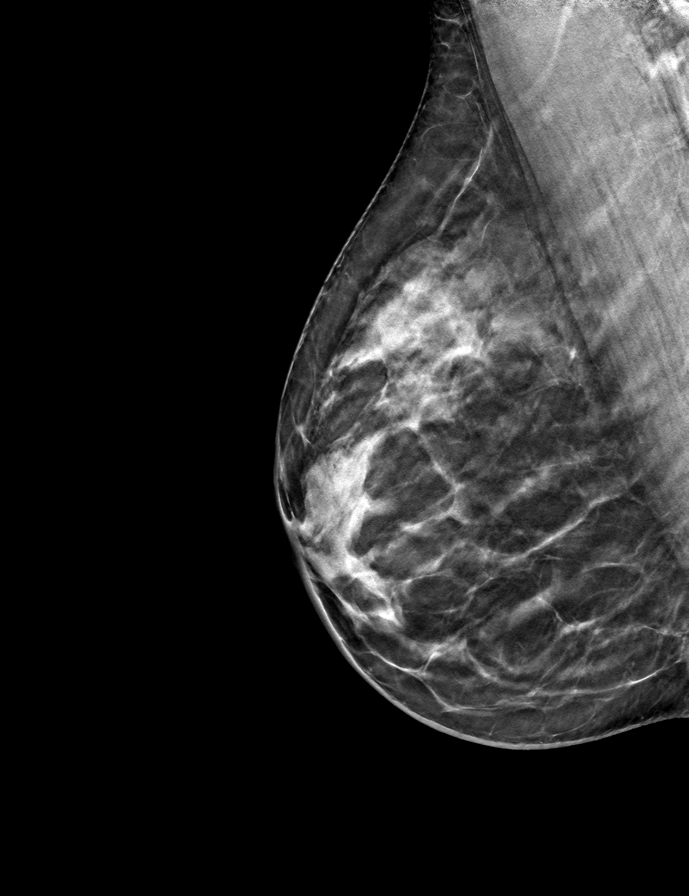

[9 of 24 positions shown; findings below may reference images not displayed]

ACR Breast Density Category c: The breast tissue is heterogeneously
dense, which may obscure small masses.
FINDINGS: There are no findings suspicious for malignancy. Images were
processed with CAD.
IMPRESSION: No mammographic evidence of malignancy. A result letter of this
screening mammogram will be mailed directly to the patient.

RECOMMENDATION:
Screening mammogram in one year. (Code:FT-U-LHB)

BI-RADS CATEGORY  1: Negative.

## 2020-02-12 ENCOUNTER — Other Ambulatory Visit: Payer: Self-pay | Admitting: Family Medicine

## 2020-02-12 DIAGNOSIS — G4700 Insomnia, unspecified: Secondary | ICD-10-CM

## 2020-02-12 NOTE — Telephone Encounter (Signed)
Last OV 04/16/19 Last fill 07/26/19  #30/5 Next Ov 04/17/20

## 2020-02-28 DIAGNOSIS — Z20822 Contact with and (suspected) exposure to covid-19: Secondary | ICD-10-CM | POA: Diagnosis not present

## 2020-04-10 ENCOUNTER — Other Ambulatory Visit: Payer: Self-pay

## 2020-04-10 ENCOUNTER — Ambulatory Visit: Payer: 59 | Admitting: Medical

## 2020-04-10 VITALS — BP 136/82 | HR 67 | Temp 97.5°F | Resp 20 | Ht 65.0 in | Wt 147.2 lb

## 2020-04-10 DIAGNOSIS — R300 Dysuria: Secondary | ICD-10-CM | POA: Diagnosis not present

## 2020-04-10 DIAGNOSIS — R109 Unspecified abdominal pain: Secondary | ICD-10-CM

## 2020-04-10 LAB — COMPREHENSIVE METABOLIC PANEL
AG Ratio: 1.6 (calc) (ref 1.0–2.5)
ALT: 14 U/L (ref 6–29)
AST: 20 U/L (ref 10–35)
Albumin: 4.7 g/dL (ref 3.6–5.1)
Alkaline phosphatase (APISO): 92 U/L (ref 37–153)
BUN: 21 mg/dL (ref 7–25)
CO2: 31 mmol/L (ref 20–32)
Calcium: 10 mg/dL (ref 8.6–10.4)
Chloride: 101 mmol/L (ref 98–110)
Creat: 0.85 mg/dL (ref 0.50–1.05)
Globulin: 3 g/dL (calc) (ref 1.9–3.7)
Glucose, Bld: 112 mg/dL — ABNORMAL HIGH (ref 65–99)
Potassium: 4 mmol/L (ref 3.5–5.3)
Sodium: 139 mmol/L (ref 135–146)
Total Bilirubin: 0.5 mg/dL (ref 0.2–1.2)
Total Protein: 7.7 g/dL (ref 6.1–8.1)

## 2020-04-10 LAB — POC URINALSYSI DIPSTICK (AUTOMATED)
Bilirubin, UA: NEGATIVE
Blood, UA: NEGATIVE
Glucose, UA: NEGATIVE
Leukocytes, UA: NEGATIVE
Nitrite, UA: NEGATIVE
Protein, UA: NEGATIVE
Spec Grav, UA: 1.01 (ref 1.010–1.025)
Urobilinogen, UA: 0.2 E.U./dL
pH, UA: 6 (ref 5.0–8.0)

## 2020-04-10 LAB — CBC WITH DIFFERENTIAL/PLATELET
Absolute Monocytes: 717 cells/uL (ref 200–950)
Basophils Absolute: 40 cells/uL (ref 0–200)
Basophils Relative: 0.6 %
Eosinophils Absolute: 67 cells/uL (ref 15–500)
Eosinophils Relative: 1 %
HCT: 41.1 % (ref 35.0–45.0)
Hemoglobin: 13.7 g/dL (ref 11.7–15.5)
Lymphs Abs: 1240 cells/uL (ref 850–3900)
MCH: 31.1 pg (ref 27.0–33.0)
MCHC: 33.3 g/dL (ref 32.0–36.0)
MCV: 93.2 fL (ref 80.0–100.0)
MPV: 10.4 fL (ref 7.5–12.5)
Monocytes Relative: 10.7 %
Neutro Abs: 4636 cells/uL (ref 1500–7800)
Neutrophils Relative %: 69.2 %
Platelets: 229 10*3/uL (ref 140–400)
RBC: 4.41 10*6/uL (ref 3.80–5.10)
RDW: 12 % (ref 11.0–15.0)
Total Lymphocyte: 18.5 %
WBC: 6.7 10*3/uL (ref 3.8–10.8)

## 2020-04-10 LAB — LIPASE: Lipase: 46 U/L (ref 7–60)

## 2020-04-10 MED ORDER — CIPROFLOXACIN HCL 500 MG PO TABS: 500.0000 mg | ORAL_TABLET | Freq: Two times a day (BID) | ORAL | 0 refills | Status: DC

## 2020-04-10 MED ORDER — METRONIDAZOLE 500 MG PO TABS: 500.0000 mg | ORAL_TABLET | Freq: Three times a day (TID) | ORAL | 0 refills | Status: DC

## 2020-04-10 NOTE — Progress Notes (Signed)
Subjective:    Patient ID: Katherine [name], female    DOB: 07/04/1966, 54 y.o.   MRN: [id number]  HPI  Pt in for recent urinary symptoms. But also mentions also left of and above umbilicus. Pt states Thursday and today no bm. Usually has bm every day. Pt states abd discomfort was first symptom felt followed by below.  Pt in today reporting urinary symptoms.  Dysuria- yes. Minimal. Frequent urination-yes Hesitancy-yes Suprapubic pressure-mild Fever-no chills-yes Nausea-no Vomiting-no CVA pain-no History of UTI-2 a year. Gross hematuria-no  Some fatigue.  Pt has some azostandard at home.   Review of Systems  Constitutional: Positive for fatigue. Negative for chills and fever.  Respiratory: Negative for cough, shortness of breath and wheezing.   Cardiovascular: Negative for chest pain and palpitations.  Gastrointestinal: Positive for abdominal pain.  Genitourinary: Positive for dysuria, frequency and urgency. Negative for vaginal pain.  Musculoskeletal: Negative for back pain and joint swelling.  Skin: Negative for rash.  Hematological: Negative for adenopathy. Does not bruise/bleed easily.    Past Medical History:  Diagnosis Date  . Atypical lobular hyperplasia of left breast   . Heart murmur    ECHO 2012-trivial regurg-all else normal  . History of chicken pox   . Insomnia   . 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 Insomnia, persistent 08/28/2013   Menopausal induced insomnia, early morning awakenings, and snoring.   . Low back pain 05/10/2017  . Paresthesias 05/09/2017  . Plantar fasciitis   . PONV (postoperative nausea and vomiting)   . Preventative health care 03/21/2016  . Pure hypercholesterolemia   . Raynaud's syndrome   . 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 Marital status: Married    Spouse name: [name]  . Number of children: 1  . Years of education: BSN  . Highest  education level: Not on file  Occupational History  . Occupation: Palliative Care Nurse     Employer: [employer]  Tobacco Use  . Smoking status: Former Smoker    Quit date: 10/16/1991    Years since quitting: 28.5  . Smokeless tobacco: Never Used  . Tobacco comment: smoked 1981-1993, up to 1 ppd  Substance and Sexual Activity  . Alcohol use: Yes    Comment: occasionally   . Drug use: No  . 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 Not on file  Social History Narrative   [name]).   Patient has one child.   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      Patient drinks two cups of coffee every morning.   Regular exercise- Tae Kwon Do         Social Determinants of Health   Financial Resource Strain:   . Difficulty of Paying Living Expenses: Not on file  Food Insecurity:   . Worried About Charity fundraiser in the Last Year: Not on file  . Ran Out of Food in the Last Year: Not on file  Transportation Needs:   . Lack of Transportation (Medical): Not on file  . Lack of Transportation (Non-Medical): Not on file  Physical Activity:   . Days of Exercise per Week: Not on file  . Minutes of Exercise per Session: Not  on file  Stress:   . Feeling of Stress : Not on file  Social Connections:   . Frequency of Communication with Friends and Family: Not on file  . Frequency of Social Gatherings with Friends and Family: Not on file  . Attends Religious Services: Not on file  . Active Member of Clubs or Organizations: Not on file  . Attends Archivist Meetings: Not on file  . Marital Status: Not on file  Intimate Partner Violence:   . Fear of Current or Ex-Partner: Not on file  . Emotionally Abused: Not on file  . Physically Abused: Not on file  . Sexually Abused: Not on file    Past Surgical  History:  Procedure Laterality Date  . BREAST EXCISIONAL BIOPSY Left   . BREAST SURGERY  11/20/13   left breast; Dr Donne Hazel  . CESAREAN SECTION  1999  . plantar fasciitis surgery  2011, 2007   both feet  . TONSILLECTOMY AND ADENOIDECTOMY  1981  . uterine fibroidectomy     @ c section    Family History  Adopted: Yes  Problem Relation Age of Onset  . Heart attack Maternal Uncle 37  . Heart disease Maternal Uncle 37       MI  . Cervical cancer Maternal Grandmother   . Heart Problems Maternal Grandmother        arrhythmia  . Cancer Maternal Grandmother        gyn  . Stroke Mother        in 97s  . Diabetes Mother   . Heart disease Mother   . Hyperlipidemia Mother   . Hyperlipidemia Brother   . Cancer Maternal Aunt        numerous breast cancers  . Depression Brother        1/2 brother  . Hypertension Other   . Hypertension Paternal Grandmother   . Hypertension Paternal Grandfather     Allergies  Allergen Reactions  . Ultram [Tramadol] Nausea And Vomiting  . Codeine Itching and Nausea And Vomiting  . Hydrocodone-Acetaminophen     REACTION: NAUSEA  . Oxycodone-Acetaminophen     REACTION: NAUSEA    Current Outpatient Medications on File Prior to Visit  Medication Sig Dispense Refill  . acyclovir cream (ZOVIRAX) 5 % Apply topically 5 x daily x 4 days prn lesion (Patient not taking: Reported on 04/10/2020) 15 g 1  . amLODipine (NORVASC) 5 MG tablet Take 1 tablet (5 mg total) by mouth daily. (Patient not taking: Reported on 04/10/2020) 90 tablet 3  . calcium carbonate (OSCAL) 1500 (600 Ca) MG TABS tablet Take 600 mg of elemental calcium by mouth daily.    . Cyanocobalamin (VITAMIN B-12) 2500 MCG SUBL Place under the tongue daily.    Marland Kitchen gabapentin (NEURONTIN) 100 MG capsule 100 mg. Take 1-2qhs prn (Patient not taking: Reported on 04/10/2020)    . hydroxychloroquine (PLAQUENIL) 200 MG tablet Take 1 tablet by mouth 2 (two) times daily. (Patient not taking: Reported on 04/10/2020)   3  . ibuprofen (ADVIL,MOTRIN) 200 MG tablet Take 400 mg by mouth every 6 (six) hours as needed. (Patient not taking: Reported on 04/10/2020)    . Misc Natural Products (NARCOSOFT HERBAL LAX PO) Take by mouth.    . Multiple Vitamins-Minerals (CENTRUM SILVER PO) Take by mouth daily.    Marland Kitchen omeprazole (PRILOSEC) 40 MG capsule Take 1 capsule (40 mg total) by mouth 2 (two) times daily. Take in the morning and at dinnertime. (Patient not taking:  Reported on 04/10/2020) 60 capsule 11  . Probiotic Product (PROBIOTIC DAILY PO) Take by mouth.    . zaleplon (SONATA) 10 MG capsule TAKE 1 CAPSULE BY MOUTH AT BEDTIME AS NEEDED FOR SLEEP 30 capsule 2   No current facility-administered medications on file prior to visit.    BP 136/82   Pulse 67   Temp (!) 97.5 F (36.4 C)   Resp 20   Ht 5\' 5"  (1.651 m)   Wt 147 lb 3.2 oz (66.8 kg)   LMP 10/11/2013   SpO2 99%   BMI 24.50 kg/m       Objective:   Physical Exam  General Appearance- Not in acute distress.  HEENT Eyes- Scleraeral/Conjuntiva-bilat- Not Yellow. Mouth & Throat- Normal.  Chest and Lung Exam Auscultation: Breath sounds:-Normal. Adventitious sounds:- No Adventitious sounds.  Cardiovascular Auscultation:Rythm - Regular. Heart Sounds -Normal heart sounds.  Abdomen Inspection:-Inspection Normal.  Palpation/Perucssion: Palpation and Percussion of the abdomen reveal- Non Tender, No Rebound tenderness, No rigidity(Guarding) and No Palpable abdominal masses.  Liver:-Normal.  Spleen:- Normal.   Back- no cva tenderness.      Assessment & Plan:  You have recent combination of symptoms that represent mostly urinary tract infection but at initial onset he did report abdomen pain and on physical exam pain is on left side.   Top 2 ifferential diagnosis presently UTI versus diverticulitis.  We will prescribe Cipro and Flagyl antibiotic.  Get CBC, CMP, amylase and urine culture.  Hydrate well.  Can use Azo-Standard over the weekend if  dysuria worsens.  We will notify you of lab results when you come in.  If signs symptoms worsen or change over the weekend then recommend ED evaluation.  After lab review and update on how you are doing might need to consider CT abdomen pelvis.  Follow-up in 7 days or as needed.  Mackie Pai, PA-C

## 2020-04-10 NOTE — Patient Instructions (Signed)
You have recent combination of symptoms that represent mostly urinary tract infection but at initial onset he did report abdomen pain and on physical exam pain is on left side.   Top 2 ifferential diagnosis presently UTI versus diverticulitis.  We will prescribe Cipro and Flagyl antibiotic.  Get CBC, CMP, amylase and urine culture.  Hydrate well.  Can use Azo-Standard over the weekend if dysuria worsens.  We will notify you of lab results when you come in.  If signs symptoms worsen or change over the weekend then recommend ED evaluation.  After lab review and update on how you are doing might need to consider CT abdomen pelvis.  Follow-up in 7 days or as needed.

## 2020-04-11 LAB — URINE CULTURE
MICRO NUMBER:: 10992936
Result:: NO GROWTH
SPECIMEN QUALITY:: ADEQUATE

## 2020-04-15 ENCOUNTER — Telehealth: Payer: Self-pay

## 2020-04-15 NOTE — Telephone Encounter (Signed)
Pt.notified

## 2020-04-15 NOTE — Telephone Encounter (Signed)
Patient wants to know if she should continue taking the antibiotics since her urine culture came back negative for no growth.

## 2020-04-15 NOTE — Telephone Encounter (Signed)
I had explained to pt top differential diagnosis for her symptoms uti vs diverticulitis. I prescribed cipro and flagyl antibiotic. Urine culture was negative but still treating potential diverticulitis. I think today is  day 5 of antibiotics. Would finish remaining 2 days. But if having side effects from antibitoics and symptoms resolved could stop antibiotics early.

## 2020-04-15 NOTE — Telephone Encounter (Signed)
-----   Message from Mackie Pai, PA-C sent at 04/11/2020  8:22 AM EDT ----- Your infection fighting cells are not elevated. No anemia. Good kidney function and normal liver enzymes. Sugar is mild elevated. Recommend low sugar diet and pcp can get a1c on next appointment.

## 2020-04-17 ENCOUNTER — Encounter: Payer: 59 | Admitting: Family Medicine

## 2020-05-07 ENCOUNTER — Other Ambulatory Visit: Payer: Self-pay

## 2020-05-08 ENCOUNTER — Ambulatory Visit (INDEPENDENT_AMBULATORY_CARE_PROVIDER_SITE_OTHER): Payer: 59 | Admitting: Family Medicine

## 2020-05-08 ENCOUNTER — Encounter: Payer: Self-pay | Admitting: Family Medicine

## 2020-05-08 VITALS — BP 116/70 | HR 84 | Temp 97.9°F | Ht 65.25 in | Wt 144.0 lb

## 2020-05-08 DIAGNOSIS — G4700 Insomnia, unspecified: Secondary | ICD-10-CM | POA: Diagnosis not present

## 2020-05-08 DIAGNOSIS — R109 Unspecified abdominal pain: Secondary | ICD-10-CM | POA: Diagnosis not present

## 2020-05-08 DIAGNOSIS — E7800 Pure hypercholesterolemia, unspecified: Secondary | ICD-10-CM | POA: Diagnosis not present

## 2020-05-08 DIAGNOSIS — Z833 Family history of diabetes mellitus: Secondary | ICD-10-CM

## 2020-05-08 DIAGNOSIS — Z23 Encounter for immunization: Secondary | ICD-10-CM | POA: Diagnosis not present

## 2020-05-08 DIAGNOSIS — E559 Vitamin D deficiency, unspecified: Secondary | ICD-10-CM

## 2020-05-08 DIAGNOSIS — Z0000 Encounter for general adult medical examination without abnormal findings: Secondary | ICD-10-CM | POA: Diagnosis not present

## 2020-05-08 MED ORDER — ZALEPLON 10 MG PO CAPS: ORAL_CAPSULE | ORAL | 2 refills | Status: DC

## 2020-05-08 NOTE — Progress Notes (Signed)
Katherine [name] is a 54 y.o. female  Chief Complaint  Patient presents with  . Establish Care    TOC- CPE/labs.  not fasting today.  c/o having dull ache in RUQ x 1 month.  last pap 14 months ago/has OB-GYN.     HPI: Katherine [name] is a 54 y.o. female who is a former patient of Dr. Deborra Medina who is seen today for annual CPE, labs.   Last PAP: 79mo ago - follows with GYN Dr. Ouida Sills Last mammo: 09/2019 Last colonoscopy: 12/2014 - due in 2026 (Dr. Cristina Gong with [facility])  Diet/Exercise: overall well balanced, regular exercise - walks daily w/ dog, goes to gym Dental: UTD Vision: pt has lasik eye surgery  Med refills needed today? sonata  Pt complains of dull ache in Lt mid-abdomen. No n/v. No heartburn. Pt is moving bowels normally and regularly. Doesn't change with movement.    Past Medical History:  Diagnosis Date  . Atypical lobular hyperplasia of left breast   . Heart murmur    ECHO 2012-trivial regurg-all else normal  . History of chicken pox   . Insomnia   . 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 Insomnia, persistent 08/28/2013   Menopausal induced insomnia, early morning awakenings, and snoring.   . Low back pain 05/10/2017  . Paresthesias 05/09/2017  . Plantar fasciitis   . PONV (postoperative nausea and vomiting)   . Preventative health care 03/21/2016  . Pure hypercholesterolemia   . Raynaud's syndrome   . 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 BREAST EXCISIONAL BIOPSY Left   . BREAST SURGERY  11/20/13   left breast; Dr Donne Hazel  . CESAREAN SECTION  1999  . plantar fasciitis surgery  2011, 2007   both feet  . TONSILLECTOMY AND ADENOIDECTOMY  1981  . uterine fibroidectomy     @ c section    Social History   Socioeconomic History  . Marital status: Married    Spouse name: [name]  . Number of children: 1  . Years of education: BSN  . Highest education level: Not on file    Occupational History  . Occupation: Palliative Care Nurse     Employer: [employer]  Tobacco Use  . Smoking status: Former Smoker    Quit date: 10/16/1991    Years since quitting: 28.5  . Smokeless tobacco: Never Used  . Tobacco comment: smoked 1981-1993, up to 1 ppd  Vaping Use  . Vaping Use: Never used  Substance and Sexual Activity  . Alcohol use: Yes    Comment: occasionally   . Drug use: No  . 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 Not on file  Social History Narrative   [name]).   Patient has one child.   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      Patient drinks two cups of coffee every morning.   Regular exercise- Tae Kwon Do         Social Determinants of Health   Financial Resource Strain:   . Difficulty of Paying Living Expenses: Not on file  Food Insecurity:   . Worried About Charity fundraiser in the Last Year: Not on file  . Ran Out of Food  in the Last Year: Not on file  Transportation Needs:   . Lack of Transportation (Medical): Not on file  . Lack of Transportation (Non-Medical): Not on file  Physical Activity:   . Days of Exercise per Week: Not on file  . Minutes of Exercise per Session: Not on file  Stress:   . Feeling of Stress : Not on file  Social Connections:   . Frequency of Communication with Friends and Family: Not on file  . Frequency of Social Gatherings with Friends and Family: Not on file  . Attends Religious Services: Not on file  . Active Member of Clubs or Organizations: Not on file  . Attends Archivist Meetings: Not on file  . Marital Status: Not on file  Intimate Partner Violence:   . Fear of Current or Ex-Partner: Not on file  . Emotionally Abused: Not on file  . Physically Abused: Not on file  . Sexually Abused: Not on file    Family  History  Adopted: Yes  Problem Relation Age of Onset  . Heart attack Maternal Uncle 37  . Heart disease Maternal Uncle 37       MI  . Cervical cancer Maternal Grandmother   . Heart Problems Maternal Grandmother        arrhythmia  . Cancer Maternal Grandmother        gyn  . Stroke Mother        in 29s  . Diabetes Mother   . Heart disease Mother   . Hyperlipidemia Mother   . Hyperlipidemia Brother   . Cancer Maternal Aunt        numerous breast cancers  . Depression Brother        1/2 brother  . Hypertension Other   . Hypertension Paternal Grandmother   . Hypertension Paternal Grandfather      Immunization History  Administered Date(s) Administered  . Influenza Nasal 04/09/2014  . Influenza Split 04/16/2015  . Influenza Whole 03/18/2012  . Influenza-Unspecified 03/18/2017, 04/28/2020  . PFIZER SARS-COV-2 Vaccination 09/05/2019, 10/02/2019  . Tdap 03/08/2016    Outpatient Encounter Medications as of 05/08/2020  Medication Sig  . Cyanocobalamin (VITAMIN B-12) 2500 MCG SUBL Place under the tongue daily.  . Misc Natural Products (NARCOSOFT HERBAL LAX PO) Take by mouth.  . Multiple Vitamins-Minerals (CENTRUM SILVER PO) Take by mouth daily.  . Probiotic Product (PROBIOTIC DAILY PO) Take by mouth.  . zaleplon (SONATA) 10 MG capsule TAKE 1 CAPSULE BY MOUTH AT BEDTIME AS NEEDED FOR SLEEP  . [DISCONTINUED] zaleplon (SONATA) 10 MG capsule TAKE 1 CAPSULE BY MOUTH AT BEDTIME AS NEEDED FOR SLEEP  . [DISCONTINUED] acyclovir cream (ZOVIRAX) 5 % Apply topically 5 x daily x 4 days prn lesion (Patient not taking: Reported on 04/10/2020)  . [DISCONTINUED] amLODipine (NORVASC) 5 MG tablet Take 1 tablet (5 mg total) by mouth daily. (Patient not taking: Reported on 04/10/2020)  . [DISCONTINUED] calcium carbonate (OSCAL) 1500 (600 Ca) MG TABS tablet Take 600 mg of elemental calcium by mouth daily. (Patient not taking: Reported on 05/08/2020)  . [DISCONTINUED] ciprofloxacin (CIPRO) 500 MG tablet  Take 1 tablet (500 mg total) by mouth 2 (two) times daily. (Patient not taking: Reported on 05/08/2020)  . [DISCONTINUED] gabapentin (NEURONTIN) 100 MG capsule 100 mg. Take 1-2qhs prn (Patient not taking: Reported on 04/10/2020)  . [DISCONTINUED] hydroxychloroquine (PLAQUENIL) 200 MG tablet Take 1 tablet by mouth 2 (two) times daily. (Patient not taking: Reported on 04/10/2020)  . [DISCONTINUED] ibuprofen (ADVIL,MOTRIN) 200  MG tablet Take 400 mg by mouth every 6 (six) hours as needed. (Patient not taking: Reported on 04/10/2020)  . [DISCONTINUED] metroNIDAZOLE (FLAGYL) 500 MG tablet Take 1 tablet (500 mg total) by mouth 3 (three) times daily. (Patient not taking: Reported on 05/08/2020)  . [DISCONTINUED] omeprazole (PRILOSEC) 40 MG capsule Take 1 capsule (40 mg total) by mouth 2 (two) times daily. Take in the morning and at dinnertime. (Patient not taking: Reported on 04/10/2020)   No facility-administered encounter medications on file as of 05/08/2020.     ROS: Gen: no fever, chills  Skin: no rash, itching ENT: no ear pain, ear drainage, nasal congestion, rhinorrhea, sinus pressure, sore throat Eyes: no blurry vision, double vision Resp: no cough, wheeze,SOB CV: no CP, palpitations, LE edema,  GI: no heartburn, n/v/d/c, + abd pain - see HPI GU: no dysuria, urgency, frequency, hematuria MSK: arthritis Neuro: no dizziness, headache, weakness Psych: no depression, anxiety, insomnia   Allergies  Allergen Reactions  . Ultram [Tramadol] Nausea And Vomiting  . Codeine Itching and Nausea And Vomiting  . Hydrocodone-Acetaminophen     REACTION: NAUSEA  . Oxycodone-Acetaminophen     REACTION: NAUSEA    BP 116/70   Pulse 84   Temp 97.9 F (36.6 C) (Temporal)   Ht 5' 5.25" (1.657 m)   Wt 144 lb (65.3 kg)   LMP 10/11/2013   SpO2 98%   BMI 23.78 kg/m   Physical Exam Constitutional:      General: She is not in acute distress.    Appearance: She is well-developed.  HENT:     Head:  Normocephalic and atraumatic.     Right Ear: Tympanic membrane and ear canal normal.     Left Ear: Tympanic membrane and ear canal normal.     Nose: Nose normal.  Eyes:     Conjunctiva/sclera: Conjunctivae normal.     Pupils: Pupils are equal, round, and reactive to light.  Neck:     Thyroid: No thyromegaly.  Cardiovascular:     Rate and Rhythm: Normal rate and regular rhythm.     Heart sounds: Normal heart sounds. No murmur heard.   Pulmonary:     Effort: Pulmonary effort is normal. No respiratory distress.     Breath sounds: Normal breath sounds. No wheezing or rhonchi.  Abdominal:     General: Bowel sounds are normal. There is no distension.     Palpations: Abdomen is soft. There is no mass.     Tenderness: There is no abdominal tenderness.  Musculoskeletal:     Cervical back: Neck supple.     Right lower leg: No edema.     Left lower leg: No edema.  Lymphadenopathy:     Cervical: No cervical adenopathy.  Skin:    General: Skin is warm and dry.  Neurological:     Mental Status: She is alert and oriented to person, place, and time.     Motor: No abnormal muscle tone.     Coordination: Coordination normal.  Psychiatric:        Behavior: Behavior normal.      A/P:  1. Annual physical exam - discussed importance of regular CV exercise, healthy diet, adequate sleep - due for PAP (will schedule with GYN), colonoscopy and mammo UTD - dental exam UTD - immunizations UTD - pt will RTO for fasting labs  2. Insomnia, persistent - chronic issues x years, tried multiple meds but current med and dose is most effective - database reviewed and appropriate  Refill: - zaleplon (SONATA) 10 MG capsule; TAKE 1 CAPSULE BY MOUTH AT BEDTIME AS NEEDED FOR SLEEP  Dispense: 30 capsule; Refill: 2 - f/u in 6 mo  3. Pure hypercholesterolemia - Lipid panel; Future  4. Vitamin D deficiency - VITAMIN D 25 Hydroxy (Vit-D Deficiency, Fractures); Future  5. Need for shingles vaccine -  Varicella-zoster vaccine IM (Shingrix) - RTO in 2-6 mo for #2  6. Family history of diabetes mellitus (DM) - Hemoglobin A1c; Future  7. Left sided abdominal pain - treated for diverticulosis with cipro and flagyl 1 mo ago and overall improved but still some linger discomfort on/off - CMP, lipase, CBC, urine culture WNL - no associated symptoms - monitor x 2-3 wks and if it does not resolve, will consider imaging and possible repeat course of abx   This visit occurred during the SARS-CoV-2 public health emergency.  Safety protocols were in place, including screening questions prior to the visit, additional usage of staff PPE, and extensive cleaning of exam room while observing appropriate contact time as indicated for disinfecting solutions.

## 2020-05-19 ENCOUNTER — Other Ambulatory Visit: Payer: Self-pay

## 2020-05-20 ENCOUNTER — Other Ambulatory Visit (INDEPENDENT_AMBULATORY_CARE_PROVIDER_SITE_OTHER): Payer: 59

## 2020-05-20 DIAGNOSIS — E7800 Pure hypercholesterolemia, unspecified: Secondary | ICD-10-CM

## 2020-05-20 DIAGNOSIS — Z833 Family history of diabetes mellitus: Secondary | ICD-10-CM | POA: Diagnosis not present

## 2020-05-20 DIAGNOSIS — E559 Vitamin D deficiency, unspecified: Secondary | ICD-10-CM

## 2020-05-20 LAB — LIPID PANEL
Cholesterol: 211 mg/dL — ABNORMAL HIGH (ref 0–200)
HDL: 88.6 mg/dL (ref >39.00)
LDL Cholesterol: 109 mg/dL — ABNORMAL HIGH (ref 0–99)
NonHDL: 122.55
Total CHOL/HDL Ratio: 2
Triglycerides: 68 mg/dL (ref 0.0–149.0)
VLDL: 13.6 mg/dL (ref 0.0–40.0)

## 2020-05-20 LAB — VITAMIN D 25 HYDROXY (VIT D DEFICIENCY, FRACTURES): VITD: 34.62 ng/mL (ref 30.00–100.00)

## 2020-05-20 LAB — HEMOGLOBIN A1C: Hgb A1c MFr Bld: 5.7 % (ref 4.6–6.5)

## 2020-05-21 ENCOUNTER — Encounter: Payer: Self-pay | Admitting: Family Medicine

## 2020-05-21 DIAGNOSIS — R7301 Impaired fasting glucose: Secondary | ICD-10-CM | POA: Insufficient documentation

## 2020-09-01 ENCOUNTER — Other Ambulatory Visit: Payer: Self-pay | Admitting: Family Medicine

## 2020-09-01 DIAGNOSIS — G4700 Insomnia, unspecified: Secondary | ICD-10-CM

## 2020-09-14 ENCOUNTER — Other Ambulatory Visit: Payer: Self-pay | Admitting: Obstetrics and Gynecology

## 2020-09-14 DIAGNOSIS — Z1231 Encounter for screening mammogram for malignant neoplasm of breast: Secondary | ICD-10-CM

## 2020-10-30 DIAGNOSIS — H52203 Unspecified astigmatism, bilateral: Secondary | ICD-10-CM | POA: Diagnosis not present

## 2020-10-30 DIAGNOSIS — H524 Presbyopia: Secondary | ICD-10-CM | POA: Diagnosis not present

## 2020-10-30 DIAGNOSIS — H04123 Dry eye syndrome of bilateral lacrimal glands: Secondary | ICD-10-CM | POA: Diagnosis not present

## 2020-10-30 DIAGNOSIS — Z9889 Other specified postprocedural states: Secondary | ICD-10-CM | POA: Diagnosis not present

## 2020-10-30 DIAGNOSIS — H5213 Myopia, bilateral: Secondary | ICD-10-CM | POA: Diagnosis not present

## 2020-10-30 DIAGNOSIS — H2513 Age-related nuclear cataract, bilateral: Secondary | ICD-10-CM | POA: Diagnosis not present

## 2020-11-04 ENCOUNTER — Other Ambulatory Visit: Payer: Self-pay

## 2020-11-04 ENCOUNTER — Ambulatory Visit
Admission: RE | Admit: 2020-11-04 | Discharge: 2020-11-04 | Disposition: A | Discharge status: Home / Self Care | Payer: 59 | Source: Ambulatory Visit | Attending: Obstetrics and Gynecology | Admitting: Obstetrics and Gynecology

## 2020-11-04 DIAGNOSIS — Z1231 Encounter for screening mammogram for malignant neoplasm of breast: Secondary | ICD-10-CM | POA: Diagnosis not present

## 2020-11-12 ENCOUNTER — Other Ambulatory Visit: Payer: Self-pay

## 2020-11-12 ENCOUNTER — Ambulatory Visit (INDEPENDENT_AMBULATORY_CARE_PROVIDER_SITE_OTHER): Payer: 59

## 2020-11-12 DIAGNOSIS — Z8619 Personal history of other infectious and parasitic diseases: Secondary | ICD-10-CM

## 2020-11-12 DIAGNOSIS — Z23 Encounter for immunization: Secondary | ICD-10-CM | POA: Diagnosis not present

## 2020-11-12 NOTE — Progress Notes (Signed)
Pt came into the clinic to receive her second dose of the Shingrix vaccine 0.2ml. This nurse administered the vaccine to the upper left deltoid. Was tolerated well by patient.

## 2020-12-07 ENCOUNTER — Other Ambulatory Visit: Payer: Self-pay | Admitting: Family Medicine

## 2020-12-07 DIAGNOSIS — G4700 Insomnia, unspecified: Secondary | ICD-10-CM

## 2020-12-08 NOTE — Telephone Encounter (Signed)
Last oV 05/08/20 Last fill 09/02/20  #30/2

## 2021-03-24 ENCOUNTER — Encounter: Payer: Self-pay | Admitting: Family Medicine

## 2021-03-24 DIAGNOSIS — G4700 Insomnia, unspecified: Secondary | ICD-10-CM

## 2021-03-24 NOTE — Telephone Encounter (Signed)
Please send to [facility]

## 2021-03-24 NOTE — Telephone Encounter (Signed)
Refill request for:  Zaleplon 10 mg caps LR 12/09/20, #30, 2 rf LOV 10//2/21 (Dr Bryan Lemma) [place]  05/11/21  (TOC)  Please review and advise. Thanks.  Dm/cma

## 2021-03-25 MED ORDER — ZALEPLON 10 MG PO CAPS
ORAL_CAPSULE | ORAL | 2 refills | Status: DC
Start: 2021-03-25 — End: 2021-07-05

## 2021-05-05 DIAGNOSIS — Z01419 Encounter for gynecological examination (general) (routine) without abnormal findings: Secondary | ICD-10-CM | POA: Diagnosis not present

## 2021-05-05 DIAGNOSIS — Z124 Encounter for screening for malignant neoplasm of cervix: Secondary | ICD-10-CM | POA: Diagnosis not present

## 2021-05-11 ENCOUNTER — Encounter: Payer: Self-pay | Admitting: Family Medicine

## 2021-05-11 ENCOUNTER — Ambulatory Visit (INDEPENDENT_AMBULATORY_CARE_PROVIDER_SITE_OTHER): Payer: 59 | Admitting: Family Medicine

## 2021-05-11 ENCOUNTER — Other Ambulatory Visit: Payer: Self-pay

## 2021-05-11 VITALS — BP 126/70 | HR 69 | Temp 96.9°F | Ht 65.0 in | Wt 146.8 lb

## 2021-05-11 DIAGNOSIS — Z0000 Encounter for general adult medical examination without abnormal findings: Secondary | ICD-10-CM

## 2021-05-11 DIAGNOSIS — G4700 Insomnia, unspecified: Secondary | ICD-10-CM

## 2021-05-11 LAB — COMPREHENSIVE METABOLIC PANEL
ALT: 15 U/L (ref 0–35)
AST: 23 U/L (ref 0–37)
Albumin: 4.8 g/dL (ref 3.5–5.2)
Alkaline Phosphatase: 79 U/L (ref 39–117)
BUN: 15 mg/dL (ref 6–23)
CO2: 29 mEq/L (ref 19–32)
Calcium: 10.1 mg/dL (ref 8.4–10.5)
Chloride: 101 mEq/L (ref 96–112)
Creatinine, Ser: 0.85 mg/dL (ref 0.40–1.20)
GFR: 76.16 mL/min (ref >60.00)
Glucose, Bld: 110 mg/dL — ABNORMAL HIGH (ref 70–99)
Potassium: 4.2 mEq/L (ref 3.5–5.1)
Sodium: 138 mEq/L (ref 135–145)
Total Bilirubin: 0.7 mg/dL (ref 0.2–1.2)
Total Protein: 7.6 g/dL (ref 6.0–8.3)

## 2021-05-11 LAB — LIPID PANEL
Cholesterol: 221 mg/dL — ABNORMAL HIGH (ref 0–200)
HDL: 93.5 mg/dL (ref >39.00)
LDL Cholesterol: 118 mg/dL — ABNORMAL HIGH (ref 0–99)
NonHDL: 127.96
Total CHOL/HDL Ratio: 2
Triglycerides: 48 mg/dL (ref 0.0–149.0)
VLDL: 9.6 mg/dL (ref 0.0–40.0)

## 2021-05-11 LAB — CBC
HCT: 41.7 % (ref 36.0–46.0)
Hemoglobin: 14 g/dL (ref 12.0–15.0)
MCHC: 33.5 g/dL (ref 30.0–36.0)
MCV: 91.8 fl (ref 78.0–100.0)
Platelets: 215 10*3/uL (ref 150.0–400.0)
RBC: 4.54 Mil/uL (ref 3.87–5.11)
RDW: 13 % (ref 11.5–15.5)
WBC: 4.6 10*3/uL (ref 4.0–10.5)

## 2021-05-11 LAB — URINALYSIS, ROUTINE W REFLEX MICROSCOPIC
Bilirubin Urine: NEGATIVE
Hgb urine dipstick: NEGATIVE
Ketones, ur: NEGATIVE
Leukocytes,Ua: NEGATIVE
Nitrite: NEGATIVE
RBC / HPF: NONE SEEN (ref 0–?)
Specific Gravity, Urine: <=1.005 — AB (ref 1.000–1.030)
Total Protein, Urine: NEGATIVE
Urine Glucose: NEGATIVE
Urobilinogen, UA: 0.2 (ref 0.0–1.0)
WBC, UA: NONE SEEN (ref 0–?)
pH: 6 (ref 5.0–8.0)

## 2021-05-11 NOTE — Progress Notes (Signed)
Established Patient Office Visit  Subjective:  Patient ID: Katherine [name], female    DOB: 1966/06/16  Age: 55 y.o. MRN: [id number]  CC:  Chief Complaint  Patient presents with   Transitions Of Care    TOC from Dr. Bryan Lemma, no concerns. Patient fasting for labs.     HPI Katherine [name] presents for her yearly physical.  She is doing well.  She is retired from oncology nursing and palliative care.  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  She sees GYN for female care.  Health maintenance is up-to-date.  Suffers from chronic insomnia.  Denies depression or anxiety.  Sonata works were here her.  She uses it throughout the week and sometimes takes a holiday over the weekends.  Past Medical History:  Diagnosis Date   Atypical lobular hyperplasia of left breast    Heart murmur    ECHO 2012-trivial regurg-all else normal   History of chicken pox    Insomnia    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    Past Surgical History:  Procedure Laterality Date   BREAST EXCISIONAL BIOPSY Left    BREAST SURGERY  11/20/13   left breast; Dr Lillia Corporal SECTION  1999   plantar fasciitis surgery  2011, 2007   both feet   TONSILLECTOMY AND ADENOIDECTOMY  1981   uterine fibroidectomy     @ c section    Family History  Adopted: Yes  Problem Relation Age of Onset   Heart attack Maternal Uncle 55   Heart disease Maternal Uncle 37       MI   Cervical cancer Maternal Grandmother    Heart Problems Maternal Grandmother        arrhythmia   Cancer Maternal Grandmother        gyn    Stroke Mother        in 14s   Diabetes Mother    Heart disease Mother    Hyperlipidemia Mother    Hyperlipidemia Brother    Cancer Maternal Aunt        numerous breast cancers   Depression Brother        1/2 brother   Hypertension Other    Hypertension Paternal Grandmother    Hypertension Paternal Grandfather     Social History   Socioeconomic History   Marital status: Married    Spouse name: Katherine [name]   Number of children: 1   Years of education: BSN   Highest education level: Not on file  Occupational History   Occupation: Palliative Care Nurse     Employer: [employer]  Tobacco Use   Smoking status: Former    Types: Cigarettes    Quit date: 10/16/1991    Years since quitting: 29.5   Smokeless tobacco: Never   Tobacco comments:    smoked 1981-1993, up to 1 ppd  Vaping Use   Vaping Use: Never used  Substance and Sexual Activity   Alcohol use: Yes    Comment: occasionally    Drug use: No   Sexual activity: Yes  Comment: 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   Patient has one child.   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      Patient drinks two cups of coffee every morning.   Regular exercise- Tae Kwon Do         Social Determinants of Health   Financial Resource Strain: Not on file  Food Insecurity: Not on file  Transportation Needs: Not on file  Physical Activity: Not on file  Stress: Not on file  Social Connections: Not on file  Intimate Partner Violence: Not on file    Outpatient Medications Prior to Visit  Medication Sig Dispense Refill   Cyanocobalamin (VITAMIN B-12) 2500 MCG SUBL Place under the tongue daily.     ibuprofen (ADVIL) 200 MG tablet Take by mouth.     Misc Natural Products (NARCOSOFT HERBAL LAX PO) Take by mouth.     Multiple Vitamins-Minerals  (CENTRUM SILVER PO) Take by mouth daily.     Probiotic Product (PROBIOTIC DAILY PO) Take by mouth.     zaleplon (SONATA) 10 MG capsule TAKE 1 CAPSULE BY MOUTH AT BEDTIME AS NEEDED FOR SLEEP 30 capsule 2   No facility-administered medications prior to visit.    Allergies  Allergen Reactions   Ultram [Tramadol] Nausea And Vomiting   Codeine Itching and Nausea And Vomiting   Hydrocodone-Acetaminophen     REACTION: NAUSEA   Oxycodone-Acetaminophen     REACTION: NAUSEA    ROS Review of Systems  Constitutional: Negative.   HENT: Negative.    Eyes:  Negative for photophobia and visual disturbance.  Respiratory: Negative.    Cardiovascular: Negative.   Gastrointestinal: Negative.   Endocrine: Negative for polyphagia and polyuria.  Genitourinary: Negative.   Musculoskeletal:  Negative for gait problem and joint swelling.  Neurological:  Negative for facial asymmetry, speech difficulty and weakness.  Psychiatric/Behavioral:  Positive for sleep disturbance. Negative for dysphoric mood. The patient is not nervous/anxious.      Objective:    Physical Exam Vitals and nursing note reviewed.  Constitutional:      General: She is not in acute distress.    Appearance: Normal appearance. She is normal weight. She is not ill-appearing, toxic-appearing or diaphoretic.  HENT:     Head: Normocephalic and atraumatic.     Right Ear: Tympanic membrane, ear canal and external ear normal.     Left Ear: Tympanic membrane, ear canal and external ear normal.     Mouth/Throat:     Mouth: Mucous membranes are moist.     Pharynx: Oropharynx is clear. No oropharyngeal exudate or posterior oropharyngeal erythema.  Eyes:     General: No scleral icterus.       Right eye: No discharge.        Left eye: No discharge.     Extraocular Movements: Extraocular movements intact.     Conjunctiva/sclera: Conjunctivae normal.     Pupils: Pupils are equal, round, and reactive to light.  Neck:     Vascular: No  carotid bruit.  Cardiovascular:     Rate and Rhythm: Normal rate and regular rhythm.  Pulmonary:     Effort: Pulmonary effort is normal.     Breath sounds: Normal breath sounds.  Abdominal:     General: Bowel sounds are normal.  Musculoskeletal:  Cervical back: No rigidity or tenderness.     Right lower leg: No edema.     Left lower leg: No edema.  Lymphadenopathy:     Cervical: No cervical adenopathy.  Skin:    General: Skin is warm and dry.  Neurological:     Mental Status: She is oriented to person, place, and time.  Psychiatric:        Mood and Affect: Mood normal.        Behavior: Behavior normal.        Thought Content: Thought content normal.    BP 126/70 (BP Location: Right Arm, Patient Position: Sitting, Cuff Size: Normal)   Pulse 69   Temp (!) 96.9 F (36.1 C) (Temporal)   Ht _0  (1.651 m)   Wt 146 lb 12.8 oz (66.6 kg)   LMP 10/11/2013   SpO2 99%   BMI 24.43 kg/m  Wt Readings from Last 3 Encounters:  05/11/21 146 lb 12.8 oz (66.6 kg)  05/08/20 144 lb (65.3 kg)  04/10/20 147 lb 3.2 oz (66.8 kg)     Health Maintenance Due  Topic Date Due   Pneumococcal Vaccine 35-53 Years old (1 - PCV) Never done    There are no preventive care reminders to display for this patient.  Lab Results  Component Value Date   TSH 1.03 04/19/2019   Lab Results  Component Value Date   WBC 6.7 04/10/2020   HGB 13.7 04/10/2020   HCT 41.1 04/10/2020   MCV 93.2 04/10/2020   PLT 229 04/10/2020   Lab Results  Component Value Date   NA 139 04/10/2020   K 4.0 04/10/2020   CHLORIDE 105 01/05/2015   CO2 31 04/10/2020   GLUCOSE 112 (H) 04/10/2020   BUN 21 04/10/2020   CREATININE 0.85 04/10/2020   BILITOT 0.5 04/10/2020   ALKPHOS 76 04/19/2019   AST 20 04/10/2020   ALT 14 04/10/2020   PROT 7.7 04/10/2020   ALBUMIN 5.0 04/19/2019   CALCIUM 10.0 04/10/2020   ANIONGAP 9 01/05/2015   EGFR 63 (L) 01/05/2015   GFR 67.56 04/19/2019   Lab Results  Component Value Date    CHOL 211 (H) 05/20/2020   Lab Results  Component Value Date   HDL 88.60 05/20/2020   Lab Results  Component Value Date   LDLCALC 109 (H) 05/20/2020   Lab Results  Component Value Date   TRIG 68.0 05/20/2020   Lab Results  Component Value Date   CHOLHDL 2 05/20/2020   Lab Results  Component Value Date   HGBA1C 5.7 05/20/2020      Assessment & Plan:   Problem List Items Addressed This Visit       Other   Insomnia, persistent - Primary   Other Visit Diagnoses     Healthcare maintenance       Relevant Orders   CBC   Comprehensive metabolic panel   Lipid panel   Urinalysis, Routine w reflex microscopic   Annual physical exam           No orders of the defined types were placed in this encounter.   Follow-up: Return in about 1 year (around 05/11/2022), or if symptoms worsen or fail to improve.  Encouraged her to continue her healthy lifestyle.  Discussed her favorable lipid profile.  Continue walking and will restart her personal calisthenics program.  Continue Sonata as needed for sleep.  Information was given on health maintenance and disease prevention as well as insomnia.  Mortimer Fries  Ethelene Hal, MD

## 2021-07-05 ENCOUNTER — Other Ambulatory Visit: Payer: Self-pay | Admitting: Family Medicine

## 2021-07-05 DIAGNOSIS — G4700 Insomnia, unspecified: Secondary | ICD-10-CM

## 2021-09-17 ENCOUNTER — Other Ambulatory Visit: Payer: Self-pay | Admitting: Family Medicine

## 2021-09-17 DIAGNOSIS — G4700 Insomnia, unspecified: Secondary | ICD-10-CM

## 2021-10-12 ENCOUNTER — Other Ambulatory Visit: Payer: Self-pay | Admitting: Family Medicine

## 2021-10-12 DIAGNOSIS — Z1231 Encounter for screening mammogram for malignant neoplasm of breast: Secondary | ICD-10-CM

## 2021-11-05 ENCOUNTER — Ambulatory Visit: Payer: 59

## 2021-11-05 ENCOUNTER — Ambulatory Visit
Admission: RE | Admit: 2021-11-05 | Discharge: 2021-11-05 | Disposition: A | Discharge status: Home / Self Care | Payer: 59 | Source: Ambulatory Visit | Attending: Family Medicine | Admitting: Family Medicine

## 2021-11-05 DIAGNOSIS — Z1231 Encounter for screening mammogram for malignant neoplasm of breast: Secondary | ICD-10-CM | POA: Diagnosis not present

## 2021-11-08 DIAGNOSIS — H04123 Dry eye syndrome of bilateral lacrimal glands: Secondary | ICD-10-CM | POA: Diagnosis not present

## 2021-11-08 DIAGNOSIS — H524 Presbyopia: Secondary | ICD-10-CM | POA: Diagnosis not present

## 2021-11-08 DIAGNOSIS — H5211 Myopia, right eye: Secondary | ICD-10-CM | POA: Diagnosis not present

## 2021-11-08 DIAGNOSIS — Z9889 Other specified postprocedural states: Secondary | ICD-10-CM | POA: Diagnosis not present

## 2021-11-08 DIAGNOSIS — H52203 Unspecified astigmatism, bilateral: Secondary | ICD-10-CM | POA: Diagnosis not present

## 2021-11-08 DIAGNOSIS — H2513 Age-related nuclear cataract, bilateral: Secondary | ICD-10-CM | POA: Diagnosis not present

## 2021-11-22 ENCOUNTER — Ambulatory Visit: Payer: 59 | Admitting: Family Medicine

## 2021-11-22 ENCOUNTER — Encounter: Payer: Self-pay | Admitting: Family Medicine

## 2021-11-22 VITALS — BP 120/74 | HR 69 | Temp 97.7°F | Ht 65.0 in | Wt 149.0 lb

## 2021-11-22 DIAGNOSIS — R350 Frequency of micturition: Secondary | ICD-10-CM | POA: Diagnosis not present

## 2021-11-22 DIAGNOSIS — L720 Epidermal cyst: Secondary | ICD-10-CM

## 2021-11-22 LAB — POCT URINALYSIS DIPSTICK
Bilirubin, UA: NEGATIVE
Blood, UA: NEGATIVE
Glucose, UA: NEGATIVE
Ketones, UA: NEGATIVE
Leukocytes, UA: NEGATIVE
Nitrite, UA: NEGATIVE
Protein, UA: NEGATIVE
Spec Grav, UA: 1.01 (ref 1.010–1.025)
Urobilinogen, UA: 0.2 E.U./dL
pH, UA: 6 (ref 5.0–8.0)

## 2021-11-22 MED ORDER — SULFAMETHOXAZOLE-TRIMETHOPRIM 800-160 MG PO TABS: 1.0000 | ORAL_TABLET | Freq: Two times a day (BID) | ORAL | 0 refills | Status: AC

## 2021-11-22 NOTE — Progress Notes (Signed)
? ?Established Patient Office Visit ? ?Subjective   ?Patient ID: Katherine [name], female    DOB: 1966-04-08  Age: 56 y.o. MRN: [id number] ? ?Chief Complaint  ?Patient presents with  ? Mass  ?  Cyst on inner right thigh x 8 years come and go becoming larger in size and painful x 2 days. Possible UTI complains of urine frequency little irritating when urinating.   ? ? ?HPI evaluation of a tender spot in the right inner thigh.  Is been there for years.  Over the last several months it is increased in size.  There has been no drainage, erythema fever or chills.  It has never been drained.  She is having some tenderness proximal and medial to the tender area.  She has been experiencing urinary frequency with mild dysuria.  There has been no fever chills nausea or vomiting. ? ? ? ?Review of Systems  ?Constitutional: Negative.   ?HENT: Negative.    ?Eyes:  Negative for blurred vision, discharge and redness.  ?Respiratory: Negative.    ?Cardiovascular: Negative.   ?Gastrointestinal:  Negative for abdominal pain.  ?Genitourinary:  Positive for frequency. Negative for dysuria, flank pain, hematuria and urgency.  ?Musculoskeletal: Negative.  Negative for myalgias.  ?Skin:  Negative for rash.  ?Neurological:  Negative for tingling, loss of consciousness and weakness.  ?Endo/Heme/Allergies:  Negative for polydipsia.  ? ?  ?Objective:  ?  ? ?BP 120/74 (BP Location: Left Arm, Patient Position: Sitting, Cuff Size: Normal)   Pulse 69   Temp 97.7 ?F (36.5 ?C) (Temporal)   Ht '5\' 5"'$  (1.651 m)   Wt 149 lb (67.6 kg)   LMP 10/11/2013   SpO2 100%   BMI 24.79 kg/m?  ? ? ?Physical Exam ?Constitutional:   ?   General: She is not in acute distress. ?   Appearance: Normal appearance. She is not ill-appearing, toxic-appearing or diaphoretic.  ?HENT:  ?   Head: Normocephalic and atraumatic.  ?   Right Ear: External ear normal.  ?   Left Ear: External ear normal.  ?Pulmonary:  ?   Effort: Pulmonary effort is normal. No respiratory distress.   ?Musculoskeletal:  ?   Cervical back: No rigidity or tenderness.  ?Skin: ?   General: Skin is warm and dry.  ? ?    ?Neurological:  ?   Mental Status: She is alert and oriented to person, place, and time.  ?Psychiatric:     ?   Mood and Affect: Mood normal.     ?   Behavior: Behavior normal.  ? ? ? ?Results for orders placed or performed in visit on 11/22/21  ?POCT Urinalysis Dipstick  ?Result Value Ref Range  ? Color, UA yellow   ? Clarity, UA clear   ? Glucose, UA Negative Negative  ? Bilirubin, UA negative   ? Ketones, UA negative   ? Spec Grav, UA 1.010 1.010 - 1.025  ? Blood, UA negative   ? pH, UA 6.0 5.0 - 8.0  ? Protein, UA Negative Negative  ? Urobilinogen, UA 0.2 0.2 or 1.0 E.U./dL  ? Nitrite, UA negative   ? Leukocytes, UA Negative Negative  ? Appearance    ? Odor    ? ? ? ? ?The 10-year ASCVD risk score (Arnett DK, et al., 2019) is: 1.5% ? ?  ?Assessment & Plan:  ? ?Problem List Items Addressed This Visit   ?None ?Visit Diagnoses   ? ? Urinary frequency    -  Primary  ?  Relevant Orders  ? Urinalysis, Routine w reflex microscopic  ? Urine Culture  ? POCT Urinalysis Dipstick (Completed)  ? Inclusion cyst      ? Relevant Medications  ? sulfamethoxazole-trimethoprim (BACTRIM DS) 800-160 MG tablet  ? Other Relevant Orders  ? Ambulatory referral to General Surgery  ? ?  ? ? ?No follow-ups on file.  ?Early inclusion cyst versus inflamed lipoma?  Surgical referral.  Needs to be excised. ? ?Libby Maw, MD ? ?

## 2021-11-23 LAB — URINALYSIS, ROUTINE W REFLEX MICROSCOPIC
Bilirubin Urine: NEGATIVE
Hgb urine dipstick: NEGATIVE
Ketones, ur: NEGATIVE
Nitrite: NEGATIVE
Specific Gravity, Urine: <=1.005 — AB (ref 1.000–1.030)
Total Protein, Urine: NEGATIVE
Urine Glucose: NEGATIVE
Urobilinogen, UA: 0.2 (ref 0.0–1.0)
pH: 6 (ref 5.0–8.0)

## 2021-11-24 LAB — URINE CULTURE
MICRO NUMBER:: 13364924
SPECIMEN QUALITY:: ADEQUATE

## 2021-11-25 ENCOUNTER — Other Ambulatory Visit: Payer: Self-pay | Admitting: Family Medicine

## 2021-11-25 DIAGNOSIS — G4700 Insomnia, unspecified: Secondary | ICD-10-CM

## 2021-12-10 DIAGNOSIS — L720 Epidermal cyst: Secondary | ICD-10-CM | POA: Diagnosis not present

## 2022-01-03 ENCOUNTER — Other Ambulatory Visit: Payer: Self-pay | Admitting: Surgery

## 2022-01-03 DIAGNOSIS — D1723 Benign lipomatous neoplasm of skin and subcutaneous tissue of right leg: Secondary | ICD-10-CM | POA: Diagnosis not present

## 2022-02-02 ENCOUNTER — Other Ambulatory Visit: Payer: Self-pay | Admitting: Family Medicine

## 2022-02-02 DIAGNOSIS — G4700 Insomnia, unspecified: Secondary | ICD-10-CM

## 2022-04-20 ENCOUNTER — Encounter: Payer: Self-pay | Admitting: Family Medicine

## 2022-04-26 ENCOUNTER — Other Ambulatory Visit: Payer: 59

## 2022-05-10 ENCOUNTER — Encounter: Payer: Self-pay | Admitting: Family Medicine

## 2022-05-10 ENCOUNTER — Ambulatory Visit: Payer: 59

## 2022-05-12 ENCOUNTER — Ambulatory Visit: Payer: 59

## 2022-05-13 ENCOUNTER — Encounter: Payer: 59 | Admitting: Family Medicine

## 2022-05-27 ENCOUNTER — Encounter: Payer: Self-pay | Admitting: Family Medicine

## 2022-05-27 ENCOUNTER — Ambulatory Visit (INDEPENDENT_AMBULATORY_CARE_PROVIDER_SITE_OTHER): Payer: 59 | Admitting: Family Medicine

## 2022-05-27 VITALS — BP 142/82 | HR 74 | Temp 97.3°F | Ht 65.0 in | Wt 148.4 lb

## 2022-05-27 DIAGNOSIS — G4700 Insomnia, unspecified: Secondary | ICD-10-CM | POA: Diagnosis not present

## 2022-05-27 DIAGNOSIS — Z0000 Encounter for general adult medical examination without abnormal findings: Secondary | ICD-10-CM

## 2022-05-27 MED ORDER — ZALEPLON 10 MG PO CAPS: ORAL_CAPSULE | ORAL | 2 refills | Status: DC

## 2022-05-27 NOTE — Progress Notes (Signed)
Established Patient Office Visit  Subjective   Patient ID: Katherine [name], female    DOB: 01-09-1966  Age: 56 y.o. MRN: [id number]  Chief Complaint  Patient presents with   Annual Exam    CPE Pt fasting  No concerns     HPI doing well.  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  Is busy caring for elderly relatives along with her husband.  She has regular dental care and is exercising daily by walking her dog for at least a mile and a half.  No history of hypertension.  Things have been a little bit stressful at work today.  Female care is through GYN.    Review of Systems  Constitutional: Negative.   HENT: Negative.    Eyes:  Negative for blurred vision, discharge and redness.  Respiratory: Negative.    Cardiovascular: Negative.   Gastrointestinal:  Negative for abdominal pain.  Genitourinary: Negative.   Musculoskeletal: Negative.  Negative for myalgias.  Skin:  Negative for rash.  Neurological:  Negative for tingling, loss of consciousness and weakness.  Endo/Heme/Allergies:  Negative for polydipsia.      Objective:     BP (!) 142/82 (BP Location: Right Arm, Patient Position: Sitting, Cuff Size: Small)   Pulse 74   Temp (!) 97.3 F (36.3 C) (Temporal)   Ht '5\' 5"'$  (1.651 m)   Wt 148 lb 6.4 oz (67.3 kg)   LMP 10/11/2013   SpO2 97%   BMI 24.70 kg/m  BP Readings from Last 3 Encounters:  05/27/22 (!) 142/82  11/22/21 120/74  05/11/21 126/70   Wt Readings from Last 3 Encounters:  05/27/22 148 lb 6.4 oz (67.3 kg)  11/22/21 149 lb (67.6 kg)  05/11/21 146 lb 12.8 oz (66.6 kg)      Physical Exam Constitutional:      General: She is not in acute distress.    Appearance: Normal appearance. She is not ill-appearing, toxic-appearing or diaphoretic.  HENT:     Head: Normocephalic and atraumatic.     Right Ear: External ear normal.     Left Ear: External ear normal.     Mouth/Throat:     Mouth: Mucous membranes are moist.     Pharynx: Oropharynx is clear. No  oropharyngeal exudate or posterior oropharyngeal erythema.  Eyes:     General: No scleral icterus.       Right eye: No discharge.        Left eye: No discharge.     Extraocular Movements: Extraocular movements intact.     Conjunctiva/sclera: Conjunctivae normal.     Pupils: Pupils are equal, round, and reactive to light.  Cardiovascular:     Rate and Rhythm: Normal rate and regular rhythm.  Pulmonary:     Effort: Pulmonary effort is normal. No respiratory distress.     Breath sounds: Normal breath sounds.  Abdominal:     General: Bowel sounds are normal.  Musculoskeletal:     Cervical back: No rigidity or tenderness.  Skin:    General: Skin is warm and dry.  Neurological:     Mental Status: She is alert and oriented to person, place, and time.  Psychiatric:        Mood and Affect: Mood normal.        Behavior: Behavior normal.      No results found for any visits on 05/27/22.    The 10-year ASCVD risk score (Arnett DK, et al., 2019) is: 2.4%    Assessment &  Plan:   Problem List Items Addressed This Visit       Other   Insomnia, persistent   Relevant Medications   zaleplon (SONATA) 10 MG capsule   Other Visit Diagnoses     Healthcare maintenance    -  Primary   Relevant Orders   Urinalysis, Routine w reflex microscopic   Lipid panel   Comprehensive metabolic panel   CBC   Hemoglobin A1c       Return in about 1 year (around 05/28/2023), or if symptoms worsen or fail to improve.  Her blood pressures always been normal.  There is recent stress today.  She will keep an eye on it and let me know if it averages over 140/90.  Continue Sonata as needed as needed  Libby Maw, MD

## 2022-05-28 LAB — HEMOGLOBIN A1C
Hgb A1c MFr Bld: 5.5 % of total Hgb (ref <5.7)
Mean Plasma Glucose: 111 mg/dL
eAG (mmol/L): 6.2 mmol/L

## 2022-05-30 LAB — CBC
HCT: 43.1 % (ref 35.0–45.0)
Hemoglobin: 14.6 g/dL (ref 11.7–15.5)
MCH: 31.2 pg (ref 27.0–33.0)
MCHC: 33.9 g/dL (ref 32.0–36.0)
MCV: 92.1 fL (ref 80.0–100.0)
MPV: 10.7 fL (ref 7.5–12.5)
Platelets: 237 10*3/uL (ref 140–400)
RBC: 4.68 10*6/uL (ref 3.80–5.10)
RDW: 12.4 % (ref 11.0–15.0)
WBC: 6 10*3/uL (ref 3.8–10.8)

## 2022-05-30 LAB — COMPREHENSIVE METABOLIC PANEL
AG Ratio: 1.7 (calc) (ref 1.0–2.5)
ALT: 16 U/L (ref 6–29)
AST: 23 U/L (ref 10–35)
Albumin: 5 g/dL (ref 3.6–5.1)
Alkaline phosphatase (APISO): 75 U/L (ref 37–153)
BUN: 18 mg/dL (ref 7–25)
CO2: 27 mmol/L (ref 20–32)
Calcium: 10.5 mg/dL — ABNORMAL HIGH (ref 8.6–10.4)
Chloride: 100 mmol/L (ref 98–110)
Creat: 0.86 mg/dL (ref 0.50–1.03)
Globulin: 2.9 g/dL (calc) (ref 1.9–3.7)
Glucose, Bld: 92 mg/dL (ref 65–99)
Potassium: 4.4 mmol/L (ref 3.5–5.3)
Sodium: 137 mmol/L (ref 135–146)
Total Bilirubin: 0.7 mg/dL (ref 0.2–1.2)
Total Protein: 7.9 g/dL (ref 6.1–8.1)

## 2022-05-30 LAB — LIPID PANEL
Cholesterol: 237 mg/dL — ABNORMAL HIGH (ref <200)
HDL: 107 mg/dL (ref >=50)
LDL Cholesterol (Calc): 116 mg/dL (calc) — ABNORMAL HIGH
Non-HDL Cholesterol (Calc): 130 mg/dL (calc) — ABNORMAL HIGH (ref <130)
Total CHOL/HDL Ratio: 2.2 (calc) (ref <5.0)
Triglycerides: 53 mg/dL (ref <150)

## 2022-05-30 LAB — URINALYSIS, ROUTINE W REFLEX MICROSCOPIC

## 2022-05-30 NOTE — Addendum Note (Signed)
Addended by: Lynnea Ferrier on: 05/30/2022 08:17 AM   Modules accepted: Orders

## 2022-06-02 ENCOUNTER — Telehealth: Payer: 59 | Admitting: Family Medicine

## 2022-06-02 DIAGNOSIS — H44002 Unspecified purulent endophthalmitis, left eye: Secondary | ICD-10-CM

## 2022-06-02 MED ORDER — POLYMYXIN B-TRIMETHOPRIM 10000-0.1 UNIT/ML-% OP SOLN: 1.0000 [drp] | Freq: Four times a day (QID) | OPHTHALMIC | 0 refills | Status: DC

## 2022-06-02 NOTE — Patient Instructions (Signed)
  Argie Ramming Toohey, thank you for joining [name], NP for today's virtual visit.  While this provider is not your primary care provider (PCP), if your PCP is located in our provider database this encounter information will be shared with them immediately following your visit.   [place] account gives you access to today's visit and all your visits, tests, and labs performed at [facility] " click here if you don't have a [facility] account or go to [email or web address]  Consent: (Patient) Katherine [name] provided verbal consent for this virtual visit at the beginning of the encounter.  Current Medications:  Current Outpatient Medications:    trimethoprim-polymyxin b (POLYTRIM) ophthalmic solution, Place 1 drop into the right eye in the morning, at noon, in the evening, and at bedtime. 5 days, Disp: 10 mL, Rfl: 0   Cyanocobalamin (VITAMIN B-12) 2500 MCG SUBL, Place under the tongue daily., Disp: , Rfl:    ibuprofen (ADVIL) 200 MG tablet, Take by mouth., Disp: , Rfl:    Misc Natural Products (NARCOSOFT HERBAL LAX PO), Take by mouth., Disp: , Rfl:    Multiple Vitamins-Minerals (CENTRUM SILVER PO), Take by mouth daily., Disp: , Rfl:    Probiotic Product (PROBIOTIC DAILY PO), Take by mouth., Disp: , Rfl:    zaleplon (SONATA) 10 MG capsule, TAKE 1 CAPSULE BY MOUTH AT BEDTIME AS NEEDED FOR SLEEP, Disp: 30 capsule, Rfl: 2   Medications ordered in this encounter:  Meds ordered this encounter  Medications   trimethoprim-polymyxin b (POLYTRIM) ophthalmic solution    Sig: Place 1 drop into the right eye in the morning, at noon, in the evening, and at bedtime. 5 days    Dispense:  10 mL    Refill:  0    Order Specific Question:   Supervising Provider    Answer:   Chase Picket [[id number]]     *If you need refills on other medications prior to your next appointment, please contact your pharmacy*  Follow-Up: Call back or seek an in-person evaluation if the  symptoms worsen or if the condition fails to improve as anticipated.  [facility] [phone]  Other Instructions  -keep clean and dry -warm compresses -avoid rubbing, pat dry -change pillowcases -toss make up out that has been recently used -avoid contacts or products in or on the eye until it has improved.  If you develop vision changes, pain with eye movement or changes or worsening in condition please go be seen in person.    If you have been instructed to have an in-person evaluation today at a local Urgent Care facility, please use the link below. It will take you to a list of all of our available [facility], including address, phone number and hours of operation. Please do not delay care.  [facility]  If you or a family member do not have a primary care provider, use the link below to schedule a visit and establish care. When you choose a [facility] primary care physician or advanced practice provider, you gain a long-term partner in health. Find a Primary Care Provider  Learn more about [facility]'s in-office and virtual care options: [facility]

## 2022-06-02 NOTE — Progress Notes (Signed)
Virtual Visit Consent   Katherine [name], you are scheduled for a virtual visit with a [facility] provider today. Just as with appointments in the office, your consent must be obtained to participate. Your consent will be active for this visit and any virtual visit you may have with one of our providers in the next 365 days. If you have a MyChart account, a copy of this consent can be sent to you electronically.  As this is a virtual visit, video technology does not allow for your provider to perform a traditional examination. This may limit your provider's ability to fully assess your condition. If your provider identifies any concerns that need to be evaluated in person or the need to arrange testing (such as labs, EKG, etc.), we will make arrangements to do so. Although advances in technology are sophisticated, we cannot ensure that it will always work on either your end or our end. If the connection with a video visit is poor, the visit may have to be switched to a telephone visit. With either a video or telephone visit, we are not always able to ensure that we have a secure connection.  By engaging in this virtual visit, you consent to the provision of healthcare and authorize for your insurance to be billed (if applicable) for the services provided during this visit. Depending on your insurance coverage, you may receive a charge related to this service.  I need to obtain your verbal consent now. Are you willing to proceed with your visit today? Katherine [name] has provided verbal consent on 06/02/2022 for a virtual visit (video or telephone). Katherine Mayo, NP  Date: 06/02/2022 3:16 PM  Virtual Visit via Video Note   I, Katherine [name], connected with  Katherine [name]  ([id number], Feb 07, 1966) on 06/02/22 at  3:15 PM EST by a video-enabled telemedicine application and verified that I am speaking with the correct person using two identifiers.  Location: Patient: Virtual Visit Location Patient:  Home Provider: Virtual Visit Location Provider: Home Office   I discussed the limitations of evaluation and management by telemedicine and the availability of in person appointments. The patient expressed understanding and agreed to proceed.    History of Present Illness: Katherine [name] is a 56 y.o. 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/10/2017   Paresthesias 05/09/2017   Dense breast tissue 01/30/2016   Estrogen deficiency 07/11/2015   Vasomotor instability 07/11/2015   History of chicken pox    Atypical lobular hyperplasia of left breast    Vitamin D deficiency 01/01/2014   Other abnormal glucose 12/24/2013   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   Insomnia, persistent 08/28/2013   FATIGUE 03/03/2009   PALPITATIONS 03/03/2009   Anxiety state 11/30/2007   PURE HYPERCHOLESTEROLEMIA 11/29/2007   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   FASCIITIS, PLANTAR 11/29/2007   HEART MURMUR, HX OF 11/29/2007    Allergies:  Allergies  Allergen Reactions   Ultram [Tramadol] Nausea And Vomiting   Codeine Itching and Nausea And Vomiting   Hydrocodone-Acetaminophen  REACTION: NAUSEA   Oxycodone-Acetaminophen     REACTION: NAUSEA   Medications:  Current Outpatient Medications:    Cyanocobalamin (VITAMIN B-12) 2500 MCG SUBL, Place under the tongue daily., Disp: , Rfl:    ibuprofen (ADVIL) 200 MG tablet, Take by mouth., Disp: , Rfl:    Misc Natural Products (NARCOSOFT HERBAL LAX PO), Take by mouth., Disp: , Rfl:    Multiple Vitamins-Minerals (CENTRUM SILVER PO),  Take by mouth daily., Disp: , Rfl:    Probiotic Product (PROBIOTIC DAILY PO), Take by mouth., Disp: , Rfl:    zaleplon (SONATA) 10 MG capsule, TAKE 1 CAPSULE BY MOUTH AT BEDTIME AS NEEDED FOR SLEEP, Disp: 30 capsule, Rfl: 2  Observations/Objective: Patient is well-developed, well-nourished in no acute distress.  Resting comfortably  at home.  Head is normocephalic, atraumatic.  No labored breathing.  Speech is clear and coherent with logical content.  Patient is alert and oriented at baseline.  Right eye is red and top eye lid is noted to have mild swelling Eye movement is intact  Assessment and Plan: 1. Eye infection, left  - trimethoprim-polymyxin b (POLYTRIM) ophthalmic solution; Place 1 drop into the right eye in the morning, at noon, in the evening, and at bedtime. 5 days  Dispense: 10 mL; Refill: 0   -keep clean and dry -warm compresses -avoid rubbing, pat dry -change pillowcases -toss make up out that has been recently used -avoid contacts or products in or on the eye until it has improved.  If you develop vision changes, pain with eye movement or changes or worsening in condition please go be seen in person.    Reviewed side effects, risks and benefits of medication.    Patient acknowledged agreement and understanding of the plan.   Past Medical, Surgical, Social History, Allergies, and Medications have been Reviewed.   Follow Up Instructions: I discussed the assessment and treatment plan with the patient. The patient was provided an opportunity to ask questions and all were answered. The patient agreed with the plan and demonstrated an understanding of the instructions.  A copy of instructions were sent to the patient via MyChart unless otherwise noted below.    The patient was advised to call back or seek an in-person evaluation if the symptoms worsen or if the condition fails to improve as anticipated.  Time:  I spent 9 minutes with the patient via telehealth  technology discussing the above problems/concerns.    Katherine Mayo, NP

## 2022-07-23 ENCOUNTER — Ambulatory Visit
Admission: EM | Admit: 2022-07-23 | Discharge: 2022-07-23 | Disposition: A | Discharge status: Home / Self Care | Payer: 59 | Attending: Internal Medicine | Admitting: Internal Medicine

## 2022-07-23 ENCOUNTER — Ambulatory Visit (INDEPENDENT_AMBULATORY_CARE_PROVIDER_SITE_OTHER): Payer: 59

## 2022-07-23 DIAGNOSIS — S62632A Displaced fracture of distal phalanx of right middle finger, initial encounter for closed fracture: Secondary | ICD-10-CM

## 2022-07-23 DIAGNOSIS — T148XXA Other injury of unspecified body region, initial encounter: Secondary | ICD-10-CM | POA: Diagnosis not present

## 2022-07-23 DIAGNOSIS — M79644 Pain in right finger(s): Secondary | ICD-10-CM | POA: Diagnosis not present

## 2022-07-23 DIAGNOSIS — S62639A Displaced fracture of distal phalanx of unspecified finger, initial encounter for closed fracture: Secondary | ICD-10-CM

## 2022-07-23 DIAGNOSIS — S6991XA Unspecified injury of right wrist, hand and finger(s), initial encounter: Secondary | ICD-10-CM | POA: Diagnosis not present

## 2022-07-23 NOTE — Discharge Instructions (Addendum)
Wear the splint as much as you can. Use ibuprofen at '600mg'$  once every 6 hours as needed for pain and inflammation. Follow up with [facility] as soon as possible.

## 2022-07-23 NOTE — ED Provider Notes (Addendum)
[facility]  Note:  This document was prepared using Systems analyst and may include unintentional dictation errors.  MRN: [id number] DOB: 02-12-1966  Subjective:   Katherine [name] is a 57 y.o. female presenting for 1 day history of of a right middle finger injury. Patient tried popping open the foil lid on a peanut butter jar. She jabbed the finger directly into the foil twice causing her symptoms. Has a deformity of the end of her finger.   No current facility-administered medications for this encounter.  Current Outpatient Medications:    Cyanocobalamin (VITAMIN B-12) 2500 MCG SUBL, Place under the tongue daily., Disp: , Rfl:    ibuprofen (ADVIL) 200 MG tablet, Take by mouth., Disp: , Rfl:    Misc Natural Products (NARCOSOFT HERBAL LAX PO), Take by mouth., Disp: , Rfl:    Multiple Vitamins-Minerals (CENTRUM SILVER PO), Take by mouth daily., Disp: , Rfl:    Probiotic Product (PROBIOTIC DAILY PO), Take by mouth., Disp: , Rfl:    trimethoprim-polymyxin b (POLYTRIM) ophthalmic solution, Place 1 drop into the right eye in the morning, at noon, in the evening, and at bedtime. 5 days, Disp: 10 mL, Rfl: 0   zaleplon (SONATA) 10 MG capsule, TAKE 1 CAPSULE BY MOUTH AT BEDTIME AS NEEDED FOR SLEEP, Disp: 30 capsule, Rfl: 2   Allergies  Allergen Reactions   Ultram [Tramadol] Nausea And Vomiting   Codeine Itching and Nausea And Vomiting   Hydrocodone-Acetaminophen     REACTION: NAUSEA   Oxycodone-Acetaminophen     REACTION: NAUSEA    Past Medical History:  Diagnosis Date   Atypical lobular hyperplasia of left breast    Heart murmur    ECHO 2012-trivial regurg-all else normal   History of chicken pox    Insomnia    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     Past Surgical History:  Procedure Laterality Date   BREAST EXCISIONAL BIOPSY Left    BREAST SURGERY  11/20/13   left breast; Dr Lillia Corporal SECTION  1999   plantar fasciitis surgery  2011, 2007   both feet   TONSILLECTOMY AND ADENOIDECTOMY  1981   uterine fibroidectomy     @ c section    Family History  Adopted: Yes  Problem Relation Age of Onset   Heart attack Maternal Uncle 37   Heart disease Maternal Uncle 37       MI   Cervical cancer Maternal Grandmother    Heart Problems Maternal Grandmother        arrhythmia   Cancer Maternal Grandmother        gyn   Stroke Mother        in 22s   Diabetes Mother    Heart disease Mother    Hyperlipidemia Mother    Hyperlipidemia Brother    Cancer Maternal Aunt        numerous breast cancers   Depression Brother        1/2 brother   Hypertension Other    Hypertension Paternal Grandmother    Hypertension Paternal  Grandfather     Social History   Tobacco Use   Smoking status: Former    Types: Cigarettes    Quit date: 10/16/1991    Years since quitting: 30.7   Smokeless tobacco: Never   Tobacco comments:    smoked 1981-1993, up to 1 ppd  Vaping Use   Vaping Use: Never used  Substance Use Topics   Alcohol use: Yes    Comment: occasionally    Drug use: No    ROS   Objective:   Vitals: BP (!) 150/89 (BP Location: Left Arm)   Pulse 70   Temp 97.8 F (36.6 C) (Oral)   Resp 16   LMP 10/11/2013   SpO2 98%   Physical Exam Constitutional:      General: She is not in acute distress.    Appearance: Normal appearance. She is well-developed. She is not ill-appearing, toxic-appearing or diaphoretic.  HENT:     Head: Normocephalic and atraumatic.     Nose: Nose normal.     Mouth/Throat:     Mouth: Mucous  membranes are moist.  Eyes:     General: No scleral icterus.       Right eye: No discharge.        Left eye: No discharge.     Extraocular Movements: Extraocular movements intact.  Cardiovascular:     Rate and Rhythm: Normal rate.  Pulmonary:     Effort: Pulmonary effort is normal.  Musculoskeletal:     Right hand: Swelling, deformity, tenderness and bony tenderness present. No lacerations. Decreased range of motion. Normal strength. Normal sensation. There is no disruption of two-point discrimination. Normal capillary refill.       Hands:  Skin:    General: Skin is warm and dry.  Neurological:     General: No focal deficit present.     Mental Status: She is alert and oriented to person, place, and time.  Psychiatric:        Mood and Affect: Mood normal.        Behavior: Behavior normal.     DG Finger Middle Right  Result Date: 07/23/2022 CLINICAL DATA:  Finger injury last night. EXAM: RIGHT MIDDLE FINGER 2+V COMPARISON:  None Available. FINDINGS: The mineralization is normal. The long finger is flexed at the distal interphalangeal joint. Dorsal to the middle phalangeal head, there is a small rounded ossific density which could reflect an avulsion fracture or small foreign body. No other evidence of acute fracture or dislocation. There are mild underlying interphalangeal degenerative changes. IMPRESSION: Flexion at the distal interphalangeal joint with small rounded ossific density dorsal to the middle phalangeal head which could reflect an avulsion fracture or small foreign body. Avulsion fracture more likely if the patient is unable to extend the DIP joint. Electronically Signed   By: Richardean Sale M.D.   On: 07/23/2022 13:40     Assessment and Plan :   PDMP not reviewed this encounter.  1. Displaced fracture of distal phalanx of right middle finger, initial encounter for closed fracture   2. Avulsion injury   3. Pain of right middle finger     A static finger splint was  applied to the DIP of the third right finger.  Discussed general management and advised ibuprofen, patient declined prescription.  Follow-up with [facility]. Counseled patient on potential for adverse effects with medications prescribed/recommended today, ER and return-to-clinic precautions discussed, patient verbalized understanding.     Jaynee Eagles, PA-C 07/23/22 [place],  Freida Busman, PA-C 07/27/22 1002

## 2022-07-23 NOTE — ED Triage Notes (Signed)
Pt injured right middle finger last night when she attempted to punch finger through foil lid on peanut can-deformity noted-NAD-steady gait

## 2022-09-02 ENCOUNTER — Telehealth: Payer: 59 | Admitting: Physician Assistant

## 2022-09-02 DIAGNOSIS — U071 COVID-19: Secondary | ICD-10-CM | POA: Diagnosis not present

## 2022-09-02 MED ORDER — NIRMATRELVIR/RITONAVIR (PAXLOVID)TABLET: 3.0000 | ORAL_TABLET | Freq: Two times a day (BID) | ORAL | 0 refills | Status: AC

## 2022-09-02 NOTE — Patient Instructions (Signed)
[name], thank you for joining [name], PA-C for today's virtual visit.  While this provider is not your primary care provider (PCP), if your PCP is located in our provider database this encounter information will be shared with them immediately following your visit.   [facility] account gives you access to today's visit and all your visits, tests, and labs performed at [facility] " click here if you don't have a [facility] account or go to [email or web address]  Consent: (Patient) Katherine [name] provided verbal consent for this virtual visit at the beginning of the encounter.  Current Medications:  Current Outpatient Medications:    Cyanocobalamin (VITAMIN B-12) 2500 MCG SUBL, Place under the tongue daily., Disp: , Rfl:    ibuprofen (ADVIL) 200 MG tablet, Take by mouth., Disp: , Rfl:    Misc Natural Products (NARCOSOFT HERBAL LAX PO), Take by mouth., Disp: , Rfl:    Multiple Vitamins-Minerals (CENTRUM SILVER PO), Take by mouth daily., Disp: , Rfl:    Probiotic Product (PROBIOTIC DAILY PO), Take by mouth., Disp: , Rfl:    trimethoprim-polymyxin b (POLYTRIM) ophthalmic solution, Place 1 drop into the right eye in the morning, at noon, in the evening, and at bedtime. 5 days, Disp: 10 mL, Rfl: 0   zaleplon (SONATA) 10 MG capsule, TAKE 1 CAPSULE BY MOUTH AT BEDTIME AS NEEDED FOR SLEEP, Disp: 30 capsule, Rfl: 2   Medications ordered in this encounter:  No orders of the defined types were placed in this encounter.    *If you need refills on other medications prior to your next appointment, please contact your pharmacy*  Follow-Up: Call back or seek an in-person evaluation if the symptoms worsen or if the condition fails to improve as anticipated.  [place] [phone]  Other Instructions Please keep well-hydrated and get plenty of rest. Start a saline nasal rinse to flush out your nasal passages. You can use plain  Mucinex to help thin congestion. If you have a humidifier, running in the bedroom at night. I want you to start OTC vitamin D3 1000 units daily, vitamin C 1000 mg daily, and a zinc supplement. Please take prescribed medications as directed.  You have been enrolled in a MyChart symptom monitoring program. Please answer these questions daily so we can keep track of how you are doing.  You were to quarantine for 5 days from onset of your symptoms.  After day 5, if you have had no fever and you are feeling better, you can end quarantine but need to mask for an additional 5 days. After day 5 if you have a fever or are having significant symptoms, please quarantine for full 10 days.  If you note any worsening of symptoms, any significant shortness of breath or any chest pain, please seek ER evaluation ASAP.  Please do not delay care!  COVID-19: What to Do if You Are Sick If you test positive and are an older adult or someone who is at high risk of getting very sick from COVID-19, treatment may be available. Contact a healthcare provider right away after a positive test to determine if you are eligible, even if your symptoms are mild right now. You can also visit a Test to Treat location and, if eligible, receive a prescription from a provider. Don't delay: Treatment must be started within the first few days to be effective. If you have a fever, cough, or other symptoms, you might have COVID-19. Most  people have mild illness and are able to recover at home. If you are sick: Keep track of your symptoms. If you have an emergency warning sign (including trouble breathing), call 911. Steps to help prevent the spread of COVID-19 if you are sick If you are sick with COVID-19 or think you might have COVID-19, follow the steps below to care for yourself and to help protect other people in your home and community. Stay home except to get medical care Stay home. Most people with COVID-19 have mild illness and  can recover at home without medical care. Do not leave your home, except to get medical care. Do not visit public areas and do not go to places where you are unable to wear a mask. Take care of yourself. Get rest and stay hydrated. Take over-the-counter medicines, such as acetaminophen, to help you feel better. Stay in touch with your doctor. Call before you get medical care. Be sure to get care if you have trouble breathing, or have any other emergency warning signs, or if you think it is an emergency. Avoid public transportation, ride-sharing, or taxis if possible. Get tested If you have symptoms of COVID-19, get tested. While waiting for test results, stay away from others, including staying apart from those living in your household. Get tested as soon as possible after your symptoms start. Treatments may be available for people with COVID-19 who are at risk for becoming very sick. Don't delay: Treatment must be started early to be effective--some treatments must begin within 5 days of your first symptoms. Contact your healthcare provider right away if your test result is positive to determine if you are eligible. Self-tests are one of several options for testing for the virus that causes COVID-19 and may be more convenient than laboratory-based tests and point-of-care tests. Ask your healthcare provider or your local health department if you need help interpreting your test results. You can visit your state, tribal, local, and territorial health department's website to look for the latest local information on testing sites. Separate yourself from other people As much as possible, stay in a specific room and away from other people and pets in your home. If possible, you should use a separate bathroom. If you need to be around other people or animals in or outside of the home, wear a well-fitting mask. Tell your close contacts that they may have been exposed to COVID-19. An infected person can spread  COVID-19 starting 48 hours (or 2 days) before the person has any symptoms or tests positive. By letting your close contacts know they may have been exposed to COVID-19, you are helping to protect everyone. See COVID-19 and Animals if you have questions about pets. If you are diagnosed with COVID-19, someone from the health department may call you. Answer the call to slow the spread. Monitor your symptoms Symptoms of COVID-19 include fever, cough, or other symptoms. Follow care instructions from your healthcare provider and local health department. Your local health authorities may give instructions on checking your symptoms and reporting information. When to seek emergency medical attention Look for emergency warning signs* for COVID-19. If someone is showing any of these signs, seek emergency medical care immediately: Trouble breathing Persistent pain or pressure in the chest New confusion Inability to wake or stay awake Pale, gray, or blue-colored skin, lips, or nail beds, depending on skin tone *This list is not all possible symptoms. Please call your medical provider for any other symptoms that are severe or concerning  to you. Call 911 or call ahead to your local emergency facility: Notify the operator that you are seeking care for someone who has or may have COVID-19. Call ahead before visiting your doctor Call ahead. Many medical visits for routine care are being postponed or done by phone or telemedicine. If you have a medical appointment that cannot be postponed, call your doctor's office, and tell them you have or may have COVID-19. This will help the office protect themselves and other patients. If you are sick, wear a well-fitting mask You should wear a mask if you must be around other people or animals, including pets (even at home). Wear a mask with the best fit, protection, and comfort for you. You don't need to wear the mask if you are alone. If you can't put on a mask (because of  trouble breathing, for example), cover your coughs and sneezes in some other way. Try to stay at least 6 feet away from other people. This will help protect the people around you. Masks should not be placed on young children under age 66 years, anyone who has trouble breathing, or anyone who is not able to remove the mask without help. Cover your coughs and sneezes Cover your mouth and nose with a tissue when you cough or sneeze. Throw away used tissues in a lined trash can. Immediately wash your hands with soap and water for at least 20 seconds. If soap and water are not available, clean your hands with an alcohol-based hand sanitizer that contains at least 60% alcohol. Clean your hands often Wash your hands often with soap and water for at least 20 seconds. This is especially important after blowing your nose, coughing, or sneezing; going to the bathroom; and before eating or preparing food. Use hand sanitizer if soap and water are not available. Use an alcohol-based hand sanitizer with at least 60% alcohol, covering all surfaces of your hands and rubbing them together until they feel dry. Soap and water are the best option, especially if hands are visibly dirty. Avoid touching your eyes, nose, and mouth with unwashed hands. Handwashing Tips Avoid sharing personal household items Do not share dishes, drinking glasses, cups, eating utensils, towels, or bedding with other people in your home. Wash these items thoroughly after using them with soap and water or put in the dishwasher. Clean surfaces in your home regularly Clean and disinfect high-touch surfaces (for example, doorknobs, tables, handles, light switches, and countertops) in your "sick room" and bathroom. In shared spaces, you should clean and disinfect surfaces and items after each use by the person who is ill. If you are sick and cannot clean, a caregiver or other person should only clean and disinfect the area around you (such as your  bedroom and bathroom) on an as needed basis. Your caregiver/other person should wait as long as possible (at least several hours) and wear a mask before entering, cleaning, and disinfecting shared spaces that you use. Clean and disinfect areas that may have blood, stool, or body fluids on them. Use household cleaners and disinfectants. Clean visible dirty surfaces with household cleaners containing soap or detergent. Then, use a household disinfectant. Use a product from [employer] List N: Disinfectants for Coronavirus ([id number]). Be sure to follow the instructions on the label to ensure safe and effective use of the product. Many products recommend keeping the surface wet with a disinfectant for a certain period of time (look at "contact time" on the product label). You may also need  to wear personal protective equipment, such as gloves, depending on the directions on the product label. Immediately after disinfecting, wash your hands with soap and water for 20 seconds. For completed guidance on cleaning and disinfecting your home, visit Complete Disinfection Guidance. Take steps to improve ventilation at home Improve ventilation (air flow) at home to help prevent from spreading COVID-19 to other people in your household. Clear out COVID-19 virus particles in the air by opening windows, using air filters, and turning on fans in your home. Use this interactive tool to learn how to improve air flow in your home. When you can be around others after being sick with COVID-19 Deciding when you can be around others is different for different situations. Find out when you can safely end home isolation. For any additional questions about your care, contact your healthcare provider or state or local health department. 10/06/2020 Content source: [facility] for Immunization and Respiratory Diseases ([facility]), Division of Viral Diseases This information is not intended to replace advice given to you by your health  care provider. Make sure you discuss any questions you have with your health care provider. Document Revised: 11/19/2020 Document Reviewed: 11/19/2020 Elsevier Patient Education  2022 [employer].      If you have been instructed to have an in-person evaluation today at a local Urgent Care facility, please use the link below. It will take you to a list of all of our available [facility], including address, phone number and hours of operation. Please do not delay care.  [facility]  If you or a family member do not have a primary care provider, use the link below to schedule a visit and establish care. When you choose a [facility] primary care physician or advanced practice provider, you gain a long-term partner in health. Find a Primary Care Provider  Learn more about [facility]'s in-office and virtual care options: [facility]

## 2022-09-02 NOTE — Progress Notes (Signed)
Virtual Visit Consent   Katherine [name], you are scheduled for a virtual visit with a [facility] provider today. Just as with appointments in the office, your consent must be obtained to participate. Your consent will be active for this visit and any virtual visit you may have with one of our providers in the next 365 days. If you have a MyChart account, a copy of this consent can be sent to you electronically.  As this is a virtual visit, video technology does not allow for your provider to perform a traditional examination. This may limit your provider's ability to fully assess your condition. If your provider identifies any concerns that need to be evaluated in person or the need to arrange testing (such as labs, EKG, etc.), we will make arrangements to do so. Although advances in technology are sophisticated, we cannot ensure that it will always work on either your end or our end. If the connection with a video visit is poor, the visit may have to be switched to a telephone visit. With either a video or telephone visit, we are not always able to ensure that we have a secure connection.  By engaging in this virtual visit, you consent to the provision of healthcare and authorize for your insurance to be billed (if applicable) for the services provided during this visit. Depending on your insurance coverage, you may receive a charge related to this service.  I need to obtain your verbal consent now. Are you willing to proceed with your visit today? [name] Katherine [name] has provided verbal consent on 09/02/2022 for a virtual visit (video or telephone). Katherine [name], [place]  Date: 09/02/2022 1:08 PM  Virtual Visit via Video Note   I, Katherine [name], connected with  CHERYCE GRANDSTAFF  ([id number], 59/14/1967) on 09/02/22 at  1:00 PM EST by a video-enabled telemedicine application and verified that I am speaking with the correct person using two identifiers.  Location: Patient: Virtual Visit Location Patient:  Home Provider: Virtual Visit Location Provider: Home Office   I discussed the limitations of evaluation and management by telemedicine and the availability of in person appointments. The patient expressed understanding and agreed to proceed.    History of Present Illness: Katherine [name] is a 57 y.o. who identifies as a female who was assigned female at birth, and is being seen today for COVID-19. Husband very sick on Sunday. Noted on Wed she felt slightly under the weather and yesterday was worse. Noting low-grade fever, chills, aches, substantial nasal and head congestion. Some cough and voice hoarseness. Headache as well. Denies SOB or chest pain. Denies GI symptoms. Took at home COVID test today that was positive. 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   Paresthesias 05/09/2017   Dense breast tissue 01/30/2016   Estrogen deficiency 07/11/2015   Vasomotor instability 07/11/2015   History of chicken pox    Atypical lobular hyperplasia of left breast    Vitamin D deficiency 01/01/2014   Other abnormal glucose 12/24/2013   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   Insomnia, persistent 08/28/2013   FATIGUE 03/03/2009   PALPITATIONS 03/03/2009  Anxiety state 11/30/2007   PURE HYPERCHOLESTEROLEMIA 11/29/2007   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   FASCIITIS, PLANTAR 11/29/2007   HEART MURMUR, HX OF 11/29/2007    Allergies:  Allergies  Allergen Reactions   Ultram [Tramadol] Nausea And Vomiting   Codeine Itching and Nausea And Vomiting   Hydrocodone-Acetaminophen     REACTION: NAUSEA   Oxycodone-Acetaminophen      REACTION: NAUSEA   Medications:  Current Outpatient Medications:    Cyanocobalamin (VITAMIN B-12) 2500 MCG SUBL, Place under the tongue daily., Disp: , Rfl:    ibuprofen (ADVIL) 200 MG tablet, Take by mouth., Disp: , Rfl:    Misc Natural Products (NARCOSOFT HERBAL LAX PO), Take by mouth., Disp: , Rfl:    Multiple Vitamins-Minerals (CENTRUM SILVER PO), Take by mouth daily., Disp: , Rfl:    Probiotic Product (PROBIOTIC DAILY PO), Take by mouth., Disp: , Rfl:    zaleplon (SONATA) 10 MG capsule, TAKE 1 CAPSULE BY MOUTH AT BEDTIME AS NEEDED FOR SLEEP, Disp: 30 capsule, Rfl: 2  Observations/Objective: Patient is well-developed, well-nourished in no acute distress.  Resting comfortably at home.  Head is normocephalic, atraumatic.  No labored breathing. Speech is clear and coherent with logical content.  Patient is alert and oriented at baseline.   Assessment and Plan: 1. COVID-19  Patient with multiple risk factors for complicated course of illness. Discussed risks/benefits of antiviral medications including most common potential ADRs. Patient voiced understanding and would like to proceed with antiviral medication. They are candidate for Paxlovid -- for now she elects to monitor symptoms but anything worsening within next 48 hours she can start medication, taking as directed. Rx sent to pharmacy. Supportive measures, OTC medications and vitamin regimen reviewed. Quarantine reviewed in detail. Strict ER precautions discussed with patient.    Follow Up Instructions: I discussed the assessment and treatment plan with the patient. The patient was provided an opportunity to ask questions and all were answered. The patient agreed with the plan and demonstrated an understanding of the instructions.  A copy of instructions were sent to the patient via MyChart unless otherwise noted below.   The patient was advised to call back or seek an in-person evaluation if the symptoms worsen or if the  condition fails to improve as anticipated.  Time:  I spent 10 minutes with the patient via telehealth technology discussing the above problems/concerns.    Katherine Rio, PA-C

## 2022-10-10 ENCOUNTER — Other Ambulatory Visit: Payer: Self-pay | Admitting: Family Medicine

## 2022-10-10 DIAGNOSIS — Z1231 Encounter for screening mammogram for malignant neoplasm of breast: Secondary | ICD-10-CM

## 2022-10-25 ENCOUNTER — Other Ambulatory Visit: Payer: Self-pay | Admitting: Family Medicine

## 2022-10-25 DIAGNOSIS — G4700 Insomnia, unspecified: Secondary | ICD-10-CM

## 2022-11-23 ENCOUNTER — Ambulatory Visit
Admission: RE | Admit: 2022-11-23 | Discharge: 2022-11-23 | Disposition: A | Discharge status: Home / Self Care | Payer: 59 | Source: Ambulatory Visit | Attending: Family Medicine | Admitting: Family Medicine

## 2022-11-23 DIAGNOSIS — Z1231 Encounter for screening mammogram for malignant neoplasm of breast: Secondary | ICD-10-CM

## 2022-12-20 ENCOUNTER — Other Ambulatory Visit: Payer: Self-pay | Admitting: Family Medicine

## 2022-12-20 DIAGNOSIS — G4700 Insomnia, unspecified: Secondary | ICD-10-CM

## 2023-03-16 ENCOUNTER — Other Ambulatory Visit: Payer: Self-pay | Admitting: Family Medicine

## 2023-03-16 DIAGNOSIS — G4700 Insomnia, unspecified: Secondary | ICD-10-CM

## 2023-05-29 ENCOUNTER — Ambulatory Visit (INDEPENDENT_AMBULATORY_CARE_PROVIDER_SITE_OTHER): Payer: 59 | Admitting: Family Medicine

## 2023-05-29 ENCOUNTER — Encounter: Payer: Self-pay | Admitting: Family Medicine

## 2023-05-29 VITALS — BP 122/78 | HR 74 | Temp 98.8°F | Ht 65.0 in | Wt 152.2 lb

## 2023-05-29 DIAGNOSIS — K219 Gastro-esophageal reflux disease without esophagitis: Secondary | ICD-10-CM

## 2023-05-29 DIAGNOSIS — Z13220 Encounter for screening for lipoid disorders: Secondary | ICD-10-CM

## 2023-05-29 DIAGNOSIS — Z0000 Encounter for general adult medical examination without abnormal findings: Secondary | ICD-10-CM

## 2023-05-29 DIAGNOSIS — Z6379 Other stressful life events affecting family and household: Secondary | ICD-10-CM

## 2023-05-29 DIAGNOSIS — M20019 Mallet finger of unspecified finger(s): Secondary | ICD-10-CM

## 2023-05-29 DIAGNOSIS — R300 Dysuria: Secondary | ICD-10-CM

## 2023-05-29 DIAGNOSIS — R1011 Right upper quadrant pain: Secondary | ICD-10-CM

## 2023-05-29 DIAGNOSIS — S62639S Displaced fracture of distal phalanx of unspecified finger, sequela: Secondary | ICD-10-CM

## 2023-05-29 LAB — URINALYSIS, ROUTINE W REFLEX MICROSCOPIC
Bilirubin Urine: NEGATIVE
Hgb urine dipstick: NEGATIVE
Ketones, ur: NEGATIVE
Leukocytes,Ua: NEGATIVE
Nitrite: NEGATIVE
RBC / HPF: NONE SEEN (ref 0–?)
Specific Gravity, Urine: <=1.005 — AB (ref 1.000–1.030)
Total Protein, Urine: NEGATIVE
Urine Glucose: NEGATIVE
Urobilinogen, UA: 0.2 (ref 0.0–1.0)
WBC, UA: NONE SEEN (ref 0–?)
pH: 6.5 (ref 5.0–8.0)

## 2023-05-29 LAB — LIPID PANEL
Cholesterol: 228 mg/dL — ABNORMAL HIGH (ref 0–200)
HDL: 88.7 mg/dL (ref >39.00)
LDL Cholesterol: 128 mg/dL — ABNORMAL HIGH (ref 0–99)
NonHDL: 139.22
Total CHOL/HDL Ratio: 3
Triglycerides: 55 mg/dL (ref 0.0–149.0)
VLDL: 11 mg/dL (ref 0.0–40.0)

## 2023-05-29 LAB — COMPREHENSIVE METABOLIC PANEL
ALT: 16 U/L (ref 0–35)
AST: 21 U/L (ref 0–37)
Albumin: 4.8 g/dL (ref 3.5–5.2)
Alkaline Phosphatase: 81 U/L (ref 39–117)
BUN: 18 mg/dL (ref 6–23)
CO2: 27 meq/L (ref 19–32)
Calcium: 9.8 mg/dL (ref 8.4–10.5)
Chloride: 103 meq/L (ref 96–112)
Creatinine, Ser: 0.85 mg/dL (ref 0.40–1.20)
GFR: 75.07 mL/min (ref >60.00)
Glucose, Bld: 104 mg/dL — ABNORMAL HIGH (ref 70–99)
Potassium: 4.4 meq/L (ref 3.5–5.1)
Sodium: 141 meq/L (ref 135–145)
Total Bilirubin: 0.7 mg/dL (ref 0.2–1.2)
Total Protein: 7.5 g/dL (ref 6.0–8.3)

## 2023-05-29 LAB — POCT URINALYSIS DIPSTICK
Appearance: NORMAL
Bilirubin, UA: NEGATIVE
Blood, UA: NEGATIVE
Glucose, UA: NEGATIVE
Ketones, UA: NEGATIVE
Leukocytes, UA: NEGATIVE
Nitrite, UA: NEGATIVE
Protein, UA: NEGATIVE
Spec Grav, UA: <=1.005 — AB (ref 1.010–1.025)
Urobilinogen, UA: 0.2 U/dL
pH, UA: 6 (ref 5.0–8.0)

## 2023-05-29 LAB — CBC
HCT: 43 % (ref 36.0–46.0)
Hemoglobin: 14.4 g/dL (ref 12.0–15.0)
MCHC: 33.4 g/dL (ref 30.0–36.0)
MCV: 93.2 fL (ref 78.0–100.0)
Platelets: 217 10*3/uL (ref 150.0–400.0)
RBC: 4.61 Mil/uL (ref 3.87–5.11)
RDW: 12.5 % (ref 11.5–15.5)
WBC: 6.2 10*3/uL (ref 4.0–10.5)

## 2023-05-29 LAB — LIPASE: Lipase: 40 U/L (ref 11.0–59.0)

## 2023-05-29 LAB — AMYLASE: Amylase: 59 U/L (ref 27–131)

## 2023-05-29 MED ORDER — ESCITALOPRAM OXALATE 10 MG PO TABS
10.0000 mg | ORAL_TABLET | Freq: Every day | ORAL | 2 refills | Status: DC
Start: 2023-05-29 — End: 2023-07-31

## 2023-05-29 MED ORDER — OMEPRAZOLE 20 MG PO CPDR: 20.0000 mg | DELAYED_RELEASE_CAPSULE | Freq: Every day | ORAL | 3 refills | Status: DC

## 2023-05-29 NOTE — Progress Notes (Signed)
Established Patient Office Visit   Subjective:  Patient ID: Katherine [name], female    DOB: January 13, 1966  Age: 57 y.o. MRN: [id number]  No chief complaint on file.   HPI Encounter Diagnoses  Name Primary?   Healthcare maintenance Yes   Dysuria    Stress due to illness of family member    Right upper quadrant abdominal pain    Gastroesophageal reflux disease, unspecified whether esophagitis present    Closed fracture of distal phalanx of finger with mallet deformity, sequela    Screening for cholesterol level    Presents for a physical with multiple issues and concerns.  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  She has an elderly uncle who lives an hour away In [place] and she is coordinating his care as well as elderly in-laws.  She is also working full-time.  She has been anxious and requiring the Sonata nightly for sleep.  She injured her right third finger sometime ago.  She is unable to extend through the DIP.  She has been having some right upper quadrant discomfort.  She does deal with indigestion a few times weekly.  There has been no nausea or vomiting or weight loss.  Denies constipation or blood in her stool.  Overdue for well woman care through her GYN.   Review of Systems  Constitutional: Negative.   HENT: Negative.    Eyes:  Negative for blurred vision, discharge and redness.  Respiratory: Negative.    Cardiovascular: Negative.   Gastrointestinal:  Negative for abdominal pain.  Genitourinary: Negative.   Musculoskeletal:  Positive for joint pain. Negative for myalgias.  Skin:  Negative for rash.  Neurological:  Negative for tingling, loss of consciousness and weakness.  Endo/Heme/Allergies:  Negative for polydipsia.  Psychiatric/Behavioral:  The patient is nervous/anxious and has insomnia.      Current Outpatient Medications:    Cyanocobalamin (VITAMIN B-12) 2500 MCG SUBL, Place under the tongue  daily., Disp: , Rfl:    escitalopram (LEXAPRO) 10 MG tablet, Take 1 tablet (10 mg total) by mouth daily., Disp: 30 tablet, Rfl: 2   ibuprofen (ADVIL) 200 MG tablet, Take by mouth., Disp: , Rfl:    Misc Natural Products (NARCOSOFT HERBAL LAX PO), Take by mouth., Disp: , Rfl:    Multiple Vitamins-Minerals (CENTRUM SILVER PO), Take by mouth daily., Disp: , Rfl:    omeprazole (PRILOSEC) 20 MG capsule, Take 1 capsule (20 mg total) by mouth daily., Disp: 30 capsule, Rfl: 3   Probiotic Product (PROBIOTIC DAILY PO), Take by mouth., Disp: , Rfl:    zaleplon (SONATA) 10 MG capsule, TAKE 1 CAPSULE BY MOUTH AT BEDTIME AS NEEDED FOR SLEEP, Disp: 30 capsule, Rfl: 1   Objective:     BP 122/78   Pulse 74   Temp 98.8 F (37.1 C)   Ht 5\' 5"  (1.651 m)   Wt 152 lb 3.2 oz (69 kg)   LMP 10/11/2013   SpO2 98%   BMI 25.33 kg/m  BP Readings from Last 3 Encounters:  05/29/23 122/78  07/23/22 (!) 150/89  05/27/22 (!) 142/82   Wt Readings from Last 3 Encounters:  05/29/23 152 lb 3.2 oz (69 kg)  05/27/22 148 lb 6.4 oz (67.3 kg)  11/22/21 149 lb (67.6 kg)      Physical Exam Constitutional:      General: She is not in acute distress.    Appearance: Normal appearance. She is  not ill-appearing, toxic-appearing or diaphoretic.  HENT:     Head: Normocephalic and atraumatic.     Right Ear: External ear normal.     Left Ear: External ear normal.     Mouth/Throat:     Mouth: Mucous membranes are moist.     Pharynx: Oropharynx is clear. No oropharyngeal exudate or posterior oropharyngeal erythema.  Eyes:     General: No scleral icterus.       Right eye: No discharge.        Left eye: No discharge.     Extraocular Movements: Extraocular movements intact.     Conjunctiva/sclera: Conjunctivae normal.     Pupils: Pupils are equal, round, and reactive to light.  Cardiovascular:     Rate and Rhythm: Normal rate and regular rhythm.  Pulmonary:     Effort: Pulmonary effort is normal. No respiratory distress.      Breath sounds: Normal breath sounds. No stridor. No wheezing or rales.  Abdominal:     General: Bowel sounds are normal.     Tenderness: There is no abdominal tenderness. There is no guarding or rebound.  Musculoskeletal:     Cervical back: No rigidity or tenderness.  Lymphadenopathy:     Cervical: No cervical adenopathy.  Skin:    General: Skin is warm and dry.  Neurological:     Mental Status: She is alert and oriented to person, place, and time.  Psychiatric:        Mood and Affect: Mood normal.        Behavior: Behavior normal.      Results for orders placed or performed in visit on 05/29/23  POCT Urinalysis Dipstick  Result Value Ref Range   Color, UA pale    Clarity, UA clear    Glucose, UA Negative Negative   Bilirubin, UA neg    Ketones, UA neg    Spec Grav, UA <=1.005 (A) 1.010 - 1.025   Blood, UA neg    pH, UA 6.0 5.0 - 8.0   Protein, UA Negative Negative   Urobilinogen, UA 0.2 0.2 or 1.0 E.U./dL   Nitrite, UA neg    Leukocytes, UA Negative Negative   Appearance normal    Odor none       The ASCVD Risk score (Arnett DK, et al., 2019) failed to calculate for the following reasons:   The valid HDL cholesterol range is 20 to 100 mg/dL    Assessment & Plan:   Healthcare maintenance  Dysuria -     POCT urinalysis dipstick -     Urinalysis, Routine w reflex microscopic  Stress due to illness of family member -     Escitalopram Oxalate; Take 1 tablet (10 mg total) by mouth daily.  Dispense: 30 tablet; Refill: 2  Right upper quadrant abdominal pain -     US Abdomen Complete; Future -     Omeprazole; Take 1 capsule (20 mg total) by mouth daily.  Dispense: 30 capsule; Refill: 3  Gastroesophageal reflux disease, unspecified whether esophagitis present -     CBC -     Comprehensive metabolic panel -     Amylase -     Lipase -     Omeprazole; Take 1 capsule (20 mg total) by mouth daily.  Dispense: 30 capsule; Refill: 3  Closed fracture of distal  phalanx of finger with mallet deformity, sequela -     Ambulatory referral to Hand Surgery  Screening for cholesterol level -  Lipid panel    Return in about 6 weeks (around 07/10/2023).  Will add Lexapro for anxiety and stress.  Start omeprazole for GERD symptoms.  Abdominal ultrasound.  Referral to hand surgeon finger.  Encouraged exercise as a great stress reliever.  Will follow-up with GYN for well woman care.  Mliss Sax, MD

## 2023-06-02 ENCOUNTER — Other Ambulatory Visit: Payer: Self-pay | Admitting: Family Medicine

## 2023-06-02 DIAGNOSIS — G4700 Insomnia, unspecified: Secondary | ICD-10-CM

## 2023-06-07 ENCOUNTER — Other Ambulatory Visit: Payer: Self-pay | Admitting: Family Medicine

## 2023-06-07 ENCOUNTER — Ambulatory Visit (HOSPITAL_BASED_OUTPATIENT_CLINIC_OR_DEPARTMENT_OTHER)
Admission: RE | Admit: 2023-06-07 | Discharge: 2023-06-07 | Disposition: A | Discharge status: Home / Self Care | Payer: 59 | Source: Ambulatory Visit | Attending: Family Medicine | Admitting: Family Medicine

## 2023-06-07 DIAGNOSIS — R1011 Right upper quadrant pain: Secondary | ICD-10-CM | POA: Insufficient documentation

## 2023-06-07 DIAGNOSIS — G4700 Insomnia, unspecified: Secondary | ICD-10-CM

## 2023-06-25 ENCOUNTER — Ambulatory Visit
Admission: EM | Admit: 2023-06-25 | Discharge: 2023-06-25 | Disposition: A | Discharge status: Home / Self Care | Payer: 59 | Attending: Family Medicine | Admitting: Family Medicine

## 2023-06-25 DIAGNOSIS — R070 Pain in throat: Secondary | ICD-10-CM | POA: Insufficient documentation

## 2023-06-25 DIAGNOSIS — B349 Viral infection, unspecified: Secondary | ICD-10-CM | POA: Diagnosis not present

## 2023-06-25 LAB — POC COVID19/FLU A&B COMBO
Covid Antigen, POC: NEGATIVE
Influenza A Antigen, POC: NEGATIVE
Influenza B Antigen, POC: NEGATIVE

## 2023-06-25 LAB — POCT RAPID STREP A (OFFICE): Rapid Strep A Screen: NEGATIVE

## 2023-06-25 NOTE — Discharge Instructions (Signed)
 We will manage this as a viral illness. For sore throat or cough try using a honey-based tea. Use 3 teaspoons of honey with juice squeezed from half lemon. Place shaved pieces of ginger into 1/2-1 cup of water and warm over stove top. Then mix the ingredients and repeat every 4 hours as needed. Please take ibuprofen 600mg  every 6 hours with food alternating with OR taken together with Tylenol 500mg -650mg  every 6 hours for throat pain, fevers, aches and pains. Hydrate very well with at least 2 liters of water. Eat light meals such as soups (chicken and noodles, vegetable, chicken and wild rice).  Do not eat foods that you are allergic to.  Taking an antihistamine like Zyrtec (10mg  daily) can help against postnasal drainage, sinus congestion which can cause sinus pain, sinus headaches, throat pain, painful swallowing, coughing.  You can take this together with pseudoephedrine (Sudafed) at a dose of 60 mg 3 times a day or twice daily as needed for the same kind of nasal drip, congestion.

## 2023-06-25 NOTE — ED Provider Notes (Signed)
[facility]  Note:  This document was prepared using Conservation officer, historic buildings and may include unintentional dictation errors.  MRN: [id number] DOB: 01-01-1966  Subjective:   Katherine [name] is a 57 y.o. female presenting for 2 day history of significant malaise, fatigue, headaches, throat pain, painful swallowing, chills and body pains.  No chest pain, shob, wheezing. No asthma. No smoking of any kind including cigarettes, cigars, vaping, marijuana use.    No current facility-administered medications for this encounter.  Current Outpatient Medications:    Pseudoeph-Doxylamine-DM-APAP (DAYQUIL/NYQUIL COLD/FLU RELIEF PO), Take by mouth., Disp: , Rfl:    Cyanocobalamin (VITAMIN B-12) 2500 MCG SUBL, Place under the tongue daily., Disp: , Rfl:    escitalopram (LEXAPRO) 10 MG tablet, Take 1 tablet (10 mg total) by mouth daily., Disp: 30 tablet, Rfl: 2   ibuprofen (ADVIL) 200 MG tablet, Take by mouth., Disp: , Rfl:    Misc Natural Products (NARCOSOFT HERBAL LAX PO), Take by mouth., Disp: , Rfl:    Multiple Vitamins-Minerals (CENTRUM SILVER PO), Take by mouth daily., Disp: , Rfl:    omeprazole (PRILOSEC) 20 MG capsule, Take 1 capsule (20 mg total) by mouth daily., Disp: 30 capsule, Rfl: 3   Probiotic Product (PROBIOTIC DAILY PO), Take by mouth., Disp: , Rfl:    zaleplon (SONATA) 10 MG capsule, TAKE 1 CAPSULE BY MOUTH AT BEDTIME AS NEEDED FOR SLEEP, Disp: 30 capsule, Rfl: 0   Allergies  Allergen Reactions   Codeine Itching and Nausea And Vomiting   Hydrocodone-Acetaminophen     REACTION: NAUSEA   Oxycodone-Acetaminophen     REACTION: NAUSEA   Tramadol Nausea And Vomiting    Past Medical History:  Diagnosis Date   Atypical lobular hyperplasia of left breast    Heart murmur    ECHO 2012-trivial regurg-all else normal   History of chicken pox    Insomnia    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     Past Surgical History:  Procedure Laterality Date   BREAST EXCISIONAL BIOPSY Left    BREAST SURGERY  11/20/13   left breast; Dr Preston Fleeting SECTION  1999   plantar fasciitis surgery  2011, 2007   both feet   TONSILLECTOMY AND ADENOIDECTOMY  1981   uterine fibroidectomy     @ c section    Family History  Adopted: Yes  Problem Relation Age of Onset   Heart attack Maternal Uncle 37   Heart disease Maternal Uncle 37       MI   Cervical cancer Maternal Grandmother    Heart Problems Maternal Grandmother        arrhythmia   Cancer Maternal Grandmother        gyn   Stroke Mother        in 52s   Diabetes Mother    Heart disease Mother    Hyperlipidemia Mother    Hyperlipidemia Brother    Cancer Maternal Aunt        numerous breast cancers   Depression Brother        1/2  brother   Hypertension Other    Hypertension Paternal Grandmother    Hypertension Paternal Grandfather     Social History   Tobacco Use   Smoking status: Former    Current packs/day: 0.00    Types: Cigarettes    Quit date: 10/16/1991    Years since quitting: 31.7   Smokeless tobacco: Never   Tobacco comments:    smoked 1981-1993, up to 1 ppd  Vaping Use   Vaping status: Never Used  Substance Use Topics   Alcohol use: Yes    Comment: occasionally    Drug use: No    ROS   Objective:   Vitals: BP (!) 141/87 (BP Location: Left Arm)   Pulse 88   Temp 98.9 F (37.2 C) (Oral)   Resp 18   LMP 10/11/2013   SpO2 96%   Physical Exam Constitutional:      General: She is not in acute distress.    Appearance: Normal appearance. She is well-developed and  normal weight. She is not ill-appearing, toxic-appearing or diaphoretic.  HENT:     Head: Normocephalic and atraumatic.     Right Ear: Tympanic membrane, ear canal and external ear normal. No drainage or tenderness. No middle ear effusion. There is no impacted cerumen. Tympanic membrane is not erythematous or bulging.     Left Ear: Tympanic membrane, ear canal and external ear normal. No drainage or tenderness.  No middle ear effusion. There is no impacted cerumen. Tympanic membrane is not erythematous or bulging.     Nose: No congestion or rhinorrhea.     Mouth/Throat:     Mouth: Mucous membranes are moist. No oral lesions.     Pharynx: Posterior oropharyngeal erythema (with associated post-nasal drainage overlying pharynx) present. No pharyngeal swelling, oropharyngeal exudate or uvula swelling.     Tonsils: No tonsillar exudate or tonsillar abscesses.  Eyes:     General: No scleral icterus.       Right eye: No discharge.        Left eye: No discharge.     Extraocular Movements: Extraocular movements intact.     Right eye: Normal extraocular motion.     Left eye: Normal extraocular motion.     Conjunctiva/sclera: Conjunctivae normal.  Cardiovascular:     Rate and Rhythm: Normal rate.  Pulmonary:     Effort: Pulmonary effort is normal.  Musculoskeletal:     Cervical back: Normal range of motion and neck supple.  Lymphadenopathy:     Cervical: No cervical adenopathy.  Skin:    General: Skin is warm and dry.  Neurological:     General: No focal deficit present.     Mental Status: She is alert and oriented to person, place, and time.  Psychiatric:        Mood and Affect: Mood normal.        Behavior: Behavior normal.     Results for orders placed or performed during the hospital encounter of 06/25/23 (from the past 24 hour(s))  POCT rapid strep A     Status: None   Collection Time: 06/25/23 12:11 PM  Result Value Ref Range   Rapid Strep A Screen Negative Negative  POC Covid +  Flu A/B Antigen     Status: None   Collection Time: 06/25/23 12:40 PM  Result Value Ref Range   Influenza A Antigen, POC Negative Negative   Influenza B Antigen, POC Negative Negative   Covid Antigen, POC Negative Negative    Assessment and  Plan :   PDMP not reviewed this encounter.  1. Acute viral syndrome   2. Throat pain    Strep culture pending.  Suspect viral URI, viral syndrome. Physical exam findings reassuring and vital signs stable for discharge. Advised supportive care, offered symptomatic relief. Counseled patient on potential for adverse effects with medications prescribed/recommended today, ER and return-to-clinic precautions discussed, patient verbalized understanding.     Wallis Bamberg, PA-C 06/25/23 1244

## 2023-06-25 NOTE — ED Triage Notes (Signed)
Pt reports sore throat, headache, chills x 2 days. States started after she finished work and was not abel to stand up from bed until today.  Dayquil and Nyquil gives some relief.

## 2023-06-28 LAB — CULTURE, GROUP A STREP (THRC)

## 2023-06-29 ENCOUNTER — Encounter: Payer: Self-pay | Admitting: Internal Medicine

## 2023-06-29 ENCOUNTER — Ambulatory Visit: Payer: 59 | Admitting: Internal Medicine

## 2023-06-29 VITALS — BP 132/80 | HR 73 | Temp 98.3°F | Ht 65.0 in | Wt 150.6 lb

## 2023-06-29 DIAGNOSIS — H1032 Unspecified acute conjunctivitis, left eye: Secondary | ICD-10-CM

## 2023-06-29 DIAGNOSIS — J0140 Acute pansinusitis, unspecified: Secondary | ICD-10-CM

## 2023-06-29 LAB — POC COVID19 BINAXNOW: SARS Coronavirus 2 Ag: NEGATIVE

## 2023-06-29 MED ORDER — ERYTHROMYCIN 5 MG/GM OP OINT: TOPICAL_OINTMENT | OPHTHALMIC | 0 refills | Status: DC

## 2023-06-29 MED ORDER — AMOXICILLIN-POT CLAVULANATE 875-125 MG PO TABS: 1.0000 | ORAL_TABLET | Freq: Two times a day (BID) | ORAL | 0 refills | Status: AC

## 2023-06-29 NOTE — Patient Instructions (Addendum)
Rest, drink plenty of fluids.  Tylenol for fever, body aches, headache.   For cough: Take Mucinex DM or Robitussin-DM over-the-counter.  Follow the instructions in the box.  For nasal and sinus congestion: Use over-the-counter Flonase: 2 nasal sprays on each side of the nose in the morning until you feel better  Use over-the-counter Astepro 2 nasal sprays on each side of the nose twice daily until better  Over the counter Nedipot or NeilMed Sinus Rinse  If you have been prescribed an antibiotic, take as prescribed.  Call if not gradually better over the next  10 days  Call anytime if the symptoms are severe, you have high fever, short of breath, chest pain

## 2023-06-29 NOTE — Progress Notes (Signed)
[facility] [address] [place] [address] Dept: [phone] Dept Fax: [phone]  Acute Care Office Visit  Subjective:   Katherine [name] Jun 01, 1966 06/29/2023  Chief Complaint  Patient presents with   Cough   Headache   Nasal Congestion    Started a week ago Possible pink eye    HPI: Discussed the use of AI scribe software for clinical note transcription with the patient, who gave verbal consent to proceed.  History of Present Illness   The patient presents with a week-long history of headache, sinus pressure, and cough. The symptoms began abruptly after work last Friday, with the patient describing feeling as if her 'head was going to explode,' accompanied by chills and aches. She spent the entire next day in bed due to the severity of the symptoms. She visited an urgent care center on Sunday 12/8, where she was tested for strep, flu, and COVID-19, all of which were negative. Despite feeling slightly better, she was only able to work on Monday and half of Tuesday before leaving due to the persistence of her symptoms. Her voice has been affected, and while her headache has improved, it is still present. She also reports an issue with her left eye onset 1 day ago, which was matted upon waking up yesterday and is red and irritated. She also reports blowing out a large yellow-green mucus plug from her left nostril. She has been experiencing sinus pressure and a headache, which has improved but is still present. She also reports earache, fullness, and achy teeth. She denies having a fever but reports a lot of coughing and drainage down the back of her throat. She has been taking over-the-counter medications such as Dayquil, Nyquil, and ibuprofen to manage her symptoms.         The following portions of the patient's history were reviewed and updated as appropriate: past medical history, past surgical history, family history,  social history, allergies, medications, and problem list.   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   Paresthesias 05/09/2017   Dense breast tissue 01/30/2016   Estrogen deficiency 07/11/2015   Vasomotor instability 07/11/2015   History of chicken pox    Atypical lobular hyperplasia of left breast    Vitamin D deficiency 01/01/2014   Other abnormal glucose 12/24/2013   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   Insomnia, persistent 08/28/2013   FATIGUE 03/03/2009   PALPITATIONS 03/03/2009   Anxiety state 11/30/2007   PURE HYPERCHOLESTEROLEMIA 11/29/2007   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   FASCIITIS, PLANTAR 11/29/2007   HEART MURMUR, HX OF 11/29/2007   Past Medical History:  Diagnosis Date   Atypical lobular hyperplasia of left breast    Heart murmur    ECHO 2012-trivial regurg-all else normal   History of chicken pox    Insomnia    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   Past Surgical History:  Procedure Laterality Date   BREAST EXCISIONAL BIOPSY Left    BREAST SURGERY  11/20/13   left breast; Dr Preston Fleeting SECTION  1999   plantar fasciitis surgery  2011, 2007   both feet   TONSILLECTOMY AND ADENOIDECTOMY  1981   uterine fibroidectomy     @ c  section   Family History  Adopted: Yes  Problem Relation Age of Onset   Heart attack Maternal Uncle 37   Heart disease Maternal Uncle 37       MI   Cervical cancer Maternal Grandmother    Heart Problems Maternal Grandmother        arrhythmia   Cancer Maternal Grandmother        gyn   Stroke Mother        in 64s   Diabetes Mother    Heart disease Mother    Hyperlipidemia Mother    Hyperlipidemia Brother    Cancer Maternal Aunt        numerous breast cancers   Depression Brother        1/2 brother   Hypertension Other    Hypertension Paternal Grandmother    Hypertension Paternal Grandfather     Current Outpatient Medications:    amoxicillin-clavulanate (AUGMENTIN) 875-125 MG tablet, Take 1 tablet by mouth 2 (two) times daily for 7 days., Disp: 14 tablet, Rfl: 0   Cyanocobalamin (VITAMIN B-12) 2500 MCG SUBL, Place under the tongue daily., Disp: , Rfl:    erythromycin ophthalmic ointment, Instill ~1cm ribbon into affected eye(s) 4 times a day for 7 days, Disp: 3.5 g, Rfl: 0   escitalopram (LEXAPRO) 10 MG tablet, Take 1 tablet (10 mg total) by mouth daily., Disp: 30 tablet, Rfl: 2   ibuprofen (ADVIL) 200 MG tablet, Take by mouth., Disp: , Rfl:    Misc Natural Products (NARCOSOFT HERBAL LAX PO), Take by mouth., Disp: , Rfl:    Multiple Vitamins-Minerals (CENTRUM SILVER PO), Take by mouth daily., Disp: , Rfl:    omeprazole (PRILOSEC) 20 MG capsule, Take 1 capsule (20 mg total) by mouth daily., Disp: 30 capsule, Rfl: 3   zaleplon (SONATA) 10 MG capsule, TAKE 1 CAPSULE BY MOUTH AT BEDTIME AS NEEDED FOR SLEEP, Disp: 30 capsule, Rfl: 0   Probiotic Product (PROBIOTIC DAILY PO), Take by mouth., Disp: , Rfl:    Pseudoeph-Doxylamine-DM-APAP (DAYQUIL/NYQUIL COLD/FLU RELIEF PO), Take by mouth., Disp: , Rfl:  Allergies  Allergen Reactions   Codeine Itching and Nausea And Vomiting   Hydrocodone-Acetaminophen     REACTION: NAUSEA   Oxycodone-Acetaminophen     REACTION: NAUSEA   Tramadol  Nausea And Vomiting     ROS: A complete ROS was performed with pertinent positives/negatives noted in the HPI. The remainder of the ROS are negative.    Objective:   Today's Vitals   06/29/23 1045  BP: 132/80  Pulse: 73  Temp: 98.3 F (36.8 C)  TempSrc: Temporal  SpO2: 99%  Weight: 150 lb 9.6 oz (68.3 kg)  Height: 5\' 5"  (1.651 m)    GENERAL: Well-appearing, in NAD. Well nourished.  SKIN: Pink, warm and dry. No rash. HEENT:    HEAD: Normocephalic, non-traumatic.  EYES: Left sclera injected with discharge EARS: External ear w/o redness, swelling, masses, or lesions. EAC clear. TM's intact, hazy with bulging, no erythema. appropriate landmarks visualized.  NOSE: Septum midline w/o deformity. Nares patent,  mucosa pink and non-inflamed w/o drainage. No sinus tenderness.  THROAT: Uvula midline. Oropharynx erythematous with yellow PND. Tonsils absent. Mucus membranes pink and moist.  NECK: Trachea midline. Full ROM w/o pain or tenderness. Anterior cervical chain lymphadenopathy RESPIRATORY: Chest wall symmetrical. Respirations even and non-labored. Breath sounds clear to auscultation bilaterally.  CARDIAC: S1, S2 present, regular rate and rhythm. Peripheral pulses 2+ bilaterally.  EXTREMITIES: Without clubbing, cyanosis, or edema.  NEUROLOGIC: Steady, even gait.  PSYCH/MENTAL STATUS: Alert, oriented x 3. Cooperative, appropriate mood and affect.    Results for orders placed or performed in visit on 06/29/23  POC COVID-19 BinaxNow  Result Value Ref Range   SARS Coronavirus 2 Ag Negative Negative      Assessment & Plan:  Assessment and Plan    Acute Sinusitis Symptoms of headache, sinus pressure, ear fullness, toothache, and yellow-green nasal discharge for one week. Negative for fever and chest symptoms. Improvement noted in symptoms but still present. - recheck COVID test due to initial possible false negative.  -Start Augmentin 875-125mg  PO BID for 7 days. -Continue  over-the-counter Flonase, Astepro nasal spray, Mucinex DM, and neti pot or NeilMed sinus rinses. -Follow up if symptoms worsen or fail to improve.  Bacterial Conjunctivitis Left eye redness, irritation, and matting noted for one day. -Start erythromycin ointment QID for 7 days.      Meds ordered this encounter  Medications   erythromycin ophthalmic ointment    Sig: Instill ~1cm ribbon into affected eye(s) 4 times a day for 7 days    Dispense:  3.5 g    Refill:  0    Supervising Provider:   Garnette Gunner [[id number]]   amoxicillin-clavulanate (AUGMENTIN) 875-125 MG tablet    Sig: Take 1 tablet by mouth 2 (two) times daily for 7 days.    Dispense:  14 tablet    Refill:  0    Supervising Provider:   Garnette Gunner [[id number]]   Orders Placed This Encounter  Procedures   POC COVID-19 BinaxNow    Previously tested for COVID-19:   No    Resident in a congregate (group) care setting:   No    Employed in healthcare setting:   No    Pregnant:   No   Lab Orders         POC COVID-19 BinaxNow     No images are attached to the encounter or orders placed in the encounter.  Return if symptoms worsen or fail to improve.   Salvatore Decent, FNP

## 2023-07-14 ENCOUNTER — Ambulatory Visit: Payer: 59 | Admitting: Family Medicine

## 2023-07-31 ENCOUNTER — Ambulatory Visit (INDEPENDENT_AMBULATORY_CARE_PROVIDER_SITE_OTHER): Payer: 59 | Admitting: Family Medicine

## 2023-07-31 ENCOUNTER — Encounter: Payer: Self-pay | Admitting: Family Medicine

## 2023-07-31 VITALS — BP 120/72 | HR 48 | Temp 97.3°F | Ht 65.0 in | Wt 154.6 lb

## 2023-07-31 DIAGNOSIS — G4700 Insomnia, unspecified: Secondary | ICD-10-CM | POA: Diagnosis not present

## 2023-07-31 DIAGNOSIS — F411 Generalized anxiety disorder: Secondary | ICD-10-CM

## 2023-07-31 MED ORDER — ESCITALOPRAM OXALATE 20 MG PO TABS: 20.0000 mg | ORAL_TABLET | Freq: Every day | ORAL | 1 refills | Status: DC

## 2023-07-31 MED ORDER — ZALEPLON 10 MG PO CAPS: ORAL_CAPSULE | ORAL | 1 refills | Status: DC

## 2023-07-31 NOTE — Progress Notes (Signed)
 Established Patient Office Visit   Subjective:  Patient ID: Katherine [name], female    DOB: February 09, 1966  Age: 58 y.o. MRN: [id number]  Chief Complaint  Patient presents with   Medical Management of Chronic Issues    6 week follow up.    HPI Encounter Diagnoses  Name Primary?   Anxiety state Yes   Insomnia, persistent    For follow-up of above.  Things are better with the Lexapro  but stress continues between her regular job and time spent caring for 2 elderly relatives 1 of which lives in [place].  Sonata  is helpful.    Review of Systems  Constitutional: Negative.   HENT: Negative.    Eyes:  Negative for blurred vision, discharge and redness.  Respiratory: Negative.    Cardiovascular: Negative.   Gastrointestinal:  Negative for abdominal pain.  Genitourinary: Negative.   Musculoskeletal: Negative.  Negative for myalgias.  Skin:  Negative for rash.  Neurological:  Negative for tingling, loss of consciousness and weakness.  Endo/Heme/Allergies:  Negative for polydipsia.  Psychiatric/Behavioral:  Negative for suicidal ideas. The patient is nervous/anxious and has insomnia.      Current Outpatient Medications:    Cyanocobalamin (VITAMIN B-12) 2500 MCG SUBL, Place under the tongue daily., Disp: , Rfl:    erythromycin  ophthalmic ointment, Instill ~1cm ribbon into affected eye(s) 4 times a day for 7 days, Disp: 3.5 g, Rfl: 0   escitalopram  (LEXAPRO ) 20 MG tablet, Take 1 tablet (20 mg total) by mouth daily., Disp: 90 tablet, Rfl: 1   ibuprofen (ADVIL) 200 MG tablet, Take by mouth., Disp: , Rfl:    Misc Natural Products (NARCOSOFT HERBAL LAX PO), Take by mouth., Disp: , Rfl:    Multiple Vitamins-Minerals (CENTRUM SILVER PO), Take by mouth daily., Disp: , Rfl:    omeprazole  (PRILOSEC) 20 MG capsule, Take 1 capsule (20 mg total) by mouth daily., Disp: 30 capsule, Rfl: 3   Probiotic Product (PROBIOTIC DAILY PO), Take by mouth., Disp: , Rfl:    Pseudoeph-Doxylamine-DM-APAP (DAYQUIL/NYQUIL  COLD/FLU RELIEF PO), Take by mouth., Disp: , Rfl:    zaleplon  (SONATA ) 10 MG capsule, TAKE 1 CAPSULE BY MOUTH AT BEDTIME AS NEEDED FOR SLEEP, Disp: 90 capsule, Rfl: 1   Objective:     BP 120/72   Pulse (!) 48   Temp (!) 97.3 F (36.3 C)   Ht 5' 5 (1.651 m)   Wt 154 lb 9.6 oz (70.1 kg)   LMP 10/11/2013   SpO2 95%   BMI 25.73 kg/m    Physical Exam Constitutional:      General: She is not in acute distress.    Appearance: Normal appearance. She is not ill-appearing, toxic-appearing or diaphoretic.  HENT:     Head: Normocephalic and atraumatic.     Right Ear: External ear normal.     Left Ear: External ear normal.  Eyes:     General: No scleral icterus.       Right eye: No discharge.        Left eye: No discharge.     Extraocular Movements: Extraocular movements intact.     Conjunctiva/sclera: Conjunctivae normal.  Pulmonary:     Effort: Pulmonary effort is normal. No respiratory distress.  Skin:    General: Skin is warm and dry.  Neurological:     Mental Status: She is alert and oriented to person, place, and time.  Psychiatric:        Mood and Affect: Mood normal.  Behavior: Behavior normal.      No results found for any visits on 07/31/23.    The 10-year ASCVD risk score (Arnett DK, et al., 2019) is: 2.1%    Assessment & Plan:   Anxiety state -     Escitalopram  Oxalate; Take 1 tablet (20 mg total) by mouth daily.  Dispense: 90 tablet; Refill: 1  Insomnia, persistent -     Zaleplon ; TAKE 1 CAPSULE BY MOUTH AT BEDTIME AS NEEDED FOR SLEEP  Dispense: 90 capsule; Refill: 1    Return in about 3 months (around 10/29/2023).  Have increased Lexapro  to 20 mg daily.  Sonata  at at bedtime as needed.  Continue healthy active lifestyle with exercise.  Will be due for colonoscopy on a 10-year follow-up this year.  She does see [facility] for these.  Katherine Sim Lent, MD

## 2023-08-27 ENCOUNTER — Other Ambulatory Visit: Payer: Self-pay | Admitting: Family Medicine

## 2023-08-27 DIAGNOSIS — Z6379 Other stressful life events affecting family and household: Secondary | ICD-10-CM

## 2023-10-01 ENCOUNTER — Other Ambulatory Visit: Payer: Self-pay | Admitting: Family Medicine

## 2023-10-01 DIAGNOSIS — K219 Gastro-esophageal reflux disease without esophagitis: Secondary | ICD-10-CM

## 2023-10-01 DIAGNOSIS — R1011 Right upper quadrant pain: Secondary | ICD-10-CM

## 2023-10-30 ENCOUNTER — Ambulatory Visit: Payer: 59 | Admitting: Family Medicine

## 2023-11-20 ENCOUNTER — Ambulatory Visit (INDEPENDENT_AMBULATORY_CARE_PROVIDER_SITE_OTHER): Admitting: Family Medicine

## 2023-11-20 ENCOUNTER — Encounter: Payer: Self-pay | Admitting: Family Medicine

## 2023-11-20 VITALS — BP 124/76 | HR 58 | Temp 98.0°F | Ht 65.0 in | Wt 150.8 lb

## 2023-11-20 DIAGNOSIS — G4700 Insomnia, unspecified: Secondary | ICD-10-CM | POA: Diagnosis not present

## 2023-11-20 DIAGNOSIS — F411 Generalized anxiety disorder: Secondary | ICD-10-CM

## 2023-11-20 NOTE — Progress Notes (Signed)
 Established Patient Office Visit   Subjective:  Patient ID: Katherine [name], female    DOB: [birth year]  Age: 58 y.o. MRN: [id number]  Chief Complaint  Patient presents with   Anxiety    Follow up n Anxiety. Pt request a decrease on Lexapro  pt wants to go back down to 10mg .     Anxiety     Encounter Diagnoses  Name Primary?   Anxiety state Yes   Insomnia, persistent    For follow-up of anxiety with insomnia.  Did not tolerate the 20 mg of Lexapro  well but 10 mg is helping her anxiety a great deal.  She is also exercising by going to the gym and walking her dog Rosco.  Continues to travel to [place] regularly to help care for her maternal uncle.  Father-in-law and mother-in-law live locally and she is also involved with their care along with her husband.  Continues to work full-time at the [employer].   Review of Systems  Constitutional: Negative.   HENT: Negative.    Eyes:  Negative for blurred vision, discharge and redness.  Respiratory: Negative.    Cardiovascular: Negative.   Gastrointestinal:  Negative for abdominal pain.  Genitourinary: Negative.   Musculoskeletal: Negative.  Negative for myalgias.  Skin:  Negative for rash.  Neurological:  Negative for tingling, loss of consciousness and weakness.  Endo/Heme/Allergies:  Negative for polydipsia.     Current Outpatient Medications:    Cyanocobalamin (VITAMIN B-12) 2500 MCG SUBL, Place under the tongue daily., Disp: , Rfl:    erythromycin  ophthalmic ointment, Instill ~1cm ribbon into affected eye(s) 4 times a day for 7 days, Disp: 3.5 g, Rfl: 0   escitalopram  (LEXAPRO ) 20 MG tablet, Take 1 tablet (20 mg total) by mouth daily., Disp: 90 tablet, Rfl: 1   ibuprofen (ADVIL) 200 MG tablet, Take by mouth., Disp: , Rfl:    Misc Natural Products (NARCOSOFT HERBAL LAX PO), Take by mouth., Disp: , Rfl:    Multiple Vitamins-Minerals (CENTRUM SILVER PO), Take by mouth daily., Disp: , Rfl:    omeprazole  (PRILOSEC) 20 MG capsule,  TAKE 1 CAPSULE(20 MG) BY MOUTH DAILY, Disp: 30 capsule, Rfl: 3   zaleplon  (SONATA ) 10 MG capsule, TAKE 1 CAPSULE BY MOUTH AT BEDTIME AS NEEDED FOR SLEEP, Disp: 90 capsule, Rfl: 1   Objective:     BP 124/76 (BP Location: Right Arm, Patient Position: Sitting, Cuff Size: Normal)   Pulse (!) 58   Temp 98 F (36.7 C) (Temporal)   Ht 5\' 5"  (1.651 m)   Wt 150 lb 12.8 oz (68.4 kg)   LMP 10/11/2013   SpO2 99%   BMI 25.09 kg/m    Physical Exam Constitutional:      General: She is not in acute distress.    Appearance: Normal appearance. She is not ill-appearing, toxic-appearing or diaphoretic.  HENT:     Head: Normocephalic and atraumatic.     Right Ear: External ear normal.     Left Ear: External ear normal.  Eyes:     General: No scleral icterus.       Right eye: No discharge.        Left eye: No discharge.     Extraocular Movements: Extraocular movements intact.     Conjunctiva/sclera: Conjunctivae normal.  Pulmonary:     Effort: Pulmonary effort is normal. No respiratory distress.  Skin:    General: Skin is warm and dry.  Neurological:     Mental Status: She is alert and  oriented to person, place, and time.  Psychiatric:        Mood and Affect: Mood normal.        Behavior: Behavior normal.      No results found for any visits on 11/20/23.    The 10-year ASCVD risk score (Arnett DK, et al., 2019) is: 2.2%    Assessment & Plan:   Anxiety state  Insomnia, persistent    Return in about 6 months (around 05/22/2024) for annual physical.  Continue Lexapro  10 mg daily and Sonata  nightly as needed.  Tonna Frederic, MD

## 2023-12-25 ENCOUNTER — Other Ambulatory Visit: Payer: Self-pay | Admitting: Family Medicine

## 2023-12-25 DIAGNOSIS — Z1231 Encounter for screening mammogram for malignant neoplasm of breast: Secondary | ICD-10-CM

## 2024-01-18 ENCOUNTER — Ambulatory Visit
Admission: RE | Admit: 2024-01-18 | Discharge: 2024-01-18 | Disposition: A | Discharge status: Home / Self Care | Source: Ambulatory Visit | Attending: Family Medicine | Admitting: Family Medicine

## 2024-01-18 DIAGNOSIS — Z1231 Encounter for screening mammogram for malignant neoplasm of breast: Secondary | ICD-10-CM

## 2024-02-12 ENCOUNTER — Ambulatory Visit (HOSPITAL_COMMUNITY)

## 2024-02-13 ENCOUNTER — Other Ambulatory Visit (HOSPITAL_COMMUNITY): Payer: Self-pay | Admitting: Family Medicine

## 2024-02-13 ENCOUNTER — Ambulatory Visit (HOSPITAL_COMMUNITY)
Admission: RE | Admit: 2024-02-13 | Discharge: 2024-02-13 | Disposition: A | Discharge status: Home / Self Care | Source: Ambulatory Visit | Attending: Family Medicine | Admitting: Family Medicine

## 2024-02-13 DIAGNOSIS — M79604 Pain in right leg: Secondary | ICD-10-CM | POA: Insufficient documentation

## 2024-03-07 ENCOUNTER — Other Ambulatory Visit: Payer: Self-pay | Admitting: Family Medicine

## 2024-03-07 DIAGNOSIS — G4700 Insomnia, unspecified: Secondary | ICD-10-CM

## 2024-03-12 ENCOUNTER — Encounter: Payer: Self-pay | Admitting: Family Medicine

## 2024-03-13 ENCOUNTER — Other Ambulatory Visit: Payer: Self-pay | Admitting: Family

## 2024-03-13 DIAGNOSIS — G4700 Insomnia, unspecified: Secondary | ICD-10-CM

## 2024-03-13 MED ORDER — ZALEPLON 10 MG PO CAPS: ORAL_CAPSULE | ORAL | 0 refills | Status: AC

## 2024-03-13 MED ORDER — ZALEPLON 10 MG PO CAPS: ORAL_CAPSULE | ORAL | 0 refills | Status: DC

## 2024-03-13 NOTE — Telephone Encounter (Signed)
 Requesting: Zaleplon  (Sonata ) 10 mg Last Visit: 11/20/2023 Next Visit: 05/30/2024 Last Refill: 03/08/24  Please Advise

## 2024-03-17 ENCOUNTER — Other Ambulatory Visit: Payer: Self-pay | Admitting: Family Medicine

## 2024-03-17 DIAGNOSIS — R1011 Right upper quadrant pain: Secondary | ICD-10-CM

## 2024-03-17 DIAGNOSIS — K219 Gastro-esophageal reflux disease without esophagitis: Secondary | ICD-10-CM

## 2024-04-09 ENCOUNTER — Other Ambulatory Visit: Payer: Self-pay | Admitting: Family Medicine

## 2024-04-09 DIAGNOSIS — F411 Generalized anxiety disorder: Secondary | ICD-10-CM

## 2024-05-30 ENCOUNTER — Encounter: Payer: Self-pay | Admitting: Family Medicine

## 2024-05-30 ENCOUNTER — Ambulatory Visit (INDEPENDENT_AMBULATORY_CARE_PROVIDER_SITE_OTHER): Admitting: Family Medicine

## 2024-05-30 VITALS — BP 124/80 | HR 71 | Temp 97.6°F | Ht 65.0 in | Wt 155.4 lb

## 2024-05-30 DIAGNOSIS — Z0000 Encounter for general adult medical examination without abnormal findings: Secondary | ICD-10-CM

## 2024-05-30 DIAGNOSIS — Z13220 Encounter for screening for lipoid disorders: Secondary | ICD-10-CM

## 2024-05-30 DIAGNOSIS — Z1211 Encounter for screening for malignant neoplasm of colon: Secondary | ICD-10-CM | POA: Diagnosis not present

## 2024-05-30 DIAGNOSIS — F411 Generalized anxiety disorder: Secondary | ICD-10-CM | POA: Diagnosis not present

## 2024-05-30 DIAGNOSIS — Z131 Encounter for screening for diabetes mellitus: Secondary | ICD-10-CM | POA: Diagnosis not present

## 2024-05-30 MED ORDER — ESCITALOPRAM OXALATE 10 MG PO TABS: 10.0000 mg | ORAL_TABLET | Freq: Every day | ORAL | 3 refills | Status: AC

## 2024-05-30 NOTE — Progress Notes (Signed)
 Established Patient Office Visit   Subjective:  Patient ID: Katherine [name], female    DOB: 13-Sep-1965  Age: 58 y.o. MRN: [id number]  Chief Complaint  Patient presents with   Annual Exam    Patient presents today for her physical. Pt wants to have her knee replaced Dec 9th and Dr. Hiram is needing a surgical clearance from PCP. Not fasting will come back tm morning for labs     HPI Encounter Diagnoses  Name Primary?   Healthcare maintenance Yes   Anxiety state    Screening for colon cancer    Screening for diabetes mellitus    Screening for cholesterol level    For physical today.  Right knee replacement scheduled for 12/9.  She remains active physically by walking and has regular dental care.  She has also been going to the gym and working on runner, broadcasting/film/video.  Well woman care through GYN.  Due for colonoscopy.  Works full-time as an [employer] for [employer].  She also helps to care for 3-4 invalid family members.   Review of Systems  Constitutional: Negative.   HENT: Negative.    Eyes:  Negative for blurred vision, discharge and redness.  Respiratory: Negative.    Cardiovascular: Negative.   Gastrointestinal:  Negative for abdominal pain.  Genitourinary: Negative.   Musculoskeletal: Negative.  Negative for myalgias.  Skin:  Negative for rash.  Neurological:  Negative for tingling, loss of consciousness and weakness.  Endo/Heme/Allergies:  Negative for polydipsia.     Current Outpatient Medications:    Cyanocobalamin (VITAMIN B-12) 2500 MCG SUBL, Place under the tongue daily., Disp: , Rfl:    ibuprofen (ADVIL) 200 MG tablet, Take by mouth., Disp: , Rfl:    Misc Natural Products (NARCOSOFT HERBAL LAX PO), Take by mouth., Disp: , Rfl:    Multiple Vitamins-Minerals (CENTRUM SILVER PO), Take by mouth daily., Disp: , Rfl:    omeprazole  (PRILOSEC) 20 MG capsule, TAKE 1 CAPSULE(20 MG) BY MOUTH DAILY, Disp: 90 capsule, Rfl: 0   zaleplon  (SONATA ) 10 MG capsule, TAKE 1 CAPSULE BY  MOUTH EVERY DAY AT BEDTIME, Disp: 90 capsule, Rfl: 0   escitalopram  (LEXAPRO ) 10 MG tablet, Take 1 tablet (10 mg total) by mouth daily., Disp: 90 tablet, Rfl: 3   Objective:     BP 124/80   Pulse 71   Temp 97.6 F (36.4 C)   Ht 5' 5 (1.651 m)   Wt 155 lb 6.4 oz (70.5 kg)   LMP 10/11/2013   SpO2 94%   BMI 25.86 kg/m  Wt Readings from Last 3 Encounters:  05/30/24 155 lb 6.4 oz (70.5 kg)  11/20/23 150 lb 12.8 oz (68.4 kg)  07/31/23 154 lb 9.6 oz (70.1 kg)      Physical Exam Constitutional:      General: She is not in acute distress.    Appearance: Normal appearance. She is not ill-appearing, toxic-appearing or diaphoretic.  HENT:     Head: Normocephalic and atraumatic.     Right Ear: Tympanic membrane, ear canal and external ear normal.     Left Ear: Tympanic membrane, ear canal and external ear normal.     Mouth/Throat:     Mouth: Mucous membranes are moist.     Pharynx: Oropharynx is clear. No oropharyngeal exudate or posterior oropharyngeal erythema.  Eyes:     General: No scleral icterus.       Right eye: No discharge.        Left eye: No discharge.  Extraocular Movements: Extraocular movements intact.     Conjunctiva/sclera: Conjunctivae normal.     Pupils: Pupils are equal, round, and reactive to light.  Cardiovascular:     Rate and Rhythm: Normal rate and regular rhythm.  Pulmonary:     Effort: Pulmonary effort is normal. No respiratory distress.     Breath sounds: Normal breath sounds. No wheezing or rales.  Abdominal:     General: Bowel sounds are normal.  Musculoskeletal:     Cervical back: No rigidity or tenderness.  Lymphadenopathy:     Cervical: No cervical adenopathy.  Skin:    General: Skin is warm and dry.  Neurological:     Mental Status: She is alert and oriented to person, place, and time.  Psychiatric:        Mood and Affect: Mood normal.        Behavior: Behavior normal.      No results found for any visits on 05/30/24.    The  10-year ASCVD risk score (Arnett DK, et al., 2019) is: 2.5%    Assessment & Plan:   Healthcare maintenance -     CBC with Differential/Platelet -     Urinalysis, Routine w reflex microscopic  Anxiety state -     Escitalopram  Oxalate; Take 1 tablet (10 mg total) by mouth daily.  Dispense: 90 tablet; Refill: 3  Screening for colon cancer -     Ambulatory referral to Gastroenterology  Screening for diabetes mellitus -     Comprehensive metabolic panel with GFR -     Hemoglobin A1c  Screening for cholesterol level -     Comprehensive metabolic panel with GFR -     Lipid panel    Return in about 1 year (around 05/30/2025), or if symptoms worsen or fail to improve.  Will continue active lifestyle.  Is determined to make healthy food choices.  Information was given on health maintenance and disease prevention.   Elsie Sim Lent, MD

## 2024-05-31 ENCOUNTER — Other Ambulatory Visit

## 2024-05-31 DIAGNOSIS — Z13220 Encounter for screening for lipoid disorders: Secondary | ICD-10-CM

## 2024-05-31 LAB — CBC WITH DIFFERENTIAL/PLATELET
Basophils Absolute: 0 K/uL (ref 0.0–0.1)
Basophils Relative: 0.7 % (ref 0.0–3.0)
Eosinophils Absolute: 0.1 K/uL (ref 0.0–0.7)
Eosinophils Relative: 1.3 % (ref 0.0–5.0)
HCT: 40.9 % (ref 36.0–46.0)
Hemoglobin: 13.8 g/dL (ref 12.0–15.0)
Lymphocytes Relative: 18.4 % (ref 12.0–46.0)
Lymphs Abs: 1.1 K/uL (ref 0.7–4.0)
MCHC: 33.8 g/dL (ref 30.0–36.0)
MCV: 92.3 fl (ref 78.0–100.0)
Monocytes Absolute: 0.7 K/uL (ref 0.1–1.0)
Monocytes Relative: 12 % (ref 3.0–12.0)
Neutro Abs: 3.9 K/uL (ref 1.4–7.7)
Neutrophils Relative %: 67.6 % (ref 43.0–77.0)
Platelets: 257 K/uL (ref 150.0–400.0)
RBC: 4.43 Mil/uL (ref 3.87–5.11)
RDW: 12.7 % (ref 11.5–15.5)
WBC: 5.7 K/uL (ref 4.0–10.5)

## 2024-05-31 LAB — COMPREHENSIVE METABOLIC PANEL WITH GFR
ALT: 22 U/L (ref 0–35)
AST: 26 U/L (ref 0–37)
Albumin: 4.7 g/dL (ref 3.5–5.2)
Alkaline Phosphatase: 73 U/L (ref 39–117)
BUN: 19 mg/dL (ref 6–23)
CO2: 28 meq/L (ref 19–32)
Calcium: 9.6 mg/dL (ref 8.4–10.5)
Chloride: 100 meq/L (ref 96–112)
Creatinine, Ser: 0.91 mg/dL (ref 0.40–1.20)
GFR: 68.68 mL/min (ref >60.00)
Glucose, Bld: 97 mg/dL (ref 70–99)
Potassium: 4 meq/L (ref 3.5–5.1)
Sodium: 139 meq/L (ref 135–145)
Total Bilirubin: 0.8 mg/dL (ref 0.2–1.2)
Total Protein: 7.6 g/dL (ref 6.0–8.3)

## 2024-05-31 LAB — LIPID PANEL
Cholesterol: 224 mg/dL — ABNORMAL HIGH (ref 0–200)
HDL: 83.4 mg/dL (ref >39.00)
LDL Cholesterol: 126 mg/dL — ABNORMAL HIGH (ref 0–99)
NonHDL: 140.68
Total CHOL/HDL Ratio: 3
Triglycerides: 73 mg/dL (ref 0.0–149.0)
VLDL: 14.6 mg/dL (ref 0.0–40.0)

## 2024-05-31 LAB — HEMOGLOBIN A1C: Hgb A1c MFr Bld: 5.4 % (ref 4.6–6.5)

## 2024-06-09 ENCOUNTER — Ambulatory Visit: Payer: Self-pay | Admitting: Family Medicine

## 2024-08-14 ENCOUNTER — Encounter: Payer: Self-pay | Admitting: Gastroenterology

## 2024-08-15 ENCOUNTER — Encounter: Payer: Self-pay | Admitting: Gastroenterology

## 2024-08-15 ENCOUNTER — Ambulatory Visit

## 2024-08-15 VITALS — Ht 65.0 in | Wt 153.0 lb

## 2024-08-15 DIAGNOSIS — Z1211 Encounter for screening for malignant neoplasm of colon: Secondary | ICD-10-CM

## 2024-08-15 MED ORDER — NA SULFATE-K SULFATE-MG SULF 17.5-3.13-1.6 GM/177ML PO SOLN: 1.0000 | Freq: Once | ORAL | 0 refills | Status: AC

## 2024-08-15 NOTE — Progress Notes (Signed)
 No egg or soy allergy known to patient  No issues known to pt with past sedation with any surgeries or procedures Patient denies ever being told they had issues or difficulty with intubation  No FH of Malignant Hyperthermia Pt is not on diet pills Pt is not on  home 02  Pt is not on blood thinners  Pt denies issues with constipation  No A fib or A flutter Have any cardiac testing pending--No Pt can ambulate  Pt denies use of chewing tobacco Discussed diabetic I weight loss medication holds Discussed NSAID holds Checked BMI Pt instructed to use [employer] or GoodRx for a price reduction on prep  Patient's chart reviewed.  Pre visit completed

## 2024-08-29 ENCOUNTER — Encounter: Admitting: Gastroenterology

## 2024-11-01 ENCOUNTER — Encounter: Admitting: Family Medicine

## 2025-06-02 ENCOUNTER — Encounter: Admitting: Family Medicine

## 2025-06-05 ENCOUNTER — Encounter: Admitting: Family Medicine
